# Patient Record
Sex: Male | Born: 1935 | Race: White | Hispanic: No | Smoking: Former smoker
Health system: Southern US, Community
[De-identification: ages and names within clinical notes are randomized; demographics above are authoritative.]

## PROBLEM LIST (undated history)

## (undated) DIAGNOSIS — I5030 Unspecified diastolic (congestive) heart failure: Secondary | ICD-10-CM

## (undated) DIAGNOSIS — J189 Pneumonia, unspecified organism: Secondary | ICD-10-CM

## (undated) DIAGNOSIS — G039 Meningitis, unspecified: Secondary | ICD-10-CM

## (undated) DIAGNOSIS — E662 Morbid (severe) obesity with alveolar hypoventilation: Secondary | ICD-10-CM

## (undated) DIAGNOSIS — I10 Essential (primary) hypertension: Secondary | ICD-10-CM

## (undated) DIAGNOSIS — N189 Chronic kidney disease, unspecified: Secondary | ICD-10-CM

## (undated) DIAGNOSIS — I452 Bifascicular block: Secondary | ICD-10-CM

## (undated) DIAGNOSIS — G061 Intraspinal abscess and granuloma: Secondary | ICD-10-CM

## (undated) DIAGNOSIS — M1990 Unspecified osteoarthritis, unspecified site: Secondary | ICD-10-CM

## (undated) DIAGNOSIS — I429 Cardiomyopathy, unspecified: Secondary | ICD-10-CM

## (undated) DIAGNOSIS — I4821 Permanent atrial fibrillation: Secondary | ICD-10-CM

## (undated) DIAGNOSIS — N183 Chronic kidney disease, stage 3 unspecified: Secondary | ICD-10-CM

## (undated) DIAGNOSIS — M4640 Discitis, unspecified, site unspecified: Secondary | ICD-10-CM

## (undated) DIAGNOSIS — I4819 Other persistent atrial fibrillation: Secondary | ICD-10-CM

## (undated) DIAGNOSIS — M1711 Unilateral primary osteoarthritis, right knee: Secondary | ICD-10-CM

## (undated) DIAGNOSIS — N390 Urinary tract infection, site not specified: Secondary | ICD-10-CM

## (undated) DIAGNOSIS — R55 Syncope and collapse: Secondary | ICD-10-CM

## (undated) DIAGNOSIS — S301XXA Contusion of abdominal wall, initial encounter: Secondary | ICD-10-CM

## (undated) DIAGNOSIS — R9389 Abnormal findings on diagnostic imaging of other specified body structures: Secondary | ICD-10-CM

## (undated) DIAGNOSIS — IMO0002 Reserved for concepts with insufficient information to code with codable children: Secondary | ICD-10-CM

## (undated) DIAGNOSIS — E785 Hyperlipidemia, unspecified: Secondary | ICD-10-CM

## (undated) DIAGNOSIS — I110 Hypertensive heart disease with heart failure: Secondary | ICD-10-CM

## (undated) DIAGNOSIS — A419 Sepsis, unspecified organism: Secondary | ICD-10-CM

## (undated) HISTORY — DX: Chronic kidney disease, unspecified: N18.9

## (undated) HISTORY — DX: Discitis, unspecified, site unspecified: M46.40

## (undated) HISTORY — DX: Urinary tract infection, site not specified: N39.0

## (undated) HISTORY — DX: Unilateral primary osteoarthritis, right knee: M17.11

## (undated) HISTORY — DX: Abnormal findings on diagnostic imaging of other specified body structures: R93.89

## (undated) HISTORY — DX: Bifascicular block: I45.2

## (undated) HISTORY — PX: PROSTATE SURGERY: SHX751

## (undated) HISTORY — PX: CARDIAC CATHETERIZATION: SHX172

## (undated) HISTORY — DX: Morbid (severe) obesity with alveolar hypoventilation: E66.2

## (undated) HISTORY — DX: Contusion of abdominal wall, initial encounter: S30.1XXA

## (undated) HISTORY — DX: Meningitis, unspecified: G03.9

## (undated) HISTORY — DX: Chronic kidney disease, stage 3 (moderate): N18.3

## (undated) HISTORY — DX: Other persistent atrial fibrillation: I48.19

## (undated) HISTORY — DX: Pneumonia, unspecified organism: J18.9

## (undated) HISTORY — DX: Hypertensive heart disease with heart failure: I11.0

## (undated) HISTORY — DX: Chronic kidney disease, stage 3 unspecified: N18.30

## (undated) HISTORY — DX: Syncope and collapse: R55

## (undated) HISTORY — PX: KNEE RECONSTRUCTION, MEDIAL PATELLAR FEMORAL LIGAMENT: SHX1898

## (undated) HISTORY — DX: Unspecified diastolic (congestive) heart failure: I50.30

## (undated) HISTORY — DX: Sepsis, unspecified organism: A41.9

## (undated) HISTORY — PX: REPLACEMENT TOTAL KNEE: SUR1224

## (undated) HISTORY — DX: Cardiomyopathy, unspecified: I42.9

## (undated) HISTORY — DX: Permanent atrial fibrillation: I48.21

## (undated) HISTORY — PX: HERNIA REPAIR: SHX51

---

## 1987-11-17 HISTORY — PX: KNEE ARTHROSCOPY: SUR90

## 2000-10-13 ENCOUNTER — Ambulatory Visit (HOSPITAL_COMMUNITY): Admission: RE | Admit: 2000-10-13 | Discharge: 2000-10-13 | Payer: Self-pay | Admitting: Specialist

## 2004-04-10 ENCOUNTER — Encounter: Admission: RE | Admit: 2004-04-10 | Discharge: 2004-04-10 | Payer: Self-pay | Admitting: Orthopaedic Surgery

## 2004-05-23 ENCOUNTER — Inpatient Hospital Stay (HOSPITAL_COMMUNITY): Admission: RE | Admit: 2004-05-23 | Discharge: 2004-05-27 | Payer: Self-pay | Admitting: Orthopaedic Surgery

## 2010-12-16 ENCOUNTER — Ambulatory Visit (HOSPITAL_COMMUNITY): Admission: RE | Admit: 2010-12-16 | Payer: Self-pay | Source: Home / Self Care | Admitting: Ophthalmology

## 2010-12-17 HISTORY — PX: EYE SURGERY: SHX253

## 2011-01-06 ENCOUNTER — Other Ambulatory Visit: Payer: Self-pay | Admitting: Ophthalmology

## 2011-01-06 ENCOUNTER — Observation Stay (HOSPITAL_COMMUNITY)
Admission: RE | Admit: 2011-01-06 | Discharge: 2011-01-07 | Disposition: A | Discharge status: Home / Self Care | Payer: Medicare Other | Source: Ambulatory Visit | Attending: Ophthalmology | Admitting: Ophthalmology

## 2011-01-06 ENCOUNTER — Ambulatory Visit (HOSPITAL_COMMUNITY): Payer: Medicare Other

## 2011-01-06 DIAGNOSIS — I10 Essential (primary) hypertension: Secondary | ICD-10-CM | POA: Insufficient documentation

## 2011-01-06 DIAGNOSIS — T8529XA Other mechanical complication of intraocular lens, initial encounter: Principal | ICD-10-CM | POA: Insufficient documentation

## 2011-01-06 DIAGNOSIS — Y849 Medical procedure, unspecified as the cause of abnormal reaction of the patient, or of later complication, without mention of misadventure at the time of the procedure: Secondary | ICD-10-CM | POA: Insufficient documentation

## 2011-01-06 LAB — BASIC METABOLIC PANEL
Calcium: 9.1 mg/dL (ref 8.4–10.5)
Chloride: 107 mEq/L (ref 96–112)
Creatinine, Ser: 1.28 mg/dL (ref 0.4–1.5)
GFR calc Af Amer: >60 mL/min (ref >60)
Glucose, Bld: 112 mg/dL — ABNORMAL HIGH (ref 70–99)
Sodium: 140 mEq/L (ref 135–145)

## 2011-01-06 LAB — CBC
Hemoglobin: 12.3 g/dL — ABNORMAL LOW (ref 13.0–17.0)
MCH: 29.6 pg (ref 26.0–34.0)
MCHC: 32.5 g/dL (ref 30.0–36.0)
RBC: 4.15 MIL/uL — ABNORMAL LOW (ref 4.22–5.81)
RDW: 13.8 % (ref 11.5–15.5)

## 2011-01-19 NOTE — Op Note (Signed)
NAME:  Andrew Salas, HUTMACHER NO.:  [id number]  MEDICAL RECORD NO.:  [id number]           PATIENT TYPE:  I  LOCATION:  5151                         FACILITY:  [facility]  PHYSICIAN:  Beulah Gandy. Ashley Royalty, M.D. DATE OF BIRTH:  1925-11-28  DATE OF PROCEDURE:  01/06/2011 DATE OF DISCHARGE:                              OPERATIVE REPORT   ADMISSION DIAGNOSIS:  Dislocated intraocular lens of left eye.  PROCEDURES:  Pars plana vitrectomy, removal of intraocular lens from the vitreous, secondary intraocular lens placement with suture, and retinal photocoagulation, all in the left eye.  SURGEON:  Beulah Gandy. Ashley Royalty, MD  ASSISTANT:  Rosalie Doctor, MA  ANESTHESIA:  General.  DETAILS:  After usual prep and drape, the indirect ophthalmoscope laser was moved into place, 660 burns placed around the retinal periphery around weak spots in the retina.  The power was 400 milliwatts, 1000 microns each and 0.05 seconds each.  There was a conjunctival peritomy performed from 8 o'clock to 4 o'clock.  Half-thickness scleral flaps were raised at 3 and 9 o'clock in anticipation of IOL suture.  25-gauge trocars placed at 10, 2, and 4 o'clock, infusion at 4 o'clock.  A 7-mm 3- layered corneoscleral wound was created from 10 o'clock to 2 o'clock. Pars plana vitrectomy was begun just behind the pupillary axis.  The intraocular lens was dislocated half into the vitreous and half ensnared in capsule.  The vitreous was trimmed from around the intraocular lens and it was allowed to be freely removed.  The corneal wound was opened with keratome.  20 gauge forceps were placed through the corneal wound and the implant was passed into the anterior chamber and out through the corneal wound.  Additional vitrectomy was carried out removing pigment granules and vitreous strands from the vitreous cavity.  The vitrectomy was carried out down to the periphery and the vitreous base was trimmed. No additional weak  retinal areas were seen.  Two 10-0 Prolene sutures were then passed beneath the scleral flaps behind the iris and anterior to the capsular remnants passed from 3 o'clock to 9 o'clock.  These sutures were externalized through the corneal wound.  A new intraocular lens was brought onto the field.  This was made by [employer] model CG70BD, power 22.0 D, length 12.5 mm, optic 7.0 mm, serial number [id number], expiration date May 2015.  The lens was brought onto the field, inspected and cleaned.  The Prolene sutures were attached to the eyelets of the lens.  The lens was passed into the anterior chamber, then into the posterior chamber.  It was dialed into place.  Prolene sutures were drawn securely beneath the scleral flaps and tied in multiple knots.  The ends were trimmed and the scleral flaps were allowed to lie over the knots.  The corneal wound was closed with 5 interrupted 10-0 nylon sutures.  Additional vitrectomy was carried out removing pigment granules and blood from the vitreous cavity in the retinal surface.  A 30% gas-fluid exchange was performed.  The instruments were removed from the eye.  The wounds were tested and found to be tight.  The conjunctiva was reposited with 7-0 chromic suture. Polymyxin and gentamicin were irrigated into the [place] space.  Marcaine was injected around the globe for postop pain.  Closing pressure was 10 with a Barraquer tonometer.  Polysporin ophthalmic ointment, patch, and shield were placed.  Decadron 10 mg was injected into the lower subconjunctival space.  The patient was awakened and taken to recovery in satisfactory condition.  COMPLICATIONS:  None.  DURATION:  One hour and 30 minutes.     Beulah Gandy. Ashley Royalty, M.D.     JDM/MEDQ  D:  01/06/2011  T:  01/07/2011  Job:  [id number]  Electronically Signed by Alan Mulder M.D. on 01/19/2011 06:45:08 AM

## 2011-04-03 NOTE — Op Note (Signed)
[place]  Patient:    Andrew Salas, Andrew Salas                     MRN: [id number] Proc. Date: 10/13/00 Adm. Date:  [id number] Disc. Date: [id number] Attending:  Mick Sell                           Operative Report  PREOPERATIVE DIAGNOSIS:  Cataract, right eye.  POSTOPERATIVE DIAGNOSIS:  Cataract, right eye.  OPERATION PERFORMED:  Cataract extraction with intraocular lens implant, right eye.  SURGEON:  Chucky May, M.D.  INDICATIONS FOR SURGERY:  The patient is a 77 year old male with painless progressive decrease in vision so that he has difficulty seeing for reading. On examination the patient was found to have a dense nuclear sclerotic and cortical cataract consistent with the decrease in visual acuity.  DESCRIPTION OF PROCEDURE:  The patient was brought to the main operating room and placed in supine position.  Anesthesia was obtained by means of topical 4% lidocaine drops with tetracaine.  The patient was then prepped and draped in the usual manner.  A lid speculum was inserted and the cornea was entered with a diamond keratome superiorly with an additional port superior temporally. OcuCoat was instilled and an anterior capsulorrhexis was performed without difficulty.  The nucleus was mobilized by hydrodissection, phacoemulsified, and residual cortical material removed by irrigation and aspiration.  The posterior capsule was polished and a posterior chamber lens implant was placed in the bag without difficulty.  OcuCoat was removed and replaced with balanced salt solution.  The wound was hydrated with balanced salt solution and checked for fluid leaks but none were noted.  The eye was dressed with topical Pred Forte, Ocuflox, Voltaren and a Fox shield and the patient was taken to the recovery room in excellent condition where he received written and verbal instructions for his postoperative care and was scheduled for follow up in  24 hours. DD:  11/04/00 TD:  11/04/00 Job: [id number]

## 2011-04-03 NOTE — Discharge Summary (Signed)
NAME:  Andrew Salas, Andrew Salas                        ACCOUNT NO.:  [id number]   MEDICAL RECORD NO.:  [id number]                   PATIENT TYPE:  INP   LOCATION:  5029                                 FACILITY:  [facility]   PHYSICIAN:  Mark C. Ophelia Charter, M.D.                 DATE OF BIRTH:  Mar 23, 1926   DATE OF ADMISSION:  05/23/2004  DATE OF DISCHARGE:  05/27/2004                                 DISCHARGE SUMMARY   ADMITTING DIAGNOSIS:  Right distal femur fibrous nonunion.   PERTINENT HISTORY:  Andrew Salas is a 77 year old male with a complaint of  right knee pain and distal femur pain.  He had had a fall injury getting out  of the bathtub back in 2002 and sustained a fracture of the distal femur.  He underwent surgical fixation in 2002, again in 2003 and again in 2004.  He  presented to [place] with complaints of right knee pain and distal femur pain.  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  On x-ray and plain films, there is evidence of angulation with this  nonunion.  A takedown of his nonunion and fixation using bone grafting and a  locking femoral plate were recommended and agreed upon.   PROCEDURES:  The patient underwent a right distal femoral fibrous nonunion  takedown and a fixation using plating and bone graft on May 23, 2004.  The  patient tolerated the procedure well.  There were no complications.  Please  see operative note for findings.  Surgeon -- Veverly Fells. Ophelia Charter, M.D., assistant  -- Sandrea Matte, P.A.   HOSPITAL COURSE:  The patient was admitted on May 23, 2004 and underwent a  right distal femur fibrous nonunion takedown, and fixation using plating and  bone graft.  On May 24, 2004, the patient's pain was well-controlled, vital  signs stable and the patient had good lower extremity strength and  sensation.  He was mobilized today up to a chair.  INR is 1.1.  The patient  was given 5 mg of Coumadin today.  On May 25, 2004, the patient  continued  to mobilize well.  Hemoglobin 8.7, hematocrit 25.5.  He was given 7.5 mg of  Coumadin today.  May 26, 2004, the patient continued to mobilize well on  his right leg nonweightbearing status.  He was getting around well with a  walker and his pain was well-controlled, his appetite was okay, and he was  doing well on p.o. pain medications.  Dressing was changed today.  The  patient had an enema yesterday but still has not had a bowel movement.  The  patient was given 10 mg of Coumadin today.  Labs:  Hemoglobin 8.3,  hematocrit 24.0.  INR 1.4.  On May 27, 2004, the patient continued to  mobilize very well in physical therapy.  He had a bowel movement, pain was  well-controlled  and he was discharged home with home health PT.   DISCHARGE MEDICATIONS:  1. Tylox 1-2 tablets p.o. q.4-6 h. p.r.n. pain.  2. Coumadin 7.5 mg x4 weeks.  3. Iron sulfate 325 mg 1 tab p.o. b.i.d. with meals.  4. Resume home medications.   SPECIAL INSTRUCTIONS:  The patient is to be strictly nonweightbearing on his  right leg while at home.  He will need home health physical therapy and home  health R.N.  Therapy will be 3 times a week for approximately 2-3 weeks and  home health R.N. for PT/INR blood draw per Coumadin protocol.   FOLLOWUP:  He will follow up with Dr. Ophelia Charter in 2 weeks postop for recheck  and for staples out.   CONDITION AT DISCHARGE:  Stable.   DISCHARGE DISPOSITION:  To home.   DISCHARGE DIAGNOSIS:  Status post open reduction and internal fixation,  right distal femur fibrous nonunion.      Sandrea Matte, P.A.                       Mark C. Ophelia Charter, M.D.    JH/MEDQ  D:  06/23/2004  T:  06/24/2004  Job:  [id number]

## 2011-04-03 NOTE — Op Note (Signed)
NAME:  Andrew Salas, Andrew Salas                        ACCOUNT NO.:  [id number]   MEDICAL RECORD NO.:  [id number]                   PATIENT TYPE:  INP   LOCATION:  5029                                 FACILITY:  [facility]   PHYSICIAN:  Mark C. Ophelia Charter, M.D.                 DATE OF BIRTH:  26-Jul-1926   DATE OF PROCEDURE:  05/23/2004  DATE OF DISCHARGE:                                 OPERATIVE REPORT   PREOPERATIVE DIAGNOSES:  Right femoral fibrous nonunion with varus  deformity.   POSTOPERATIVE DIAGNOSES:  Right femoral fibrous nonunion with varus  deformity.   PROCEDURE:  Right femur takedown of nonunion, correction of deformity and  medial and lateral plate fixation.   SURGEON:  Mark C. Ophelia Charter, M.D.   ASSISTANT:  Burnard Bunting, M.D.   SECOND ASSISTANT:  Sandrea Matte, P.A.   ANESTHESIA:  GOT.   TOURNIQUET TIME:  2 hours.   ESTIMATED BLOOD LOSS:  500 mL.   DESCRIPTION OF PROCEDURE:  After induction of general anesthesia, oral  tracheal intubation, preoperative antibiotic prophylaxis, standard prepping  and draping was performed all the way up to the groin giving room for a  sterile tourniquet which was applied, green towel applied over it, sterile  skin marker __________ incisions.  He had had three lateral incisions  basically on the same line and two medial. A lateral incision was made and  exposure through thick scar tissue was performed and the leg was wrapped in  esmarch prior to tourniquet inflation. Lateral cortex of the femur was  identified, C-arm was brought in for confirmation at the appropriate level  of the fibrous nonunion. CT cuts were in the room and the tortuous fibrous  nonunion route was carefully followed, threaded out, Beyer rongeur was used  and meticulous debridement of fibrous __________ is performed until there  was gross instability of the fracture.  It was corrected and a [employer] distal femoral locking plate was selected.  The distal five  locking holes were filled with locking screws.  Proximal screws were filled  with a total of six locking screws with bicortical fit for a total of 12  cortices proximal. Two other screws had been placed but these two screws had  to be removed when the medial incision was opened up and medial plate was  applied.  This was a seven hole plate and distally some unicortical screws  had to be applied due to contact with the other distal screws from the  lateral plate. Bicortical fixation was obtained proximally.  There was  excellent stability, good position AP and lateral, all screws were checked.  The tourniquet was deflated after two hours when the medial plate was being  applied. Hemovac was placed in the lateral incision and entered the medial  incision as well in the supracondylar region. There was a large spur in the  supracondylar region that abutted the patella  and this was also removed.  Portions of the external cortex of the femur laterally had to be chiseled  away with an osteotome in order to get a flat surface and correct some of  the deformity for the plate to fit securely and the plate had to be bent on  both medial and lateral side with a large plate bender.  Overall alignment  and fixation was excellent.  A symphony bone graft was used with 30 mL of  cancellous chips mixed together and packed meticulously in both medial and  lateral areas.  A Hemovac was not hooked up to suction for 10 minutes into  the recovery room. Deep tissues was closed with #1  nonabsorbable sutures, #0 Vicryl in the subcutaneous tissue. The tensor  fascia was meticulously closed, 2-0 Vicryl and subcu tissue, skin stapled  closure, postop dressing with knee immobilizer. Sponge count and needle  count was correct.                                               Mark C. Ophelia Charter, M.D.    MCY/MEDQ  D:  05/27/2004  T:  05/27/2004  Job:  [id number]

## 2011-12-17 ENCOUNTER — Other Ambulatory Visit (HOSPITAL_COMMUNITY): Payer: Self-pay | Admitting: Orthopaedic Surgery

## 2011-12-18 ENCOUNTER — Other Ambulatory Visit (HOSPITAL_COMMUNITY): Payer: Self-pay | Admitting: Orthopaedic Surgery

## 2011-12-24 ENCOUNTER — Other Ambulatory Visit: Payer: Self-pay

## 2011-12-24 ENCOUNTER — Encounter (HOSPITAL_COMMUNITY): Payer: Self-pay

## 2011-12-24 ENCOUNTER — Encounter (HOSPITAL_COMMUNITY): Payer: Self-pay | Admitting: Pharmacy Technician

## 2011-12-24 ENCOUNTER — Encounter (HOSPITAL_COMMUNITY)
Admission: RE | Admit: 2011-12-24 | Discharge: 2011-12-24 | Disposition: A | Discharge status: Home / Self Care | Payer: Medicare Other | Source: Ambulatory Visit | Attending: Orthopaedic Surgery | Admitting: Orthopaedic Surgery

## 2011-12-24 HISTORY — DX: Unspecified osteoarthritis, unspecified site: M19.90

## 2011-12-24 HISTORY — DX: Reserved for concepts with insufficient information to code with codable children: IMO0002

## 2011-12-24 HISTORY — DX: Essential (primary) hypertension: I10

## 2011-12-24 HISTORY — DX: Hyperlipidemia, unspecified: E78.5

## 2011-12-24 LAB — SURGICAL PCR SCREEN
MRSA, PCR: NEGATIVE
Staphylococcus aureus: NEGATIVE

## 2011-12-24 LAB — URINALYSIS, ROUTINE W REFLEX MICROSCOPIC
Hgb urine dipstick: NEGATIVE
Nitrite: POSITIVE — AB
Specific Gravity, Urine: 1.02 (ref 1.005–1.030)
Urobilinogen, UA: 1 mg/dL (ref 0.0–1.0)

## 2011-12-24 LAB — COMPREHENSIVE METABOLIC PANEL
Albumin: 4 g/dL (ref 3.5–5.2)
Alkaline Phosphatase: 75 U/L (ref 39–117)
BUN: 35 mg/dL — ABNORMAL HIGH (ref 6–23)
Potassium: 4.5 mEq/L (ref 3.5–5.1)
Total Protein: 7.2 g/dL (ref 6.0–8.3)

## 2011-12-24 LAB — CBC
HCT: 38.8 % — ABNORMAL LOW (ref 39.0–52.0)
MCHC: 32 g/dL (ref 30.0–36.0)
RDW: 14.2 % (ref 11.5–15.5)

## 2011-12-24 LAB — PROTIME-INR
INR: 1.04 (ref 0.00–1.49)
Prothrombin Time: 13.8 seconds (ref 11.6–15.2)

## 2011-12-24 LAB — APTT: aPTT: 31 seconds (ref 24–37)

## 2011-12-24 MED ORDER — CEFAZOLIN SODIUM-DEXTROSE 2-3 GM-% IV SOLR
2.0000 g | INTRAVENOUS | Status: AC
  Administered 2011-12-25: 2 g via INTRAVENOUS
  Filled 2011-12-24: qty 50

## 2011-12-24 NOTE — Consult Note (Addendum)
Anesthesia:  Patient is a 78 year old male scheduled for hardware removal, right femur.  His PAT appointment was earlier today.  His history includes HLD, OA, HTN, former smoker, prior knee and prostate surgery.  Labs noted.  UA is positive for leukocytes, nitrites.  Result called to Dr. Ophelia Charter earlier today.    CXR from 01/06/11 showed:  1. No acute cardiopulmonary abnormalities.  2. Hiatal hernia.  3. Indeterminate nodule within the right midlung. Suggest  followup with non emergent noncontrast CT of the chest.  His CXR from today is still pending.  EKG shows SB at 56 bpm with PACs, right BBB.  He saw Cardiologist Dr. Woodfin Ganja at [facility]) on 12/03/10 and had a right BBB at that time.  Dr. Primitivo Gauze note does not detail a plan, and his office says they do not have any additional tests to send and that he is no longer a patient there.  Patient told his PAT nurse that the Cardiologist did not recommend any further testing or specific follow-up.  He told the PAT nurse that he had a cardiac cath approximately 5 of 6 years ago at [facility].  These records are still pending.  I've also asked her to request recent records from his PCP Dr. Tomasa Blase.    I'll follow-up with his records tomorrow as available.  Of note, he denied an CV symptoms to his PAT nurse, and reported he was quite active with hopes to be more so once he recovers from this procedure.  Addendum: 12/25/11 0930  Yesterday's CXR showed no acute abnormalities. Huge hiatal hernia.  Received cardiac cath report from [facility] done on 10/29/06.  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    Patient denies CP, SOB, edema.  His activity is somewhat limited due to leg pain.   Exam shows heart with RRR with occasional ectopic beat, lungs clear, no ankle edema.  Anticipate he can proceed.  I've asked his Anesthesiologist to review patient's last two CXR  reports with Dr. Ophelia Charter.  Of, note Mr. Daphine Deutscher reports that he will be scheduled for a TKA within the next few weeks.

## 2011-12-24 NOTE — Pre-Procedure Instructions (Signed)
[address]  12/24/2011   Your procedure is scheduled on:  Friday, February 8th.   Report to [facility] at   8:50 AM.   Call this number if you have problems the morning of surgery: [phone]   Remember:   Do not eat food:After Midnight.   May have clear liquids: up to 4 Hours before arrival.   Clear liquids include soda, tea, black coffee, apple or grape juice, broth.   Take these medicines the morning of surgery with A SIP OF WATER: Amlodipine and may use eye drops.   Do not wear jewelry, make-up or nail polish.  Do not wear lotions, powders, or perfumes. You may wear deodorant.  Do not shave 48 hours prior to surgery.  Do not bring valuables to the hospital.  Contacts, dentures or bridgework may not be worn into surgery.  Leave suitcase in the car. After surgery it may be brought to your room.  For patients admitted to the hospital, checkout time is 11:00 AM the day of discharge.   Patients discharged the day of surgery will not be allowed to drive home.  Name and phone number of your driver: ---  Special Instructions: CHG Shower Use Special Wash: 1/2 bottle night before surgery and 1/2 bottle morning of surgery.   Please read over the following fact sheets that you were given: Pain Booklet, Coughing and Deep Breathing, MRSA Information and Surgical Site Infection Prevention

## 2011-12-25 ENCOUNTER — Ambulatory Visit (HOSPITAL_COMMUNITY)
Admission: RE | Admit: 2011-12-25 | Discharge: 2011-12-26 | Disposition: A | Discharge status: Home / Self Care | Payer: Medicare Other | Source: Ambulatory Visit | Attending: Orthopaedic Surgery | Admitting: Orthopaedic Surgery

## 2011-12-25 ENCOUNTER — Encounter (HOSPITAL_COMMUNITY): Payer: Self-pay | Admitting: Vascular Surgery

## 2011-12-25 ENCOUNTER — Encounter (HOSPITAL_COMMUNITY): Payer: Self-pay | Admitting: *Deleted

## 2011-12-25 ENCOUNTER — Ambulatory Visit (HOSPITAL_COMMUNITY): Payer: Medicare Other | Admitting: Vascular Surgery

## 2011-12-25 ENCOUNTER — Ambulatory Visit (HOSPITAL_COMMUNITY): Payer: Medicare Other

## 2011-12-25 ENCOUNTER — Encounter (HOSPITAL_COMMUNITY)
Admission: RE | Disposition: A | Discharge status: Home / Self Care | Payer: Self-pay | Source: Ambulatory Visit | Attending: Orthopaedic Surgery

## 2011-12-25 DIAGNOSIS — I10 Essential (primary) hypertension: Secondary | ICD-10-CM | POA: Insufficient documentation

## 2011-12-25 DIAGNOSIS — M1710 Unilateral primary osteoarthritis, unspecified knee: Secondary | ICD-10-CM | POA: Insufficient documentation

## 2011-12-25 DIAGNOSIS — Z01812 Encounter for preprocedural laboratory examination: Secondary | ICD-10-CM | POA: Insufficient documentation

## 2011-12-25 DIAGNOSIS — K08409 Partial loss of teeth, unspecified cause, unspecified class: Secondary | ICD-10-CM | POA: Insufficient documentation

## 2011-12-25 DIAGNOSIS — M1731 Unilateral post-traumatic osteoarthritis, right knee: Secondary | ICD-10-CM

## 2011-12-25 DIAGNOSIS — Z472 Encounter for removal of internal fixation device: Secondary | ICD-10-CM | POA: Insufficient documentation

## 2011-12-25 DIAGNOSIS — E785 Hyperlipidemia, unspecified: Secondary | ICD-10-CM | POA: Insufficient documentation

## 2011-12-25 DIAGNOSIS — Z87891 Personal history of nicotine dependence: Secondary | ICD-10-CM | POA: Insufficient documentation

## 2011-12-25 HISTORY — PX: HARDWARE REMOVAL: SHX979

## 2011-12-25 SURGERY — REMOVAL, HARDWARE
Anesthesia: General | Site: Leg Upper | Laterality: Right | Wound class: Clean

## 2011-12-25 MED ORDER — PNEUMOCOCCAL VAC POLYVALENT 25 MCG/0.5ML IJ INJ
0.5000 mL | INJECTION | INTRAMUSCULAR | Status: AC
  Administered 2011-12-26: 0.5 mL via INTRAMUSCULAR
  Filled 2011-12-25: qty 0.5

## 2011-12-25 MED ORDER — HYDROCHLOROTHIAZIDE 25 MG PO TABS
25.0000 mg | ORAL_TABLET | Freq: Every day | ORAL | Status: DC
  Administered 2011-12-25: 25 mg via ORAL
  Filled 2011-12-25 (×2): qty 1

## 2011-12-25 MED ORDER — LACTATED RINGERS IV SOLN
INTRAVENOUS | Status: DC
  Administered 2011-12-25: 10:00:00 via INTRAVENOUS
  Administered 2011-12-25: 50 mL/h via INTRAVENOUS

## 2011-12-25 MED ORDER — PREDNISOLONE ACETATE 1 % OP SUSP
1.0000 [drp] | Freq: Three times a day (TID) | OPHTHALMIC | Status: DC
  Administered 2011-12-25: 1 [drp] via OPHTHALMIC
  Filled 2011-12-25: qty 1

## 2011-12-25 MED ORDER — PRESERVISION AREDS 2 PO CAPS: 1.0000 | ORAL_CAPSULE | Freq: Three times a day (TID) | ORAL | Status: DC

## 2011-12-25 MED ORDER — BENAZEPRIL HCL 40 MG PO TABS
40.0000 mg | ORAL_TABLET | Freq: Every day | ORAL | Status: DC
  Administered 2011-12-25: 40 mg via ORAL
  Filled 2011-12-25 (×2): qty 1

## 2011-12-25 MED ORDER — FENTANYL CITRATE 0.05 MG/ML IJ SOLN
INTRAMUSCULAR | Status: DC | PRN
  Administered 2011-12-25: 100 ug via INTRAVENOUS
  Administered 2011-12-25: 50 ug via INTRAVENOUS
  Administered 2011-12-25: 100 ug via INTRAVENOUS

## 2011-12-25 MED ORDER — ONDANSETRON HCL 4 MG/2ML IJ SOLN
4.0000 mg | Freq: Four times a day (QID) | INTRAMUSCULAR | Status: DC | PRN
  Administered 2011-12-26: 4 mg via INTRAVENOUS
  Filled 2011-12-25: qty 2

## 2011-12-25 MED ORDER — NITROGLYCERIN 0.4 MG SL SUBL: 0.4000 mg | SUBLINGUAL_TABLET | SUBLINGUAL | Status: DC | PRN

## 2011-12-25 MED ORDER — LACTATED RINGERS IV SOLN
INTRAVENOUS | Status: DC | PRN
  Administered 2011-12-25 (×2): via INTRAVENOUS

## 2011-12-25 MED ORDER — HYPROMELLOSE (GONIOSCOPIC) 2.5 % OP SOLN
1.0000 [drp] | Freq: Three times a day (TID) | OPHTHALMIC | Status: DC | PRN
  Filled 2011-12-25: qty 15

## 2011-12-25 MED ORDER — SIMVASTATIN 20 MG PO TABS
20.0000 mg | ORAL_TABLET | Freq: Every day | ORAL | Status: DC
  Filled 2011-12-25: qty 1

## 2011-12-25 MED ORDER — PROPOFOL 10 MG/ML IV EMUL
INTRAVENOUS | Status: DC | PRN
  Administered 2011-12-25: 100 mg via INTRAVENOUS

## 2011-12-25 MED ORDER — CARBOXYMETHYLCELLULOSE SODIUM 0.5 % OP SOLN
1.0000 [drp] | Freq: Three times a day (TID) | OPHTHALMIC | Status: DC | PRN
Start: 2011-12-25 — End: 2011-12-25

## 2011-12-25 MED ORDER — HYDROCODONE-ACETAMINOPHEN 5-325 MG PO TABS
1.0000 | ORAL_TABLET | ORAL | Status: DC | PRN
  Administered 2011-12-26 (×2): 2 via ORAL
  Filled 2011-12-25 (×2): qty 2

## 2011-12-25 MED ORDER — LACTATED RINGERS IV SOLN: INTRAVENOUS | Status: DC

## 2011-12-25 MED ORDER — AMLODIPINE BESYLATE 10 MG PO TABS
10.0000 mg | ORAL_TABLET | Freq: Every day | ORAL | Status: DC
Start: 2011-12-25 — End: 2011-12-26
  Filled 2011-12-25 (×2): qty 1

## 2011-12-25 MED ORDER — SIMVASTATIN 20 MG PO TABS
20.0000 mg | ORAL_TABLET | Freq: Every day | ORAL | Status: DC
  Filled 2011-12-25 (×2): qty 1

## 2011-12-25 MED ORDER — 0.9 % SODIUM CHLORIDE (POUR BTL) OPTIME
TOPICAL | Status: DC | PRN
  Administered 2011-12-25: 1000 mL

## 2011-12-25 MED ORDER — ASPIRIN EC 81 MG PO TBEC
81.0000 mg | DELAYED_RELEASE_TABLET | Freq: Every day | ORAL | Status: DC
  Administered 2011-12-25: 81 mg via ORAL
  Filled 2011-12-25 (×2): qty 1

## 2011-12-25 MED ORDER — GLYCOPYRROLATE 0.2 MG/ML IJ SOLN
INTRAMUSCULAR | Status: DC | PRN
  Administered 2011-12-25: 0.2 mg via INTRAVENOUS

## 2011-12-25 MED ORDER — CEFAZOLIN SODIUM 1-5 GM-% IV SOLN
1.0000 g | Freq: Four times a day (QID) | INTRAVENOUS | Status: AC
Start: 2011-12-25 — End: 2011-12-25
  Administered 2011-12-25: 1 g via INTRAVENOUS
  Filled 2011-12-25: qty 50

## 2011-12-25 MED ORDER — MORPHINE SULFATE 2 MG/ML IJ SOLN
1.0000 mg | INTRAMUSCULAR | Status: DC | PRN
  Administered 2011-12-25: 1 mg via INTRAVENOUS
  Filled 2011-12-25: qty 1

## 2011-12-25 MED ORDER — HYDROMORPHONE HCL PF 1 MG/ML IJ SOLN
0.2500 mg | INTRAMUSCULAR | Status: DC | PRN
  Administered 2011-12-25 (×4): 0.5 mg via INTRAVENOUS

## 2011-12-25 MED ORDER — METOCLOPRAMIDE HCL 10 MG PO TABS: 5.0000 mg | ORAL_TABLET | Freq: Three times a day (TID) | ORAL | Status: DC | PRN

## 2011-12-25 MED ORDER — LOTEPREDNOL ETABONATE 0.5 % OP SUSP
1.0000 [drp] | Freq: Three times a day (TID) | OPHTHALMIC | Status: DC
  Filled 2011-12-25: qty 5

## 2011-12-25 MED ORDER — METHOCARBAMOL 500 MG PO TABS: 500.0000 mg | ORAL_TABLET | Freq: Four times a day (QID) | ORAL | Status: DC | PRN

## 2011-12-25 MED ORDER — OXYCODONE-ACETAMINOPHEN 5-325 MG PO TABS
1.0000 | ORAL_TABLET | ORAL | Status: DC | PRN
  Administered 2011-12-25: 2 via ORAL
  Filled 2011-12-25: qty 2

## 2011-12-25 MED ORDER — METHOCARBAMOL 100 MG/ML IJ SOLN
500.0000 mg | Freq: Four times a day (QID) | INTRAVENOUS | Status: DC | PRN
  Filled 2011-12-25: qty 5

## 2011-12-25 MED ORDER — ONDANSETRON HCL 4 MG/2ML IJ SOLN
INTRAMUSCULAR | Status: DC | PRN
  Administered 2011-12-25: 4 mg via INTRAVENOUS

## 2011-12-25 MED ORDER — EPHEDRINE SULFATE 50 MG/ML IJ SOLN
INTRAMUSCULAR | Status: DC | PRN
  Administered 2011-12-25: 10 mg via INTRAVENOUS
  Administered 2011-12-25: 20 mg via INTRAVENOUS
  Administered 2011-12-25: 10 mg via INTRAVENOUS

## 2011-12-25 MED ORDER — BUPIVACAINE-EPINEPHRINE 0.25% -1:200000 IJ SOLN
INTRAMUSCULAR | Status: DC | PRN
  Administered 2011-12-25: 20 mL

## 2011-12-25 MED ORDER — PROSIGHT PO TABS
1.0000 | ORAL_TABLET | Freq: Three times a day (TID) | ORAL | Status: DC
  Administered 2011-12-25 (×2): 1 via ORAL
  Filled 2011-12-25 (×5): qty 1

## 2011-12-25 MED ORDER — POLYVINYL ALCOHOL 1.4 % OP SOLN
1.0000 [drp] | Freq: Three times a day (TID) | OPHTHALMIC | Status: DC | PRN
  Filled 2011-12-25: qty 15

## 2011-12-25 MED ORDER — ONDANSETRON HCL 4 MG PO TABS: 4.0000 mg | ORAL_TABLET | Freq: Four times a day (QID) | ORAL | Status: DC | PRN

## 2011-12-25 MED ORDER — PROMETHAZINE HCL 25 MG/ML IJ SOLN: 6.2500 mg | INTRAMUSCULAR | Status: DC | PRN

## 2011-12-25 MED ORDER — METOCLOPRAMIDE HCL 5 MG/ML IJ SOLN
5.0000 mg | Freq: Three times a day (TID) | INTRAMUSCULAR | Status: DC | PRN
  Filled 2011-12-25: qty 2

## 2011-12-25 MED ORDER — HYDROMORPHONE HCL PF 1 MG/ML IJ SOLN
INTRAMUSCULAR | Status: AC
  Filled 2011-12-25: qty 1

## 2011-12-25 MED ORDER — MIDAZOLAM HCL 5 MG/5ML IJ SOLN
INTRAMUSCULAR | Status: DC | PRN
  Administered 2011-12-25: 2 mg via INTRAVENOUS

## 2011-12-25 SURGICAL SUPPLY — 49 items
BANDAGE ELASTIC 4 VELCRO ST LF (GAUZE/BANDAGES/DRESSINGS) IMPLANT
BANDAGE ELASTIC 6 VELCRO ST LF (GAUZE/BANDAGES/DRESSINGS) IMPLANT
BANDAGE ESMARK 6X9 LF (GAUZE/BANDAGES/DRESSINGS) IMPLANT
BANDAGE GAUZE ELAST BULKY 4 IN (GAUZE/BANDAGES/DRESSINGS) ×2 IMPLANT
BNDG CMPR 9X6 STRL LF SNTH (GAUZE/BANDAGES/DRESSINGS)
BNDG COHESIVE 4X5 TAN STRL (GAUZE/BANDAGES/DRESSINGS) IMPLANT
BNDG ESMARK 6X9 LF (GAUZE/BANDAGES/DRESSINGS)
CLOTH BEACON ORANGE TIMEOUT ST (SAFETY) ×2 IMPLANT
COVER SURGICAL LIGHT HANDLE (MISCELLANEOUS) ×2 IMPLANT
DRAPE C-ARM 42X72 X-RAY (DRAPES) IMPLANT
DRAPE EXTREMITY T 121X128X90 (DRAPE) IMPLANT
DRAPE INCISE IOBAN 66X45 STRL (DRAPES) IMPLANT
DRAPE ORTHO SPLIT 77X108 STRL (DRAPES)
DRAPE PROXIMA HALF (DRAPES) IMPLANT
DRAPE SURG ORHT 6 SPLT 77X108 (DRAPES) IMPLANT
DRSG ADAPTIC 3X8 NADH LF (GAUZE/BANDAGES/DRESSINGS) ×2 IMPLANT
DRSG EMULSION OIL 3X3 NADH (GAUZE/BANDAGES/DRESSINGS) ×2 IMPLANT
DRSG PAD ABDOMINAL 8X10 ST (GAUZE/BANDAGES/DRESSINGS) ×2 IMPLANT
ELECT REM PT RETURN 9FT ADLT (ELECTROSURGICAL) ×2
ELECTRODE REM PT RTRN 9FT ADLT (ELECTROSURGICAL) ×1 IMPLANT
GAUZE SPONGE 4X4 12PLY STRL LF (GAUZE/BANDAGES/DRESSINGS) ×2 IMPLANT
GLOVE BIOGEL PI IND STRL 7.5 (GLOVE) ×1 IMPLANT
GLOVE BIOGEL PI IND STRL 8 (GLOVE) ×1 IMPLANT
GLOVE BIOGEL PI INDICATOR 7.5 (GLOVE) ×1
GLOVE BIOGEL PI INDICATOR 8 (GLOVE) ×1
GLOVE ECLIPSE 7.0 STRL STRAW (GLOVE) ×2 IMPLANT
GLOVE ORTHO TXT STRL SZ7.5 (GLOVE) ×2 IMPLANT
GOWN PREVENTION PLUS LG XLONG (DISPOSABLE) IMPLANT
GOWN STRL NON-REIN LRG LVL3 (GOWN DISPOSABLE) ×6 IMPLANT
KIT BASIN OR (CUSTOM PROCEDURE TRAY) ×2 IMPLANT
KIT ROOM TURNOVER OR (KITS) ×2 IMPLANT
MANIFOLD NEPTUNE II (INSTRUMENTS) ×2 IMPLANT
NS IRRIG 1000ML POUR BTL (IV SOLUTION) ×2 IMPLANT
PACK GENERAL/GYN (CUSTOM PROCEDURE TRAY) ×2 IMPLANT
PAD ARMBOARD 7.5X6 YLW CONV (MISCELLANEOUS) ×4 IMPLANT
PAD CAST 4YDX4 CTTN HI CHSV (CAST SUPPLIES) ×1 IMPLANT
PADDING CAST COTTON 4X4 STRL (CAST SUPPLIES) ×2
SPONGE GAUZE 4X4 12PLY (GAUZE/BANDAGES/DRESSINGS) ×2 IMPLANT
STAPLER VISISTAT 35W (STAPLE) ×2 IMPLANT
STOCKINETTE IMPERVIOUS 9X36 MD (GAUZE/BANDAGES/DRESSINGS) IMPLANT
SUT ETHILON 4 0 FS 1 (SUTURE) IMPLANT
SUT VIC AB 0 CT1 27 (SUTURE)
SUT VIC AB 0 CT1 27XBRD ANBCTR (SUTURE) IMPLANT
SUT VIC AB 2-0 CT1 27 (SUTURE)
SUT VIC AB 2-0 CT1 TAPERPNT 27 (SUTURE) IMPLANT
TAPE CLOTH SURG 6X10 WHT LF (GAUZE/BANDAGES/DRESSINGS) ×2 IMPLANT
TOWEL OR 17X24 6PK STRL BLUE (TOWEL DISPOSABLE) ×2 IMPLANT
TOWEL OR 17X26 10 PK STRL BLUE (TOWEL DISPOSABLE) ×2 IMPLANT
WATER STERILE IRR 1000ML POUR (IV SOLUTION) ×2 IMPLANT

## 2011-12-25 NOTE — Interval H&P Note (Signed)
History and Physical Interval Note:  12/25/2011 10:55 AM  Andrew Salas  has presented today for surgery, with the diagnosis of Right Knee Osteoarthritis Retained Hardware  The various methods of treatment have been discussed with the patient and family. After consideration of risks, benefits and other options for treatment, the patient has consented to  Procedure(s): HARDWARE REMOVAL as a surgical intervention .  The patients' history has been reviewed, patient examined, no change in status, stable for surgery.  I have reviewed the patients' chart and labs.  Questions were answered to the patient's satisfaction.     Sonora Catlin C

## 2011-12-25 NOTE — Transfer of Care (Signed)
Immediate Anesthesia Transfer of Care Note  Patient: Andrew Salas  Procedure(s) Performed:  HARDWARE REMOVAL -  Hardware Removal Right Femur  Patient Location: PACU  Anesthesia Type: General  Level of Consciousness: awake, alert  and oriented  Airway & Oxygen Therapy: Patient Spontanous Breathing and Patient connected to nasal cannula oxygen  Post-op Assessment: Report given to PACU RN, Post -op Vital signs reviewed and stable and Patient moving all extremities  Post vital signs: Reviewed and stable  Complications: No apparent anesthesia complications

## 2011-12-25 NOTE — Brief Op Note (Signed)
12/25/2011  12:30 PM  PATIENT:  Andrew Salas  78 y.o. male  PRE-OPERATIVE DIAGNOSIS:  Right Knee Osteoarthritis Retained Hardware  POST-OPERATIVE DIAGNOSIS:  Right knee osteoarthritis with retained hardware   PROCEDURE:  Procedure(s): HARDWARE REMOVALmedial and lateral screws from distla femur  SURGEON:  Surgeon(s): Eldred Manges, MD  PHYSICIAN ASSISTANT:   ASSISTANTS: none   ANESTHESIA:   local and general  EBL:  Total I/O In: 1000 [I.V.:1000] Out: -   BLOOD ADMINISTERED:none  DRAINS: none   LOCAL MEDICATIONS USED:  MARCAINE 20CC  SPECIMEN:  No Specimen  DISPOSITION OF SPECIMEN:  N/A  COUNTS:  YES  TOURNIQUET:   Total Tourniquet Time Documented: Thigh (Right) - 35 minutes  DICTATION: .Other Dictation: Dictation Number [id number]  PLAN OF CARE: Admit for overnight observation  PATIENT DISPOSITION:  PACU - hemodynamically stable.   Delay start of Pharmacological VTE agent (>24hrs) due to surgical blood loss or risk of bleeding:  {YES/NO/NOT APPLICABLE:20182

## 2011-12-25 NOTE — Anesthesia Preprocedure Evaluation (Signed)
Anesthesia Evaluation  Patient identified by MRN, date of birth, ID band Patient awake    Reviewed: Allergy & Precautions, H&P , NPO status , Patient's Chart, lab work & pertinent test results  History of Anesthesia Complications Negative for: history of anesthetic complications  Airway Mallampati: I TM Distance: >3 FB Neck ROM: Full    Dental  (+) Edentulous Upper, Partial Lower and Dental Advisory Given   Pulmonary neg pulmonary ROS, former smoker (quit 35 years ago) clear to auscultation  Pulmonary exam normal       Cardiovascular hypertension, Pt. on medications Regular Normal Cath '07: non-obstructive ASCAD, normal LVF   Neuro/Psych Negative Neurological ROS  Negative Psych ROS   GI/Hepatic negative GI ROS, Neg liver ROS,   Endo/Other  Negative Endocrine ROS  Renal/GU negative Renal ROS     Musculoskeletal   Abdominal (+) obese,   Peds  Hematology negative hematology ROS (+)   Anesthesia Other Findings   Reproductive/Obstetrics                           Anesthesia Physical Anesthesia Plan  ASA: II  Anesthesia Plan: General   Post-op Pain Management:    Induction: Intravenous  Airway Management Planned: LMA  Additional Equipment:   Intra-op Plan:   Post-operative Plan:   Informed Consent: I have reviewed the patients History and Physical, chart, labs and discussed the procedure including the risks, benefits and alternatives for the proposed anesthesia with the patient or authorized representative who has indicated his/her understanding and acceptance.   Dental advisory given  Plan Discussed with: CRNA and Surgeon  Anesthesia Plan Comments: (Plan routine monitors, GA- LMA OK)        Anesthesia Quick Evaluation

## 2011-12-25 NOTE — H&P (Signed)
Andrew Salas is an 78 y.o. male.   Chief Complaint: right knee OA  For hardware removal right femur for later TKA HPI: 78yo male  2005 right distal femur medial and lateral compression plate fixation for nonunion femur Fx treated elsewhere. Bone grafted and plated by Dr. Ophelia Charter with healing of fracture. He has right knee OA and has periarticular locking screws that prevent TKA at this time and he is brought in for screw removal as first stage procedure for later TKA in a few weeks.   Past Medical History  Diagnosis Date  . Hyperlipidemia   . Ulcer     stomach 60 years ago- no current problem  . Arthritis     R knee  . Hypertension     Dr Dulce Sellar in [place] is pt's cardiologist.    Past Surgical History  Procedure Date  . Knee arthroscopy     Left  . Knee arthroscopy 1989    Right  . Eye surgery 12/2010    Cataract bil with lens implant  . Replacement total knee   . Knee reconstruction, medial patellar femoral ligament     "screws and pins inserted" from a fracture  . Prostate surgery     years ago "the Dr microwaved it"  . Hernia repair     per chest X- Ray    Family History  Problem Relation Age of Onset  . Anesthesia problems Neg Hx    Social History:  reports that he has quit smoking. He quit smokeless tobacco use about 61 years ago. He reports that he drinks about 3.6 ounces of alcohol per week. He reports that he does not use illicit drugs.  Allergies: No Known Allergies  Medications Prior to Admission  Medication Dose Route Frequency Provider Last Rate Last Dose  . ceFAZolin (ANCEF) IVPB 2 g/50 mL premix  2 g Intravenous 60 min Pre-Op Eldred Manges, MD      . lactated ringers infusion   Intravenous Continuous Germaine Pomfret, MD 50 mL/hr at 12/25/11 1015     No current outpatient prescriptions on file as of 12/25/2011.    Results for orders placed during the hospital encounter of 12/24/11 (from the past 48 hour(s))  URINALYSIS, ROUTINE W REFLEX MICROSCOPIC      Status: Abnormal   Collection Time   12/24/11  1:59 PM      Component Value Range Comment   Color, Urine YELLOW  YELLOW     APPearance CLOUDY (*) CLEAR     Specific Gravity, Urine 1.020  1.005 - 1.030     pH 5.5  5.0 - 8.0     Glucose, UA NEGATIVE  NEGATIVE (mg/dL)    Hgb urine dipstick NEGATIVE  NEGATIVE     Bilirubin Urine NEGATIVE  NEGATIVE     Ketones, ur 15 (*) NEGATIVE (mg/dL)    Protein, ur NEGATIVE  NEGATIVE (mg/dL)    Urobilinogen, UA 1.0  0.0 - 1.0 (mg/dL)    Nitrite POSITIVE (*) NEGATIVE     Leukocytes, UA LARGE (*) NEGATIVE    SURGICAL PCR SCREEN     Status: Normal   Collection Time   12/24/11  1:59 PM      Component Value Range Comment   MRSA, PCR NEGATIVE  NEGATIVE     Staphylococcus aureus NEGATIVE  NEGATIVE    URINE MICROSCOPIC-ADD ON     Status: Abnormal   Collection Time   12/24/11  1:59 PM      Component  Value Range Comment   Squamous Epithelial / LPF RARE  RARE     WBC, UA 21-50  <3 (WBC/hpf)    Bacteria, UA MANY (*) RARE    APTT     Status: Normal   Collection Time   12/24/11  2:00 PM      Component Value Range Comment   aPTT 31  24 - 37 (seconds)   CBC     Status: Abnormal   Collection Time   12/24/11  2:00 PM      Component Value Range Comment   WBC 10.3  4.0 - 10.5 (K/uL)    RBC 4.27  4.22 - 5.81 (MIL/uL)    Hemoglobin 12.4 (*) 13.0 - 17.0 (g/dL)    HCT 78.2 (*) 95.6 - 52.0 (%)    MCV 90.9  78.0 - 100.0 (fL)    MCH 29.0  26.0 - 34.0 (pg)    MCHC 32.0  30.0 - 36.0 (g/dL)    RDW 21.3  08.6 - 57.8 (%)    Platelets 208  150 - 400 (K/uL)   COMPREHENSIVE METABOLIC PANEL     Status: Abnormal   Collection Time   12/24/11  2:00 PM      Component Value Range Comment   Sodium 139  135 - 145 (mEq/L)    Potassium 4.5  3.5 - 5.1 (mEq/L)    Chloride 102  96 - 112 (mEq/L)    CO2 24  19 - 32 (mEq/L)    Glucose, Bld 106 (*) 70 - 99 (mg/dL)    BUN 35 (*) 6 - 23 (mg/dL)    Creatinine, Ser 4.69 (*) 0.50 - 1.35 (mg/dL)    Calcium 9.7  8.4 - 10.5 (mg/dL)    Total  Protein 7.2  6.0 - 8.3 (g/dL)    Albumin 4.0  3.5 - 5.2 (g/dL)    AST 13  0 - 37 (U/L)    ALT 12  0 - 53 (U/L)    Alkaline Phosphatase 75  39 - 117 (U/L)    Total Bilirubin 0.3  0.3 - 1.2 (mg/dL)    GFR calc non Af Amer 46 (*) >90 (mL/min)    GFR calc Af Amer 53 (*) >90 (mL/min)   PROTIME-INR     Status: Normal   Collection Time   12/24/11  2:00 PM      Component Value Range Comment   Prothrombin Time 13.8  11.6 - 15.2 (seconds)    INR 1.04  0.00 - 1.49     Dg Chest 2 View  12/24/2011  *RADIOLOGY REPORT*  Clinical Data: Preoperative respiratory exam.  Removal of hardware from right knee.  CHEST - 2 VIEW  Comparison: 01/06/2011  Findings: Heart size and vascularity are normal.  Lungs are clear. Huge hiatal hernia.  Accentuation of the thoracic kyphosis with multiple anterior wedge deformities in the thoracic spine, unchanged.  Arthritic changes of both shoulders with evidence of prior rotator cuff repair on the right.  IMPRESSION: No acute abnormalities.  Huge hiatal hernia.  Original Report Authenticated By: Gwynn Burly, M.D.    Review of Systems  Constitutional: Negative.   HENT: Negative.   Eyes:       Glasses  Respiratory: Negative.   Cardiovascular: Negative.   Gastrointestinal: Negative.   Musculoskeletal:       Right distal femur fracture supracondylar   Skin: Negative.   Neurological: Negative.     Blood pressure 108/68, pulse 61, temperature 97.5 F (36.4 C), temperature  source Oral, resp. rate 18, SpO2 98.00%. Physical Exam  Constitutional: He appears well-developed and well-nourished.  HENT:  Head: Normocephalic and atraumatic.  Eyes: EOM are normal. Pupils are equal, round, and reactive to light.  Neck: Normal range of motion.  Cardiovascular: Normal rate and regular rhythm.   Respiratory: Effort normal and breath sounds normal. He has no wheezes. He has no rales. He exhibits no tenderness.  GI: Soft.  Musculoskeletal:       Right leg short with right shoe  lift.   Neurological: He is alert.  Skin: Skin is warm.  Psychiatric: He has a normal mood and affect. His behavior is normal.     Assessment/Plan  for right distal femur screw removal for later right TKA once medial and lateral incisions heal from screw removal  .   Procedure discussed, risks discussed , he understands and agrees to proceed. All questions answered.   Freddie Dymek C 12/25/2011, 10:48 AM

## 2011-12-25 NOTE — Progress Notes (Signed)
Orthopedic Tech Progress Note Patient Details:  Andrew Salas 1926/01/18 [id number]  Other Ortho Devices Type of Ortho Device: Knee Immobilizer Ortho Device Location: right knee Ortho Device Interventions: Application   [name] 12/25/2011, 1:09 PM

## 2011-12-26 MED ORDER — OXYCODONE-ACETAMINOPHEN 5-325 MG PO TABS: 1.0000 | ORAL_TABLET | ORAL | Status: AC | PRN

## 2011-12-26 NOTE — Progress Notes (Signed)
Patient ID: Andrew Salas, male   DOB: 07-24-1926, 78 y.o.   MRN: [id number] Patient is status post removal of hardware from the distal femur. He is comfortable stable neurovascularly intact he is discharged to home in stable condition follow up in the office of Dr. Ophelia Charter in one week prescription for Percocet for pain

## 2011-12-26 NOTE — Evaluation (Signed)
Physical Therapy Evaluation Patient Details Name: Andrew Salas MRN: [id number] DOB: 11-27-1925 Today's Date: 12/26/2011  Problem List:  Patient Active Problem List  Diagnoses  . Post-traumatic osteoarthritis of right knee    Past Medical History:  Past Medical History  Diagnosis Date  . Hyperlipidemia   . Ulcer     stomach 60 years ago- no current problem  . Arthritis     R knee  . Hypertension     Dr Dulce Sellar in [place] is pt's cardiologist.   Past Surgical History:  Past Surgical History  Procedure Date  . Knee arthroscopy     Left  . Knee arthroscopy 1989    Right  . Eye surgery 12/2010    Cataract bil with lens implant  . Replacement total knee   . Knee reconstruction, medial patellar femoral ligament     "screws and pins inserted" from a fracture  . Prostate surgery     years ago "the Dr microwaved it"  . Hernia repair     per chest X- Ray    PT Assessment/Plan/Recommendation PT Assessment Clinical Impression Statement: Pt d/c'ing home today.  He has all home equipment needs.  His plan is to undergo R TKA in 3-4 weeks. PT Recommendation/Assessment: Patent does not need any further PT services No Skilled PT: All education completed;Patient will have necessary level of assist by caregiver at discharge PT Recommendation Follow Up Recommendations: No PT follow up Equipment Recommended: None recommended by PT PT Goals     PT Evaluation Precautions/Restrictions  Precautions Precautions: Knee Required Braces or Orthoses: Yes Knee Immobilizer: On when out of bed or walking Restrictions RLE Weight Bearing: Weight bearing as tolerated Prior Functioning  Home Living Lives With: Significant other Receives Help From: Friend(s) Type of Home: House Home Layout: One level Home Access: Stairs to enter Entrance Stairs-Rails: None Entrance Stairs-Number of Steps: 2 Home Adaptive Equipment: Walker - standard Prior Function Level of Independence: Independent with  basic ADLs;Independent with homemaking with ambulation;Independent with gait;Independent with transfers Driving: Yes Cognition Cognition Arousal/Alertness: Awake/alert Overall Cognitive Status: Appears within functional limits for tasks assessed Orientation Level: Oriented X4 Sensation/Coordination   Extremity Assessment   Mobility (including Balance) Bed Mobility Bed Mobility: Yes Supine to Sit: 6: Modified independent (Device/Increase time) Transfers Transfers: Yes Sit to Stand: 4: Min assist;From bed;With upper extremity assist Sit to Stand Details (indicate cue type and reason): verbal cues for sequencing Stand to Sit: 4: Min assist Stand to Sit Details: min guard assist, verbal cues for sequencing Stand Pivot Transfers: 4: Min assist Stand Pivot Transfer Details (indicate cue type and reason): min guard assist Ambulation/Gait Ambulation/Gait: Yes Ambulation/Gait Assistance: 4: Min assist Ambulation/Gait Assistance Details (indicate cue type and reason): min guard assist Ambulation Distance (Feet): 120 Feet Assistive device: Standard walker;Rolling walker (switched from SW to RW halfway) Gait Pattern: Step-to pattern;Antalgic Gait velocity: decreased Stairs: Yes Stairs Assistance: 4: Min assist Stairs Assistance Details (indicate cue type and reason): verbal cues for sequencing/technique Stair Management Technique: No rails;Backwards;With walker Number of Stairs: 2  Height of Stairs: 6     Exercise    End of Session PT - End of Session Equipment Utilized During Treatment: Gait belt;Right knee immobilizer Activity Tolerance: Patient tolerated treatment well Patient left: in chair;with call bell in reach;with family/visitor present Nurse Communication: Mobility status for transfers;Mobility status for ambulation General Behavior During Session: [facility] for tasks performed Cognition: [facility] for tasks performed  Ilda Foil 12/26/2011, 1:49 PM  Aida Raider, PT  Office # [phone] Pager [phone]

## 2011-12-26 NOTE — Op Note (Signed)
NAME:  Andrew Salas, Andrew Salas NO.:  [id number]  MEDICAL RECORD NO.:  [id number]  LOCATION:  5020                         FACILITY:  [facility]  PHYSICIAN:  Jackey Housey C. Ophelia Charter, M.D.    DATE OF BIRTH:  January 15, 1926  DATE OF PROCEDURE:  12/25/2011 DATE OF DISCHARGE:                              OPERATIVE REPORT   PREOPERATIVE DIAGNOSIS:  Right knee osteoarthritis with retained hardware.  Previous medial lateral plate for supracondylar nonunion.  POSTOPERATIVE DIAGNOSIS:  Right knee osteoarthritis with retained hardware.  Previous medial lateral plate for supracondylar nonunion.  PROCEDURE:  Removal of the screws, supracondylar region, medial and lateral, right knee.  SURGEON:  Glynis Hunsucker C. Ophelia Charter, M.D.  ANESTHESIA:  General.  TOURNIQUET TIME:  39 minutes x350.  INDICATIONS:  This 78 year old male had previous supracondylar fracture, treated elsewhere with plating.  He continued to have problems and eventually had hardware removed.  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  He underwent repeat lateral locking plate, Synthes, and also medial locking plate.  Also, Synthes back in 2005 with extensive bone grafting of the supracondylar fracture and healed successfully.  He had some malalignment from the original injury and over the years had progressive posttraumatic knee osteoarthritis.  The lateral locking plate has screws that are within a centimeter of the joint and he is brought back in for hardware removal, so he can later have total knee arthroplasty in a few weeks.  Plan was for removal of the screws, medial and lateral, that were the most distal and would be in the way for total knee arthroplasty, and now the rest of the screws with the plate going up to almost the subtroch region.  PROCEDURE IN DETAIL:  After induction of general anesthesia, orotracheal intubation, preoperative Ancef prophylaxis, prepping, from the ankle to the  proximal thigh tourniquet with DuraPrep, usual and impervious stockinette down half sheet, folded towel from the tourniquet with the towel clip, extremity sheets and drapes, Coban was applied over the stockinette.  Time-out procedure was completed.  The leg was wrapped with an Esmarch, tourniquet inflated to 350.  Lateral incision was made. First 2 incisions which were Ethibond were visualized and the tensor fascia split.  Cautery was used for small bleeders and medially I noted the iliotibial band was at the distal aspect of the plate. Subperiosteal dissection along the edge, so the plate was well visualized.  Soft tissue cleaned off the plate and the locking screws were extremely difficult to start out and then power was used to remove the rest away once the first 5 or 10 turns were made with hand screwdriver.  The medial screw was binding.  Some of the lateral screws had caught in contact and later on in the case fluoroscopy was brought in.  There was some metal debris from where the screws were removed, as they were either inserted or removed.  There was some little bits of metal fretting in the medial condyle in the midportion of the condyle. A total of 5 screws were removed from the distal lateral locking plate. Attention was then turned medially.  Incision was made  medially.  After some period of time, the plate was finally identified and the 2 distal most screws were removed.  C-arm was brought in.  One spot picture was taken to confirm that the screws, medially and laterally had been completely removed, that would be needed in order to proceed with total knee arthroplasty.  After irrigation with saline solution, tourniquet was deflated.  Standard layered closure with 0-Ethibond in the deep tissue, 2-0 Vicryl in the subcutaneous tissue, and skin with staple closure, Adaptic, 4 x 4s, ABD, and Ace wrap.  A knee immobilizer will be applied in the recovery room.  The patient will stay  overnight for IV pain medication and  will be discharged in the morning.     Rayce Brahmbhatt C. Ophelia Charter, M.D.     MCY/MEDQ  D:  12/25/2011  T:  12/26/2011  Job:  [id number]

## 2011-12-28 ENCOUNTER — Encounter (HOSPITAL_COMMUNITY): Payer: Self-pay | Admitting: Orthopaedic Surgery

## 2012-01-04 NOTE — Anesthesia Postprocedure Evaluation (Signed)
  Anesthesia Post-op Note  Patient: Andrew Salas  Procedure(s) Performed: Procedure(s) (LRB): HARDWARE REMOVAL (Right)  Patient seen in PACU, but discharged home before post op note written.  Patient was comfortable, conversant, and no anesthesia complications were noted.  Sandford Craze, MD

## 2012-01-07 ENCOUNTER — Other Ambulatory Visit (HOSPITAL_COMMUNITY): Payer: Self-pay | Admitting: Orthopaedic Surgery

## 2012-01-26 ENCOUNTER — Encounter (HOSPITAL_COMMUNITY): Payer: Self-pay

## 2012-01-26 ENCOUNTER — Encounter (HOSPITAL_COMMUNITY)
Admission: RE | Admit: 2012-01-26 | Discharge: 2012-01-26 | Disposition: A | Discharge status: Home / Self Care | Payer: Medicare Other | Source: Ambulatory Visit | Attending: Orthopaedic Surgery | Admitting: Orthopaedic Surgery

## 2012-01-26 LAB — COMPREHENSIVE METABOLIC PANEL
Albumin: 3.7 g/dL (ref 3.5–5.2)
BUN: 37 mg/dL — ABNORMAL HIGH (ref 6–23)
Creatinine, Ser: 1.3 mg/dL (ref 0.50–1.35)
GFR calc Af Amer: 57 mL/min — ABNORMAL LOW (ref >90)
Glucose, Bld: 91 mg/dL (ref 70–99)
Total Bilirubin: 0.2 mg/dL — ABNORMAL LOW (ref 0.3–1.2)
Total Protein: 7 g/dL (ref 6.0–8.3)

## 2012-01-26 LAB — URINALYSIS, ROUTINE W REFLEX MICROSCOPIC
Bilirubin Urine: NEGATIVE
Hgb urine dipstick: NEGATIVE
Nitrite: NEGATIVE
Protein, ur: NEGATIVE mg/dL
Specific Gravity, Urine: 1.022 (ref 1.005–1.030)
Urobilinogen, UA: 1 mg/dL (ref 0.0–1.0)

## 2012-01-26 LAB — CBC
HCT: 36.6 % — ABNORMAL LOW (ref 39.0–52.0)
Hemoglobin: 11.9 g/dL — ABNORMAL LOW (ref 13.0–17.0)
MCHC: 32.5 g/dL (ref 30.0–36.0)
MCV: 89.9 fL (ref 78.0–100.0)
RDW: 14.3 % (ref 11.5–15.5)

## 2012-01-26 LAB — URINE MICROSCOPIC-ADD ON

## 2012-01-26 LAB — ABO/RH: ABO/RH(D): A POS

## 2012-01-26 LAB — SURGICAL PCR SCREEN: Staphylococcus aureus: NEGATIVE

## 2012-01-26 LAB — PROTIME-INR
INR: 1.02 (ref 0.00–1.49)
Prothrombin Time: 13.6 seconds (ref 11.6–15.2)

## 2012-01-26 LAB — APTT: aPTT: 31 seconds (ref 24–37)

## 2012-01-26 NOTE — Progress Notes (Signed)
Chart left for anesthesia review, EKG.

## 2012-01-26 NOTE — Pre-Procedure Instructions (Signed)
20 Andrew Salas  01/26/2012   Your procedure is scheduled on:  Wednesday March 20  Report to [facility] Short Stay Center at 10:40 AM.  Call this number if you have problems the morning of surgery: [phone]   Remember:   Do not eat food:After Midnight.  May have clear liquids: up to 4 Hours before arrival.  Clear liquids include soda, tea, black coffee, apple or grape juice, broth.  Take these medicines the morning of surgery with A SIP OF WATER: Amlodipine   Do not wear jewelry, make-up or nail polish.  Do not wear lotions, powders, or perfumes. You may wear deodorant.  Do not shave 48 hours prior to surgery.  Do not bring valuables to the hospital.  Contacts, dentures or bridgework may not be worn into surgery.  Leave suitcase in the car. After surgery it may be brought to your room.  For patients admitted to the hospital, checkout time is 11:00 AM the day of discharge.   Patients discharged the day of surgery will not be allowed to drive home.  Name and phone number of your driver: NA  Special Instructions: Incentive Spirometry - Practice and bring it with you on the day of surgery. and CHG Shower Use Special Wash: 1/2 bottle night before surgery and 1/2 bottle morning of surgery.   Please read over the following fact sheets that you were given: Pain Booklet, Coughing and Deep Breathing, Blood Transfusion Information, Total Joint Packet and Surgical Site Infection Prevention

## 2012-01-27 NOTE — Consult Note (Signed)
Anesthesia:  Patient is an 78 year old male scheduled for a right TKA on 02/03/12.  I initially saw him in consultation on 12/25/11 when he underwent hardware removal from his right distal femur.  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  He has seen Dr. Woodfin Ganja with [facility] in the past.  He has a known right BBB.  He was asymptomatic from a cardiac standpoint at the time of his last procedure.  Labs reviewed.  UA shows large leukocytes, WBC too numerous to count, negative nitrites.  This result was called to [facility] at Dr. Ophelia Charter' office.  Defer decision for treatment to him.  CXR on 12/24/11 showed no acute abnormalities, huge hiatal hernia.  Anticipate he can proceed from an Anesthesia standpoint.

## 2012-02-02 MED ORDER — CEFAZOLIN SODIUM-DEXTROSE 2-3 GM-% IV SOLR
2.0000 g | INTRAVENOUS | Status: AC
  Administered 2012-02-03: 2 g via INTRAVENOUS
  Filled 2012-02-02: qty 50

## 2012-02-02 NOTE — Progress Notes (Signed)
Pt called and instructed to arrive at short stay tomorrow at 1040 am and to remain NPO after midnight and also not to have any liquids after 0640 tomorrow am. Pt verbalized understanding.

## 2012-02-03 ENCOUNTER — Encounter (HOSPITAL_COMMUNITY)
Admission: RE | Disposition: A | Discharge status: Home Health | Payer: Self-pay | Source: Ambulatory Visit | Attending: Orthopaedic Surgery

## 2012-02-03 ENCOUNTER — Inpatient Hospital Stay (HOSPITAL_COMMUNITY)
Admission: RE | Admit: 2012-02-03 | Discharge: 2012-02-07 | DRG: 470 | Disposition: A | Discharge status: Home Health | Payer: Medicare Other | Source: Ambulatory Visit | Attending: Orthopaedic Surgery | Admitting: Orthopaedic Surgery

## 2012-02-03 ENCOUNTER — Ambulatory Visit (HOSPITAL_COMMUNITY): Payer: Medicare Other

## 2012-02-03 ENCOUNTER — Encounter (HOSPITAL_COMMUNITY): Payer: Self-pay | Admitting: *Deleted

## 2012-02-03 ENCOUNTER — Ambulatory Visit (HOSPITAL_COMMUNITY): Payer: Medicare Other | Admitting: Vascular Surgery

## 2012-02-03 ENCOUNTER — Encounter (HOSPITAL_COMMUNITY): Payer: Self-pay | Admitting: Vascular Surgery

## 2012-02-03 DIAGNOSIS — M1711 Unilateral primary osteoarthritis, right knee: Secondary | ICD-10-CM | POA: Diagnosis present

## 2012-02-03 DIAGNOSIS — Z87891 Personal history of nicotine dependence: Secondary | ICD-10-CM

## 2012-02-03 DIAGNOSIS — I10 Essential (primary) hypertension: Secondary | ICD-10-CM | POA: Diagnosis present

## 2012-02-03 DIAGNOSIS — D62 Acute posthemorrhagic anemia: Secondary | ICD-10-CM | POA: Diagnosis not present

## 2012-02-03 DIAGNOSIS — Z01812 Encounter for preprocedural laboratory examination: Secondary | ICD-10-CM

## 2012-02-03 DIAGNOSIS — K5900 Constipation, unspecified: Secondary | ICD-10-CM | POA: Diagnosis not present

## 2012-02-03 DIAGNOSIS — M12569 Traumatic arthropathy, unspecified knee: Principal | ICD-10-CM | POA: Diagnosis present

## 2012-02-03 DIAGNOSIS — M1731 Unilateral post-traumatic osteoarthritis, right knee: Secondary | ICD-10-CM | POA: Diagnosis present

## 2012-02-03 HISTORY — PX: KNEE ARTHROPLASTY: SHX992

## 2012-02-03 SURGERY — ARTHROPLASTY, KNEE, TOTAL, USING IMAGELESS COMPUTER-ASSISTED NAVIGATION
Anesthesia: Regional | Site: Knee | Laterality: Right | Wound class: Clean

## 2012-02-03 MED ORDER — DIPHENHYDRAMINE HCL 12.5 MG/5ML PO ELIX: 12.5000 mg | ORAL_SOLUTION | Freq: Four times a day (QID) | ORAL | Status: DC | PRN

## 2012-02-03 MED ORDER — METOCLOPRAMIDE HCL 5 MG/ML IJ SOLN
INTRAMUSCULAR | Status: DC | PRN
  Administered 2012-02-03: 10 mg via INTRAVENOUS

## 2012-02-03 MED ORDER — HYDROCODONE-ACETAMINOPHEN 5-325 MG PO TABS
1.0000 | ORAL_TABLET | ORAL | Status: DC | PRN
  Administered 2012-02-05: 1 via ORAL
  Administered 2012-02-05: 2 via ORAL
  Administered 2012-02-05 – 2012-02-06 (×3): 1 via ORAL
  Administered 2012-02-06: 2 via ORAL
  Administered 2012-02-06 (×2): 1 via ORAL
  Administered 2012-02-07 (×2): 2 via ORAL
  Filled 2012-02-03: qty 2
  Filled 2012-02-03 (×3): qty 1
  Filled 2012-02-03: qty 2
  Filled 2012-02-03 (×6): qty 1
  Filled 2012-02-03: qty 2

## 2012-02-03 MED ORDER — FENTANYL CITRATE 0.05 MG/ML IJ SOLN
INTRAMUSCULAR | Status: AC
Start: 2012-02-03 — End: 2012-02-03
  Filled 2012-02-03: qty 2

## 2012-02-03 MED ORDER — BENAZEPRIL HCL 40 MG PO TABS
40.0000 mg | ORAL_TABLET | Freq: Every day | ORAL | Status: DC
  Administered 2012-02-03 – 2012-02-07 (×5): 40 mg via ORAL
  Filled 2012-02-03 (×5): qty 1

## 2012-02-03 MED ORDER — MIDAZOLAM HCL 2 MG/2ML IJ SOLN
INTRAMUSCULAR | Status: AC
  Filled 2012-02-03: qty 2

## 2012-02-03 MED ORDER — FENTANYL CITRATE 0.05 MG/ML IJ SOLN
50.0000 ug | INTRAMUSCULAR | Status: DC | PRN
  Administered 2012-02-03: 100 ug via INTRAVENOUS

## 2012-02-03 MED ORDER — DROPERIDOL 2.5 MG/ML IJ SOLN
INTRAMUSCULAR | Status: DC | PRN
  Administered 2012-02-03: 0.625 mg via INTRAVENOUS

## 2012-02-03 MED ORDER — METHOCARBAMOL 500 MG PO TABS
500.0000 mg | ORAL_TABLET | Freq: Four times a day (QID) | ORAL | Status: DC | PRN
  Administered 2012-02-05 – 2012-02-07 (×2): 500 mg via ORAL
  Filled 2012-02-03 (×3): qty 1

## 2012-02-03 MED ORDER — MIDAZOLAM HCL 2 MG/2ML IJ SOLN
1.0000 mg | INTRAMUSCULAR | Status: DC | PRN
  Administered 2012-02-03: 2 mg via INTRAVENOUS

## 2012-02-03 MED ORDER — LACTATED RINGERS IV SOLN
INTRAVENOUS | Status: DC
  Administered 2012-02-03 (×2): via INTRAVENOUS

## 2012-02-03 MED ORDER — MENTHOL 3 MG MT LOZG: 1.0000 | LOZENGE | OROMUCOSAL | Status: DC | PRN

## 2012-02-03 MED ORDER — HYDROMORPHONE HCL PF 1 MG/ML IJ SOLN
INTRAMUSCULAR | Status: AC
  Filled 2012-02-03: qty 1

## 2012-02-03 MED ORDER — ACETAMINOPHEN 325 MG PO TABS
650.0000 mg | ORAL_TABLET | Freq: Four times a day (QID) | ORAL | Status: DC | PRN
  Administered 2012-02-07: 650 mg via ORAL
  Filled 2012-02-03: qty 2

## 2012-02-03 MED ORDER — AMLODIPINE BESYLATE 10 MG PO TABS
10.0000 mg | ORAL_TABLET | Freq: Every day | ORAL | Status: DC
  Administered 2012-02-04 – 2012-02-07 (×4): 10 mg via ORAL
  Filled 2012-02-03 (×4): qty 1

## 2012-02-03 MED ORDER — DEXTROSE 5 % IV SOLN
INTRAVENOUS | Status: DC | PRN
  Administered 2012-02-03: 15:00:00 via INTRAVENOUS

## 2012-02-03 MED ORDER — PATIENT'S GUIDE TO USING COUMADIN BOOK
Freq: Once | Status: AC
  Administered 2012-02-03: 21:00:00
  Filled 2012-02-03: qty 1

## 2012-02-03 MED ORDER — EPHEDRINE SULFATE 50 MG/ML IJ SOLN
INTRAMUSCULAR | Status: DC | PRN
  Administered 2012-02-03: 10 mg via INTRAVENOUS
  Administered 2012-02-03: 5 mg via INTRAVENOUS
  Administered 2012-02-03: 10 mg via INTRAVENOUS

## 2012-02-03 MED ORDER — PHENOL 1.4 % MT LIQD: 1.0000 | OROMUCOSAL | Status: DC | PRN

## 2012-02-03 MED ORDER — OXYCODONE-ACETAMINOPHEN 5-325 MG PO TABS
1.0000 | ORAL_TABLET | ORAL | Status: DC | PRN
  Administered 2012-02-04 – 2012-02-05 (×4): 2 via ORAL
  Filled 2012-02-03 (×4): qty 2

## 2012-02-03 MED ORDER — METOCLOPRAMIDE HCL 5 MG/ML IJ SOLN
5.0000 mg | Freq: Three times a day (TID) | INTRAMUSCULAR | Status: DC | PRN
  Administered 2012-02-05: 10 mg via INTRAVENOUS
  Filled 2012-02-03 (×2): qty 2

## 2012-02-03 MED ORDER — ROCURONIUM BROMIDE 100 MG/10ML IV SOLN
INTRAVENOUS | Status: DC | PRN
  Administered 2012-02-03: 50 mg via INTRAVENOUS

## 2012-02-03 MED ORDER — LACTATED RINGERS IV SOLN
INTRAVENOUS | Status: DC | PRN
  Administered 2012-02-03 (×3): via INTRAVENOUS

## 2012-02-03 MED ORDER — FENTANYL CITRATE 0.05 MG/ML IJ SOLN
INTRAMUSCULAR | Status: DC | PRN
  Administered 2012-02-03 (×2): 100 ug via INTRAVENOUS
  Administered 2012-02-03 (×2): 50 ug via INTRAVENOUS
  Administered 2012-02-03: 100 ug via INTRAVENOUS
  Administered 2012-02-03 (×3): 50 ug via INTRAVENOUS

## 2012-02-03 MED ORDER — SIMVASTATIN 20 MG PO TABS
20.0000 mg | ORAL_TABLET | Freq: Every day | ORAL | Status: DC
  Administered 2012-02-04 – 2012-02-06 (×3): 20 mg via ORAL
  Filled 2012-02-03 (×4): qty 1

## 2012-02-03 MED ORDER — LIDOCAINE HCL (CARDIAC) 20 MG/ML IV SOLN
INTRAVENOUS | Status: DC | PRN
  Administered 2012-02-03: 70 mg via INTRAVENOUS

## 2012-02-03 MED ORDER — SODIUM CHLORIDE 0.9 % IR SOLN
Status: DC | PRN
  Administered 2012-02-03: 1000 mL

## 2012-02-03 MED ORDER — CEFAZOLIN SODIUM 1-5 GM-% IV SOLN
1.0000 g | Freq: Four times a day (QID) | INTRAVENOUS | Status: AC
  Administered 2012-02-03 – 2012-02-04 (×3): 1 g via INTRAVENOUS
  Filled 2012-02-03 (×3): qty 50

## 2012-02-03 MED ORDER — DIPHENHYDRAMINE HCL 12.5 MG/5ML PO ELIX: 12.5000 mg | ORAL_SOLUTION | ORAL | Status: DC | PRN

## 2012-02-03 MED ORDER — WARFARIN VIDEO
Freq: Once | Status: AC
  Administered 2012-02-03: 21:00:00

## 2012-02-03 MED ORDER — ACETAMINOPHEN 650 MG RE SUPP: 650.0000 mg | Freq: Four times a day (QID) | RECTAL | Status: DC | PRN

## 2012-02-03 MED ORDER — ONDANSETRON HCL 4 MG/2ML IJ SOLN: 4.0000 mg | Freq: Four times a day (QID) | INTRAMUSCULAR | Status: DC | PRN

## 2012-02-03 MED ORDER — DOCUSATE SODIUM 100 MG PO CAPS
100.0000 mg | ORAL_CAPSULE | Freq: Two times a day (BID) | ORAL | Status: DC
  Administered 2012-02-03 – 2012-02-07 (×7): 100 mg via ORAL
  Filled 2012-02-03 (×8): qty 1

## 2012-02-03 MED ORDER — ZOLPIDEM TARTRATE 5 MG PO TABS: 5.0000 mg | ORAL_TABLET | Freq: Every evening | ORAL | Status: DC | PRN

## 2012-02-03 MED ORDER — NEOSTIGMINE METHYLSULFATE 1 MG/ML IJ SOLN
INTRAMUSCULAR | Status: DC | PRN
  Administered 2012-02-03: 5 mg via INTRAVENOUS

## 2012-02-03 MED ORDER — WARFARIN SODIUM 7.5 MG PO TABS
7.5000 mg | ORAL_TABLET | Freq: Once | ORAL | Status: AC
  Administered 2012-02-03: 7.5 mg via ORAL
  Filled 2012-02-03: qty 1

## 2012-02-03 MED ORDER — MORPHINE SULFATE (PF) 1 MG/ML IV SOLN
INTRAVENOUS | Status: DC
  Administered 2012-02-03: 18:00:00 via INTRAVENOUS
  Administered 2012-02-04: 6 mg via INTRAVENOUS
  Administered 2012-02-04: 1.5 mg via INTRAVENOUS

## 2012-02-03 MED ORDER — HYDROCHLOROTHIAZIDE 25 MG PO TABS
25.0000 mg | ORAL_TABLET | Freq: Every day | ORAL | Status: DC
  Administered 2012-02-03 – 2012-02-07 (×5): 25 mg via ORAL
  Filled 2012-02-03 (×5): qty 1

## 2012-02-03 MED ORDER — GLYCOPYRROLATE 0.2 MG/ML IJ SOLN
INTRAMUSCULAR | Status: DC | PRN
  Administered 2012-02-03: .6 mg via INTRAVENOUS

## 2012-02-03 MED ORDER — KCL IN DEXTROSE-NACL 20-5-0.45 MEQ/L-%-% IV SOLN
INTRAVENOUS | Status: DC
  Administered 2012-02-03: 23:00:00 via INTRAVENOUS
  Filled 2012-02-03 (×2): qty 1000

## 2012-02-03 MED ORDER — METHOCARBAMOL 100 MG/ML IJ SOLN
500.0000 mg | Freq: Four times a day (QID) | INTRAVENOUS | Status: DC | PRN
  Administered 2012-02-03: 500 mg via INTRAVENOUS
  Filled 2012-02-03: qty 5

## 2012-02-03 MED ORDER — FERROUS SULFATE 325 (65 FE) MG PO TABS
325.0000 mg | ORAL_TABLET | Freq: Three times a day (TID) | ORAL | Status: DC
  Administered 2012-02-04 – 2012-02-07 (×10): 325 mg via ORAL
  Filled 2012-02-03 (×13): qty 1

## 2012-02-03 MED ORDER — PROPOFOL 10 MG/ML IV EMUL
INTRAVENOUS | Status: DC | PRN
  Administered 2012-02-03: 150 mg via INTRAVENOUS

## 2012-02-03 MED ORDER — HYDROMORPHONE HCL PF 1 MG/ML IJ SOLN
0.2500 mg | INTRAMUSCULAR | Status: DC | PRN
  Administered 2012-02-03 (×2): 0.5 mg via INTRAVENOUS

## 2012-02-03 MED ORDER — FLEET ENEMA 7-19 GM/118ML RE ENEM: 1.0000 | ENEMA | Freq: Once | RECTAL | Status: AC | PRN

## 2012-02-03 MED ORDER — ONDANSETRON HCL 4 MG PO TABS
4.0000 mg | ORAL_TABLET | Freq: Four times a day (QID) | ORAL | Status: DC | PRN
  Administered 2012-02-04: 4 mg via ORAL
  Filled 2012-02-03: qty 1

## 2012-02-03 MED ORDER — WARFARIN - PHARMACIST DOSING INPATIENT
Freq: Every day | Status: DC
  Administered 2012-02-05: 18:00:00

## 2012-02-03 MED ORDER — ONDANSETRON HCL 4 MG/2ML IJ SOLN
INTRAMUSCULAR | Status: DC | PRN
  Administered 2012-02-03: 4 mg via INTRAVENOUS

## 2012-02-03 MED ORDER — CARBOXYMETHYLCELLULOSE SODIUM 0.5 % OP SOLN: 1.0000 [drp] | Freq: Three times a day (TID) | OPHTHALMIC | Status: DC | PRN

## 2012-02-03 MED ORDER — DIPHENHYDRAMINE HCL 50 MG/ML IJ SOLN: 12.5000 mg | Freq: Four times a day (QID) | INTRAMUSCULAR | Status: DC | PRN

## 2012-02-03 MED ORDER — SODIUM CHLORIDE 0.9 % IJ SOLN: 9.0000 mL | INTRAMUSCULAR | Status: DC | PRN

## 2012-02-03 MED ORDER — METOCLOPRAMIDE HCL 10 MG PO TABS: 5.0000 mg | ORAL_TABLET | Freq: Three times a day (TID) | ORAL | Status: DC | PRN

## 2012-02-03 MED ORDER — NALOXONE HCL 0.4 MG/ML IJ SOLN: 0.4000 mg | INTRAMUSCULAR | Status: DC | PRN

## 2012-02-03 MED ORDER — BISACODYL 10 MG RE SUPP
10.0000 mg | Freq: Every day | RECTAL | Status: DC | PRN
  Filled 2012-02-03: qty 1

## 2012-02-03 MED ORDER — POLYVINYL ALCOHOL 1.4 % OP SOLN
1.0000 [drp] | Freq: Three times a day (TID) | OPHTHALMIC | Status: DC | PRN
  Filled 2012-02-03: qty 15

## 2012-02-03 MED ORDER — BUPIVACAINE-EPINEPHRINE PF 0.5-1:200000 % IJ SOLN
INTRAMUSCULAR | Status: DC | PRN
  Administered 2012-02-03: 30 mL

## 2012-02-03 MED ORDER — SENNOSIDES-DOCUSATE SODIUM 8.6-50 MG PO TABS: 1.0000 | ORAL_TABLET | Freq: Every evening | ORAL | Status: DC | PRN

## 2012-02-03 SURGICAL SUPPLY — 69 items
APL SKNCLS STERI-STRIP NONHPOA (GAUZE/BANDAGES/DRESSINGS) ×1
BANDAGE ELASTIC 4 VELCRO ST LF (GAUZE/BANDAGES/DRESSINGS) ×2 IMPLANT
BANDAGE ESMARK 6X9 LF (GAUZE/BANDAGES/DRESSINGS) ×1 IMPLANT
BENZOIN TINCTURE PRP APPL 2/3 (GAUZE/BANDAGES/DRESSINGS) ×2 IMPLANT
BLADE SAGITTAL 25.0X1.19X90 (BLADE) ×2 IMPLANT
BLADE SAW SGTL 13X75X1.27 (BLADE) ×2 IMPLANT
BNDG CMPR 9X6 STRL LF SNTH (GAUZE/BANDAGES/DRESSINGS) ×1
BNDG CMPR MED 10X6 ELC LF (GAUZE/BANDAGES/DRESSINGS) ×1
BNDG COHESIVE 6X5 TAN NS LF (GAUZE/BANDAGES/DRESSINGS) ×1 IMPLANT
BNDG ELASTIC 6X10 VLCR STRL LF (GAUZE/BANDAGES/DRESSINGS) ×2 IMPLANT
BNDG ESMARK 6X9 LF (GAUZE/BANDAGES/DRESSINGS) ×2
BOWL SMART MIX CTS (DISPOSABLE) ×2 IMPLANT
CEMENT HV SMART SET (Cement) ×4 IMPLANT
CLOTH BEACON ORANGE TIMEOUT ST (SAFETY) ×2 IMPLANT
CLSR STERI-STRIP ANTIMIC 1/2X4 (GAUZE/BANDAGES/DRESSINGS) ×2 IMPLANT
COVER BACK TABLE 24X17X13 BIG (DRAPES) IMPLANT
COVER SURGICAL LIGHT HANDLE (MISCELLANEOUS) ×2 IMPLANT
CUFF TOURNIQUET SINGLE 34IN LL (TOURNIQUET CUFF) ×2 IMPLANT
CUFF TOURNIQUET SINGLE 44IN (TOURNIQUET CUFF) IMPLANT
DRAPE ORTHO SPLIT 77X108 STRL (DRAPES) ×4
DRAPE SURG ORHT 6 SPLT 77X108 (DRAPES) ×2 IMPLANT
DRAPE U-SHAPE 47X51 STRL (DRAPES) ×2 IMPLANT
DRSG PAD ABDOMINAL 8X10 ST (GAUZE/BANDAGES/DRESSINGS) ×4 IMPLANT
DURAPREP 26ML APPLICATOR (WOUND CARE) ×2 IMPLANT
ELECT REM PT RETURN 9FT ADLT (ELECTROSURGICAL) ×2
ELECTRODE REM PT RTRN 9FT ADLT (ELECTROSURGICAL) ×1 IMPLANT
FACESHIELD LNG OPTICON STERILE (SAFETY) ×3 IMPLANT
GAUZE XEROFORM 5X9 LF (GAUZE/BANDAGES/DRESSINGS) ×2 IMPLANT
GLOVE BIOGEL PI IND STRL 7.5 (GLOVE) ×1 IMPLANT
GLOVE BIOGEL PI IND STRL 8 (GLOVE) ×1 IMPLANT
GLOVE BIOGEL PI INDICATOR 7.5 (GLOVE) ×1
GLOVE BIOGEL PI INDICATOR 8 (GLOVE) ×1
GLOVE ECLIPSE 7.0 STRL STRAW (GLOVE) ×2 IMPLANT
GLOVE ORTHO TXT STRL SZ7.5 (GLOVE) ×2 IMPLANT
GOWN PREVENTION PLUS LG XLONG (DISPOSABLE) IMPLANT
GOWN PREVENTION PLUS XLARGE (GOWN DISPOSABLE) ×2 IMPLANT
GOWN STRL NON-REIN LRG LVL3 (GOWN DISPOSABLE) ×6 IMPLANT
HANDPIECE INTERPULSE COAX TIP (DISPOSABLE) ×2
IMMOBILIZER KNEE 22 UNIV (SOFTGOODS) ×2 IMPLANT
KIT BASIN OR (CUSTOM PROCEDURE TRAY) ×2 IMPLANT
KIT ROOM TURNOVER OR (KITS) ×2 IMPLANT
MANIFOLD NEPTUNE II (INSTRUMENTS) ×2 IMPLANT
MARKER SPHERE PSV REFLC THRD 5 (MARKER) ×6 IMPLANT
NDL 1/2 CIR MAYO (NEEDLE) ×1 IMPLANT
NDL HYPO 25GX1X1/2 BEV (NEEDLE) ×1 IMPLANT
NEEDLE 1/2 CIR MAYO (NEEDLE) ×2 IMPLANT
NEEDLE HYPO 25GX1X1/2 BEV (NEEDLE) ×2 IMPLANT
NS IRRIG 1000ML POUR BTL (IV SOLUTION) ×2 IMPLANT
PACK TOTAL JOINT (CUSTOM PROCEDURE TRAY) ×2 IMPLANT
PAD ARMBOARD 7.5X6 YLW CONV (MISCELLANEOUS) ×4 IMPLANT
PAD CAST 4YDX4 CTTN HI CHSV (CAST SUPPLIES) ×1 IMPLANT
PADDING CAST COTTON 4X4 STRL (CAST SUPPLIES) ×2
PADDING CAST COTTON 6X4 STRL (CAST SUPPLIES) ×2 IMPLANT
PIN SCHANZ 4MM 130MM (PIN) ×8 IMPLANT
SET HNDPC FAN SPRY TIP SCT (DISPOSABLE) ×1 IMPLANT
SPONGE GAUZE 4X4 12PLY (GAUZE/BANDAGES/DRESSINGS) ×2 IMPLANT
STAPLER VISISTAT 35W (STAPLE) ×2 IMPLANT
STRIP CLOSURE SKIN 1/2X4 (GAUZE/BANDAGES/DRESSINGS) ×4 IMPLANT
SUCTION FRAZIER TIP 10 FR DISP (SUCTIONS) ×2 IMPLANT
SUT ETHIBOND NAB CT1 #1 30IN (SUTURE) ×2 IMPLANT
SUT VIC AB 2-0 CT1 27 (SUTURE) ×6
SUT VIC AB 2-0 CT1 TAPERPNT 27 (SUTURE) ×2 IMPLANT
SUT VICRYL 4-0 PS2 18IN ABS (SUTURE) ×2 IMPLANT
SUT VICRYL AB 2 0 TIES (SUTURE) ×2 IMPLANT
SYR CONTROL 10ML LL (SYRINGE) ×2 IMPLANT
TOWEL OR 17X24 6PK STRL BLUE (TOWEL DISPOSABLE) ×2 IMPLANT
TOWEL OR 17X26 10 PK STRL BLUE (TOWEL DISPOSABLE) ×2 IMPLANT
TRAY FOLEY CATH 14FR (SET/KITS/TRAYS/PACK) ×2 IMPLANT
WATER STERILE IRR 1000ML POUR (IV SOLUTION) ×6 IMPLANT

## 2012-02-03 NOTE — Brief Op Note (Cosign Needed)
02/03/2012  5:20 PM  PATIENT:  Andrew Salas  78 y.o. male  PRE-OPERATIVE DIAGNOSIS:  Right knee osteoarthritis  POST-OPERATIVE DIAGNOSIS:  Right Knee Osteoarthritis  PROCEDURE:  Procedure(s) (LRB): COMPUTER ASSISTED TOTAL KNEE ARTHROPLASTY (Right)  SURGEON:  Surgeon(s) and Role:    * Eldred Manges, MD - Primary  PHYSICIAN ASSISTANT: [facility]  ASSISTANTS: none   ANESTHESIA:   regional and general  EBL:  Total I/O In: 2050 [I.V.:2050] Out: 250 [Urine:250]  BLOOD ADMINISTERED:none  DRAINS: none   LOCAL MEDICATIONS USED:  NONE  SPECIMEN:  No Specimen  DISPOSITION OF SPECIMEN:  N/A  COUNTS:  YES  TOURNIQUET:   Total Tourniquet Time Documented: Thigh (Right) - 89 minutes  DICTATION: .Note written in EPIC  PLAN OF CARE: Admit to inpatient   PATIENT DISPOSITION:  PACU - hemodynamically stable.   Delay start of Pharmacological VTE agent (>24hrs) due to surgical blood loss or risk of bleeding: not applicable

## 2012-02-03 NOTE — Anesthesia Procedure Notes (Signed)
Anesthesia Regional Block:  Femoral nerve block  Pre-Anesthetic Checklist: ,, timeout performed, Correct Patient, Correct Site, Correct Laterality, Correct Procedure,, site marked, risks and benefits discussed, Surgical consent,  Pre-op evaluation,  At surgeon's request and post-op pain management  Laterality: Right  Prep: chloraprep       Needles:  Injection technique: Single-shot  Needle Type: Echogenic Stimulator Needle     Needle Length: 9cm  Needle Gauge: 21    Additional Needles:  Procedures: nerve stimulator Femoral nerve block  Nerve Stimulator or Paresthesia:  Response: Quadriceps muscle contraction, 0.45 mA,   Additional Responses:   Narrative:  Start time: 02/03/2012 1:10 PM End time: 02/03/2012 1:22 PM Injection made incrementally with aspirations every 5 mL.  Performed by: Personally  Anesthesiologist: Dr Chaney Malling  Additional Notes: Functioning IV was confirmed and monitors were applied.  A 90mm 21ga Arrow echogenic stimulator needle was used. Sterile prep and drape,hand hygiene and sterile gloves were used.  Negative aspiration and negative test dose prior to incremental administration of local anesthetic. The patient tolerated the procedure well.    Femoral nerve block

## 2012-02-03 NOTE — Interval H&P Note (Signed)
History and Physical Interval Note:  02/03/2012 2:42 PM  Andrew Salas  has presented today for surgery, with the diagnosis of Right knee osteoarthritis  The various methods of treatment have been discussed with the patient and family. After consideration of risks, benefits and other options for treatment, the patient has consented to  Procedure(s) (LRB): COMPUTER ASSISTED TOTAL KNEE ARTHROPLASTY (Right) as a surgical intervention .  The patients' history has been reviewed, patient examined, no change in status, stable for surgery.  I have reviewed the patients' chart and labs.  Questions were answered to the patient's satisfaction.     [name]

## 2012-02-03 NOTE — Preoperative (Signed)
Beta Blockers   Reason not to administer Beta Blockers:Not Applicable 

## 2012-02-03 NOTE — Anesthesia Preprocedure Evaluation (Addendum)
Anesthesia Evaluation  Patient identified by MRN, date of birth, ID band Patient awake    Reviewed: Allergy & Precautions, H&P , NPO status , Patient's Chart, lab work & pertinent test results  Airway Mallampati: II TM Distance: >3 FB Neck ROM: full    Dental  (+) Dental Advisory Given and Edentulous Lower   Pulmonary former smoker         Cardiovascular hypertension, Pt. on medications     Neuro/Psych    GI/Hepatic   Endo/Other    Renal/GU      Musculoskeletal  (+) Arthritis -, Osteoarthritis,    Abdominal   Peds  Hematology   Anesthesia Other Findings   Reproductive/Obstetrics                          Anesthesia Physical Anesthesia Plan  ASA: II  Anesthesia Plan: General and Regional   Post-op Pain Management: MAC Combined w/ Regional for Post-op pain   Induction: Intravenous  Airway Management Planned: Oral ETT  Additional Equipment:   Intra-op Plan:   Post-operative Plan: Extubation in OR  Informed Consent: I have reviewed the patients History and Physical, chart, labs and discussed the procedure including the risks, benefits and alternatives for the proposed anesthesia with the patient or authorized representative who has indicated his/her understanding and acceptance.   Dental advisory given  Plan Discussed with: CRNA, Surgeon and Anesthesiologist  Anesthesia Plan Comments:        Anesthesia Quick Evaluation

## 2012-02-03 NOTE — Progress Notes (Signed)
Orthopedic Tech Progress Note Patient Details:  Andrew Salas 01/07/1926 [id number]  Patient ID: Andrew Salas, male   DOB: 1926/11/01, 78 y.o.   MRN: [id number]   Andrew Salas 02/03/2012, 6:42 PM Viewed order from rn order list

## 2012-02-03 NOTE — Transfer of Care (Signed)
Immediate Anesthesia Transfer of Care Note  Patient: Andrew Salas  Procedure(s) Performed: Procedure(s) (LRB): COMPUTER ASSISTED TOTAL KNEE ARTHROPLASTY (Right)  Patient Location: PACU  Anesthesia Type: General and GA combined with regional for post-op pain  Level of Consciousness: oriented, sedated, patient cooperative and responds to stimulation  Airway & Oxygen Therapy: Patient Spontanous Breathing and Patient connected to nasal cannula oxygen  Post-op Assessment: Report given to PACU RN, Post -op Vital signs reviewed and stable, Patient moving all extremities and Patient moving all extremities X 4  Post vital signs: Reviewed and stable  Complications: No apparent anesthesia complications

## 2012-02-03 NOTE — H&P (Signed)
Andrew Salas is an 78 y.o. male.   Chief Complaint: right knee pain HPI: This is an 78 year old male who returns with right knee pain.  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  His leg was 3 inches short.  Surgery was in 2005.  Dr. August Saucer assisted.  He had plating medial and laterally.  He has had grating in his knee, swelling.  Sometimes when he has gotten up, his knee has caused him to pitch forward.   Recent hardware removal of right knee.  Now presents for total knee replacement right knee.  Xrays with tricompartmental changes refractory to conservative treatment. Past Medical History  Diagnosis Date  . Hyperlipidemia   . Ulcer     stomach 60 years ago- no current problem  . Arthritis     R knee  . Hypertension     Dr Dulce Sellar in [place] is pt's cardiologist.    Past Surgical History  Procedure Date  . Knee arthroscopy     bilateral  . Knee arthroscopy 1989    Right  . Replacement total knee   . Knee reconstruction, medial patellar femoral ligament     "screws and pins inserted" from a fracture  . Prostate surgery     years ago "the Dr microwaved it"  . Hernia repair     per chest X- Ray  . Hardware removal 12/25/2011    Procedure: HARDWARE REMOVAL;  Surgeon: Eldred Manges, MD;  Location: [facility] OR;  Service: Orthopedics;  Laterality: Right;   Hardware Removal Right Femur  . Cardiac catheterization 6 yrs ago    [facility]  . Eye surgery 12/2010    Cataract bil with lens implant, please be careful if washing  eyes    Family History  Problem Relation Age of Onset  . Anesthesia problems Neg Hx    Social History:  reports that he has quit smoking. He quit smokeless tobacco use about 61 years ago. He reports that he drinks about 3.6 ounces of alcohol per week. He reports that he does not use illicit drugs.  Allergies: No Known Allergies  Medications Prior to Admission  Medication Dose Route Frequency Provider Last Rate Last Dose  .  ceFAZolin (ANCEF) IVPB 2 g/50 mL premix  2 g Intravenous 60 min Pre-Op Eldred Manges, MD       Medications Prior to Admission  Medication Sig Dispense Refill  . amLODipine (NORVASC) 10 MG tablet Take 10 mg by mouth daily.      . benazepril (LOTENSIN) 40 MG tablet Take 40 mg by mouth daily.      . carboxymethylcellulose (REFRESH PLUS) 0.5 % SOLN 1 drop 3 (three) times daily as needed. For dry eyes      . hydrochlorothiazide (HYDRODIURIL) 25 MG tablet Take 25 mg by mouth daily.      . Multiple Vitamins-Minerals (PRESERVISION AREDS 2 PO) Take 1 tablet by mouth 2 (two) times daily.      . pravastatin (PRAVACHOL) 40 MG tablet Take 40 mg by mouth daily.        No results found for this or any previous visit (from the past 48 hour(s)). No results found.  Review of Systems  Constitutional: Negative.   HENT: Negative.   Eyes: Negative.   Respiratory: Negative.   Cardiovascular: Negative.   Gastrointestinal: Negative.   Genitourinary: Negative.   Musculoskeletal: Positive for joint pain.  Skin: Negative.   Neurological: Negative.   Endo/Heme/Allergies: Negative.  Psychiatric/Behavioral: Negative.     Blood pressure 121/60, pulse 55, temperature 97.7 F (36.5 C), temperature source Oral, resp. rate 18, SpO2 96.00%. Physical Exam  Constitutional: He is oriented to person, place, and time. He appears well-developed and well-nourished.  HENT:  Head: Normocephalic and atraumatic.  Eyes: EOM are normal. Pupils are equal, round, and reactive to light.  Neck: Normal range of motion. Neck supple.  Cardiovascular: Normal rate and regular rhythm.   Respiratory: Effort normal and breath sounds normal.  GI: Soft. Bowel sounds are normal.  Musculoskeletal:       Crepitus with rom of right knee.  Painful ROM with flexion to 90 degrees. Incision from hardware removal healed. No edema distal  Neurological: He is alert and oriented to person, place, and time.  Skin: Skin is warm and dry.    Psychiatric: He has a normal mood and affect.     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  Tricompartmental changes of right knee with failed conservative treatment.  PLAN:  Total knee replacement right  Briggett Tuccillo M 02/03/2012, 12:06 PM

## 2012-02-03 NOTE — Anesthesia Postprocedure Evaluation (Signed)
Anesthesia Post Note  Patient: Andrew Salas  Procedure(s) Performed: Procedure(s) (LRB): COMPUTER ASSISTED TOTAL KNEE ARTHROPLASTY (Right)  Anesthesia type: general  Patient location: PACU  Post pain: Pain level controlled  Post assessment: Patient's Cardiovascular Status Stable  Last Vitals:  Filed Vitals:   02/03/12 1949  BP:   Pulse: 52  Temp: 36.3 C  Resp: 20    Post vital signs: Reviewed and stable  Level of consciousness: sedated  Complications: No apparent anesthesia complications

## 2012-02-03 NOTE — Progress Notes (Signed)
ANTICOAGULATION CONSULT NOTE - Initial Consult  Pharmacy Consult for Coumadin Indication: VTE prophylaxis  No Known Allergies  Patient Measurements: Wt = 98 kg  Vital Signs: Temp: 97.4 F (36.3 C) (03/20 2004) Temp src: Oral (03/20 0944) BP: 116/69 mmHg (03/20 2004) Pulse Rate: 77  (03/20 2004)  Labs: No results found for this basename: HGB:2,HCT:3,PLT:3,APTT:3,LABPROT:3,INR:3,HEPARINUNFRC:3,CREATININE:3,CKTOTAL:3,CKMB:3,TROPONINI:3 in the last 72 hours The CrCl is unknown because both a height and weight (above a minimum accepted value) are required for this calculation.  Medical History: Past Medical History  Diagnosis Date  . Hyperlipidemia   . Ulcer     stomach 60 years ago- no current problem  . Arthritis     R knee  . Hypertension     Dr Dulce Sellar in [place] is pt's cardiologist.    Medications:  Prescriptions prior to admission  Medication Sig Dispense Refill  . amLODipine (NORVASC) 10 MG tablet Take 10 mg by mouth daily.      . benazepril (LOTENSIN) 40 MG tablet Take 40 mg by mouth daily.      . carboxymethylcellulose (REFRESH PLUS) 0.5 % SOLN 1 drop 3 (three) times daily as needed. For dry eyes      . hydrochlorothiazide (HYDRODIURIL) 25 MG tablet Take 25 mg by mouth daily.      . Multiple Vitamins-Minerals (PRESERVISION AREDS 2 PO) Take 1 tablet by mouth 2 (two) times daily.      Marland Kitchen oxyCODONE-acetaminophen (PERCOCET) 5-325 MG per tablet Take 1 tablet by mouth every 4 (four) hours as needed.      . pravastatin (PRAVACHOL) 40 MG tablet Take 40 mg by mouth daily.        Assessment: 78 year old beginning Coumadin for VTE prophylaxis after TKA  Goal of Therapy:  INR 2-3   Plan:  1) Coumadin 7.5 mg po x 1 dose tonight 2) Daily INR 3) Coumadin book / video  Thank you.  Andrew Salas 02/03/2012,8:49 PM

## 2012-02-03 NOTE — Progress Notes (Signed)
Orthopedic Tech Progress Note Patient Details:  Andrew Salas 06/20/1926 [id number]  CPM Right Knee CPM Right Knee: On Right Knee Flexion (Degrees): 45  Right Knee Extension (Degrees): 0  Trapeze bar patient helper  Nikki Dom 02/03/2012, 6:41 PM

## 2012-02-04 LAB — CBC
Hemoglobin: 10.4 g/dL — ABNORMAL LOW (ref 13.0–17.0)
MCH: 29.8 pg (ref 26.0–34.0)
MCHC: 32.6 g/dL (ref 30.0–36.0)
MCV: 91.4 fL (ref 78.0–100.0)
RBC: 3.49 MIL/uL — ABNORMAL LOW (ref 4.22–5.81)

## 2012-02-04 LAB — BASIC METABOLIC PANEL
BUN: 21 mg/dL (ref 6–23)
CO2: 24 mEq/L (ref 19–32)
Calcium: 8.8 mg/dL (ref 8.4–10.5)
Creatinine, Ser: 1.03 mg/dL (ref 0.50–1.35)
GFR calc non Af Amer: 65 mL/min — ABNORMAL LOW (ref >90)
Glucose, Bld: 160 mg/dL — ABNORMAL HIGH (ref 70–99)

## 2012-02-04 LAB — PROTIME-INR: Prothrombin Time: 13.8 seconds (ref 11.6–15.2)

## 2012-02-04 MED ORDER — WARFARIN SODIUM 7.5 MG PO TABS
7.5000 mg | ORAL_TABLET | Freq: Once | ORAL | Status: AC
  Administered 2012-02-04: 7.5 mg via ORAL
  Filled 2012-02-04: qty 1

## 2012-02-04 MED ORDER — ALUM & MAG HYDROXIDE-SIMETH 200-200-20 MG/5ML PO SUSP
30.0000 mL | ORAL | Status: DC | PRN
  Administered 2012-02-04 (×2): 30 mL via ORAL
  Filled 2012-02-04 (×2): qty 30

## 2012-02-04 NOTE — Progress Notes (Signed)
Physical Therapy Treatment Patient Details Name: Andrew Salas MRN: [id number] DOB: Nov 04, 1926 Today's Date: 02/04/2012  PT Assessment/Plan   PT Goals     PT Treatment Precautions/Restrictions  Precautions Precautions: Knee Precaution Booklet Issued: No Required Braces or Orthoses: Yes Knee Immobilizer: On when out of bed or walking Restrictions Weight Bearing Restrictions: Yes RLE Weight Bearing: Weight bearing as tolerated Mobility (including Balance) Bed Mobility Bed Mobility: Yes Supine to Sit: 5: Supervision;HOB elevated (Comment degrees);With rails Supine to Sit Details (indicate cue type and reason): Cues to scoot up in bed Transfers Transfers: Yes Sit to Stand: 4: Min assist Sit to Stand Details (indicate cue type and reason): Min verbal cues for hand placement and technique Stand to Sit: 4: Min assist Stand to Sit Details: cues for technique Ambulation/Gait Ambulation/Gait: Yes Ambulation/Gait Assistance: 4: Min assist Ambulation/Gait Assistance Details (indicate cue type and reason): cues for safety Ambulation Distance (Feet) (75x2) Assistive device: Rolling walker Gait Pattern: Step-to pattern (Progressed to a step though pattern)    Exercise  Total Joint Exercises Ankle Circles/Pumps: AROM;Right;10 reps;Supine Quad Sets: AROM;Right;10 reps;Supine Hip ABduction/ADduction: AAROM;10 reps;Supine;Right Straight Leg Raises: AAROM;Right;10 reps;Supine End of Session PT - End of Session Equipment Utilized During Treatment: Gait belt;Right knee immobilizer Activity Tolerance: Patient tolerated treatment well Patient left: in chair;with call bell in reach;with family/visitor present  Hortencia Conradi, PTA 02/04/2012, 5:28 PM

## 2012-02-04 NOTE — Evaluation (Signed)
Occupational Therapy Evaluation Patient Details Name: Andrew Salas MRN: [id number] DOB: 04-16-1926 Today's Date: 02/04/2012  Problem List:  Patient Active Problem List  Diagnoses  (none) - all problems resolved or deleted    Past Medical History:  Past Medical History  Diagnosis Date  . Hyperlipidemia   . Ulcer     stomach 60 years ago- no current problem  . Arthritis     R knee  . Hypertension     Dr Dulce Sellar in [place] is pt's cardiologist.   Past Surgical History:  Past Surgical History  Procedure Date  . Knee arthroscopy     bilateral  . Knee arthroscopy 1989    Right  . Replacement total knee   . Knee reconstruction, medial patellar femoral ligament     "screws and pins inserted" from a fracture  . Prostate surgery     years ago "the Dr microwaved it"  . Hernia repair     per chest X- Ray  . Hardware removal 12/25/2011    Procedure: HARDWARE REMOVAL;  Surgeon: Eldred Manges, MD;  Location: [facility] OR;  Service: Orthopedics;  Laterality: Right;   Hardware Removal Right Femur  . Cardiac catheterization 6 yrs ago    [facility]  . Eye surgery 12/2010    Cataract bil with lens implant, please be careful if washing  eyes    OT Assessment/Plan/Recommendation OT Assessment Clinical Impression Statement: pt. presents s/p rt. TKA and with below problem list. Pt. will benefit from skilled OT to increase functional independence with ADLs and get pt. to supervision level at D/C home. OT Recommendation/Assessment: Patient will need skilled OT in the acute care venue OT Problem List: Decreased strength;Decreased activity tolerance;Impaired balance (sitting and/or standing);Decreased safety awareness;Decreased knowledge of use of DME or AE;Pain Barriers to Discharge: None OT Therapy Diagnosis : Generalized weakness;Acute pain OT Plan OT Frequency: Min 2X/week OT Treatment/Interventions: Self-care/ADL training;DME and/or AE instruction;Therapeutic activities;Patient/family  education;Balance training OT Recommendation Follow Up Recommendations: Home health OT;Supervision - Intermittent Equipment Recommended: Tub/shower bench Individuals Consulted Consulted and Agree with Results and Recommendations: Patient OT Goals Acute Rehab OT Goals OT Goal Formulation: With patient Time For Goal Achievement: 7 days ADL Goals Pt Will Perform Lower Body Bathing: with set-up;with supervision;Sit to stand from chair ADL Goal: Lower Body Bathing - Progress: Goal set today Pt Will Perform Lower Body Dressing: with set-up;with supervision;with adaptive equipment;Sit to stand from chair ADL Goal: Lower Body Dressing - Progress: Goal set today Pt Will Transfer to Toilet: with supervision;with DME;Ambulation;3-in-1;Maintaining weight bearing status ADL Goal: Toilet Transfer - Progress: Goal set today Pt Will Perform Tub/Shower Transfer: Tub transfer;with supervision;with DME;Transfer tub bench ADL Goal: Tub/Shower Transfer - Progress: Goal set today  OT Evaluation Precautions/Restrictions  Precautions Precautions: Knee Restrictions Weight Bearing Restrictions: Yes RLE Weight Bearing: Weight bearing as tolerated Prior Functioning Home Living Lives With: Spouse Type of Home: House Home Layout: One level Home Access: Stairs to enter Entrance Stairs-Rails: None Entrance Stairs-Number of Steps: 1 Bathroom Shower/Tub: Forensic scientist: Handicapped height Bathroom Accessibility: Yes How Accessible: Accessible via walker Home Adaptive Equipment: Straight cane;Walker - rolling Prior Function Level of Independence: Independent with basic ADLs;Independent with gait;Independent with transfers Able to Take Stairs?: Yes Driving: Yes Vocation: Retired ADL ADL Eating/Feeding: Performed;Independent Where Assessed - Eating/Feeding: Chair Grooming: Performed;Wash/dry hands;Set up;Minimal assistance Grooming Details (indicate cue type and reason): Min  verbal cues for hand placment on RW Where Assessed - Grooming: Standing at sink Upper Body Bathing:  Simulated;Chest;Left arm;Right arm;Abdomen;Set up Where Assessed - Upper Body Bathing: Sitting, chair Lower Body Bathing: Simulated;Maximal assistance Where Assessed - Lower Body Bathing: Sit to stand from chair Upper Body Dressing: Performed;Minimal assistance Upper Body Dressing Details (indicate cue type and reason): With donning gown Where Assessed - Upper Body Dressing: Sitting, chair Lower Body Dressing: Performed;+1 Total assistance Lower Body Dressing Details (indicate cue type and reason): With don/doff socks and shoes due to pt. unable to reach bil feet to complete Where Assessed - Lower Body Dressing: Sit to stand from bed Toilet Transfer: Simulated;Minimal assistance Toilet Transfer Details (indicate cue type and reason): Min verbal cues for hand placement and technique Toilet Transfer Method: Proofreader: Other (comment) (straight back chair) Toileting - Clothing Manipulation: Simulated;Minimal assistance Where Assessed - Toileting Clothing Manipulation: Sit to stand from 3-in-1 or toilet Toileting - Hygiene: Simulated;Minimal assistance Where Assessed - Toileting Hygiene: Sit to stand from 3-in-1 or toilet Tub/Shower Transfer: Not assessed Equipment Used: Rolling walker Ambulation Related to ADLs: pt. min assist ~75' with RW and min verbal cues for sequencing of step for safety ADL Comments: pt. educated on use of AE for LB ADLs to increase independence with LB tasks and use of tub transfer bench    Cognition Cognition Orientation Level: Oriented X4    Extremity Assessment RUE Assessment RUE Assessment: Within Functional Limits LUE Assessment LUE Assessment: Within Functional Limits Mobility  Bed Mobility Bed Mobility: Yes Supine to Sit: 3: Mod assist;With rails;HOB flat Supine to Sit Details (indicate cue type and reason): With assist to  elevate trunk and for rt. LE scooting EOB Transfers Transfers: Yes Sit to Stand: 3: Mod assist;From bed;With upper extremity assist;From elevated surface Sit to Stand Details (indicate cue type and reason): Mod verbal cues for hand placment and technique Stand to Sit: 4: Min assist;With upper extremity assist;To chair/3-in-1 Stand to Sit Details: Mod verbal cues for use of arm rest and for controlled descent    End of Session OT - End of Session Equipment Utilized During Treatment: Gait belt;Other (comment) (Rt. LE lift shoe) Activity Tolerance: Patient tolerated treatment well Patient left: in chair;with call bell in reach Nurse Communication: Mobility status for transfers General Behavior During Session: [facility] for tasks performed Cognition: [facility] for tasks performed   Cassandria Anger, OTR/L Pager [phone] 02/04/2012, 12:35 PM

## 2012-02-04 NOTE — Progress Notes (Signed)
ANTICOAGULATION CONSULT NOTE - Initial Consult  Pharmacy Consult for Coumadin Indication: VTE prophylaxis s/p R TKA  No Known Allergies  Patient Measurements: Wt = 98 kg  Vital Signs: Temp: 97.4 F (36.3 C) (03/21 0554) BP: 121/61 mmHg (03/21 1327) Pulse Rate: 86  (03/21 1005)  Labs:  [facility] 02/04/12 0545  HGB 10.4*  HCT 31.9*  PLT 183  APTT --  LABPROT 13.8  INR 1.04  HEPARINUNFRC --  CREATININE 1.03  CKTOTAL --  CKMB --  TROPONINI --    Assessment: 78 year old male on Coumadin for VTE prophylaxis after R TKA. INR with no movement past first dose yesterday. No bleeding noted.  Goal of Therapy:  INR 2-3   Plan:  1) Coumadin 7.5 mg po x 1 dose tonight 2) Daily INR  Christoper Fabian, PharmD, BCPS Clinical pharmacist, pager [phone] 02/04/2012,1:37 PM

## 2012-02-04 NOTE — Discharge Instructions (Signed)
Home Health to be provided thru [facility] [phone] 

## 2012-02-04 NOTE — Progress Notes (Signed)
Subjective: 1 Day Post-Op Procedure(s) (LRB): COMPUTER ASSISTED TOTAL KNEE ARTHROPLASTY (Right) Patient reports pain as mild.    Objective: Vital signs in last 24 hours: Temp:  [97.4 F (36.3 C)-98.5 F (36.9 C)] 97.4 F (36.3 C) (03/21 0554) Pulse Rate:  [47-108] 73  (03/21 0554) Resp:  [13-26] 18  (03/21 0554) BP: (96-121)/(50-76) 107/58 mmHg (03/21 0554) SpO2:  [90 %-100 %] 100 % (03/21 0554)  Intake/Output from previous day: 03/20 0701 - 03/21 0700 In: 4215 [P.O.:360; I.V.:3350; IV Piggyback:105] Out: 1800 [Urine:1650; Blood:150] Intake/Output this shift:     Basename 02/04/12 0545  HGB 10.4*    Basename 02/04/12 0545  WBC 11.4*  RBC 3.49*  HCT 31.9*  PLT 183    Basename 02/04/12 0545  NA 136  K 5.0  CL 102  CO2 24  BUN 21  CREATININE 1.03  GLUCOSE 160*  CALCIUM 8.8    Basename 02/04/12 0545  LABPT --  INR 1.04    Neurovascular intact Sensation intact distally Intact pulses distally Dorsiflexion/Plantar flexion intact Incision: dressing C/D/I  Assessment/Plan: 1 Day Post-Op Procedure(s) (LRB): COMPUTER ASSISTED TOTAL KNEE ARTHROPLASTY (Right) Advance diet Up with therapy D/C IV fluids Disposition:  Home with HHPT when stable  Andrew Salas M 02/04/2012, 10:45 AM

## 2012-02-04 NOTE — Progress Notes (Signed)
Physical Therapy Evaluation Patient Details Name: Andrew Salas MRN: [id number] DOB: 01-Nov-1926 Today's Date: 02/04/2012  Problem List:  Patient Active Problem List  Diagnoses  (none) - all problems resolved or deleted    Past Medical History:  Past Medical History  Diagnosis Date  . Hyperlipidemia   . Ulcer     stomach 60 years ago- no current problem  . Arthritis     R knee  . Hypertension     Dr Dulce Sellar in [place] is pt's cardiologist.   Past Surgical History:  Past Surgical History  Procedure Date  . Knee arthroscopy     bilateral  . Knee arthroscopy 1989    Right  . Replacement total knee   . Knee reconstruction, medial patellar femoral ligament     "screws and pins inserted" from a fracture  . Prostate surgery     years ago "the Dr microwaved it"  . Hernia repair     per chest X- Ray  . Hardware removal 12/25/2011    Procedure: HARDWARE REMOVAL;  Surgeon: Eldred Manges, MD;  Location: [facility] OR;  Service: Orthopedics;  Laterality: Right;   Hardware Removal Right Femur  . Cardiac catheterization 6 yrs ago    [facility]  . Eye surgery 12/2010    Cataract bil with lens implant, please be careful if washing  eyes    PT Assessment/Plan/Recommendation PT Assessment Clinical Impression Statement: Pt is an 78 y/o male admitted s/p right TKA along with the below PT problem list.  Pt would benefit from acute PT to maximize independence and faciltate d/c home with HHPT. PT Recommendation/Assessment: Patient will need skilled PT in the acute care venue PT Problem List: Decreased strength;Decreased range of motion;Decreased activity tolerance;Decreased balance;Decreased mobility;Decreased knowledge of use of DME;Decreased knowledge of precautions;Pain Barriers to Discharge: None PT Therapy Diagnosis : Difficulty walking;Acute pain PT Plan PT Frequency: 7X/week PT Treatment/Interventions: DME instruction;Gait training;Functional mobility training;Therapeutic  activities;Balance training;Patient/family education;Therapeutic exercise;Stair training PT Recommendation Follow Up Recommendations: Home health PT Equipment Recommended: None recommended by PT PT Goals  Acute Rehab PT Goals PT Goal Formulation: With patient/family Time For Goal Achievement: 7 days Pt will go Supine/Side to Sit: with modified independence PT Goal: Supine/Side to Sit - Progress: Goal set today Pt will go Sit to Supine/Side: with modified independence PT Goal: Sit to Supine/Side - Progress: Goal set today Pt will go Sit to Stand: with modified independence PT Goal: Sit to Stand - Progress: Goal set today Pt will go Stand to Sit: with modified independence PT Goal: Stand to Sit - Progress: Goal set today Pt will Ambulate: >150 feet;with modified independence;with least restrictive assistive device PT Goal: Ambulate - Progress: Goal set today Pt will Go Up / Down Stairs: 3-5 stairs;with supervision;with least restrictive assistive device PT Goal: Up/Down Stairs - Progress: Goal set today Pt will Perform Home Exercise Program: Independently PT Goal: Perform Home Exercise Program - Progress: Goal set today  PT Evaluation Precautions/Restrictions  Precautions Precautions: Knee Precaution Booklet Issued: No Required Braces or Orthoses: Yes Knee Immobilizer: On when out of bed or walking Restrictions Weight Bearing Restrictions: Yes RLE Weight Bearing: Weight bearing as tolerated Prior Functioning  Home Living Lives With: Spouse Type of Home: House Home Layout: One level Home Access: Stairs to enter Entrance Stairs-Rails: None Entrance Stairs-Number of Steps: 1 Bathroom Shower/Tub: Forensic scientist: Handicapped height Bathroom Accessibility: Yes How Accessible: Accessible via walker Home Adaptive Equipment: Straight cane;Walker - rolling Prior Function Level  of Independence: Independent with basic ADLs;Independent with gait;Independent  with transfers Able to Take Stairs?: Yes Driving: Yes Vocation: Retired Producer, television/film/video: Awake/alert Overall Cognitive Status: Appears within functional limits for tasks assessed Orientation Level: Oriented X4 Sensation/Coordination Sensation Light Touch: Appears Intact Stereognosis: Not tested Hot/Cold: Not tested Coordination Gross Motor Movements are Fluid and Coordinated: Yes Fine Motor Movements are Fluid and Coordinated: Yes Extremity Assessment RUE Assessment RUE Assessment: Within Functional Limits LUE Assessment LUE Assessment: Within Functional Limits RLE Assessment RLE Assessment: Exceptions to [facility] RLE AROM (degrees) Right Knee Extension 0-130: 5  Right Knee Flexion 0-140: 60  RLE Strength RLE Overall Strength: Deficits;Due to pain RLE Overall Strength Comments: 3/5 LLE Assessment LLE Assessment: Within Functional Limits Pain 0/10 with mobility. Mobility (including Balance) Bed Mobility Bed Mobility: Yes Supine to Sit: 3: Mod assist;With rails;HOB flat Supine to Sit Details (indicate cue type and reason): Assist for right LE and trunk with cues for sequence. Transfers Transfers: Yes Sit to Stand: 3: Mod assist;From bed;With upper extremity assist;From elevated surface Sit to Stand Details (indicate cue type and reason): Assist to translate trunk anterior over BOS with cues for sequence and hand placement. Stand to Sit: 4: Min assist;With upper extremity assist;To chair/3-in-1 Stand to Sit Details: Assist to slow descent to chair with cues for hand placement. Ambulation/Gait Ambulation/Gait: Yes Ambulation/Gait Assistance: 4: Min assist Ambulation/Gait Assistance Details (indicate cue type and reason): Assist to off weight right LE with cues for safe sequence inside RW and tall posture. Ambulation Distance (Feet): 75 Feet Assistive device: Rolling walker Gait Pattern: Step-to pattern;Decreased step length - right;Decreased stance time  - right Stairs: No Wheelchair Mobility Wheelchair Mobility: No  Posture/Postural Control Posture/Postural Control: No significant limitations Balance Balance Assessed: No Exercise  Total Joint Exercises Ankle Circles/Pumps: AROM;Right;10 reps;Supine Quad Sets: AROM;Right;10 reps;Supine Heel Slides: Right;10 reps;Supine;AAROM End of Session PT - End of Session Equipment Utilized During Treatment: Gait belt;Right knee immobilizer Activity Tolerance: Patient tolerated treatment well Patient left: in chair;with call bell in reach;with family/visitor present Nurse Communication: Mobility status for transfers;Mobility status for ambulation General Behavior During Session: [facility] for tasks performed Cognition: [facility] for tasks performed  Cephus Shelling 02/04/2012, 1:45 PM  02/04/2012 Cephus Shelling, PT, DPT [phone]

## 2012-02-04 NOTE — Op Note (Signed)
NAME:  Andrew Salas, Andrew Salas NO.:  [id number]  MEDICAL RECORD NO.:  [id number]  LOCATION:  5040                         FACILITY:  [facility]  PHYSICIAN:  Olyver Hawes C. Ophelia Charter, M.D.    DATE OF BIRTH:  [birth year]  DATE OF PROCEDURE:  02/03/2012 DATE OF DISCHARGE:                              OPERATIVE REPORT   PREOPERATIVE DIAGNOSIS:  Posttraumatic right knee osteoarthritis.  POSTOPERATIVE DIAGNOSIS:  Posttraumatic right knee osteoarthritis.  PROCEDURE:  Right cemented total knee arthroplasty, computer assist.  SURGEON:  Jessa Stinson C. Ophelia Charter, M.D.  ANESTHESIA:  GOT.  TOURNIQUET TIME:  An hour and 30 minutes.  EBL:  Less than 100 mL.  ASSISTANT:  Wende Neighbors, PA-C, medically necessary and present for the entire procedure.  DRAINS:  None.  COMPONENTS:  DePuy J and J rotating platform, #4 femur 10-mm poly, #3 tibia and a 35-mm all-poly 3 pegged patella.  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  He has had hardware removal and refixation with lateral anatomic locking plates and medial locking plates with bone grafting and has healed successfully.  This has been at least 5 years since the procedure.  He has healed in a shortened position and has had progressive osteoarthritis of his knee, bone-on-bone changes, failed treatment with anti-inflammatories and bracing, uses a cane and a walker at times, has failed viscosupplement injections and has done a home exercise program after physical therapy, all without relief.  X-rays show bone-on-bone changes, marginal osteophytes, subchondral sclerosis, subchondral cyst formation and loss of joint space on standing films.  PROCEDURE:  After induction of general anesthesia, orotracheal intubation, preoperative femoral block, preoperative Ancef prophylaxis, standard prepping with DuraPrep down to the ankle from proximal tourniquet, lateral  post and heel bump, standard prepping and draping was performed.  For total knee arthroplasty, impervious stockinette, Coban, sterile skin marker on the planned incision that was by as far medial to the midline that had been used in the medial plate.  Betadine, Steri-Drape was applied.  Skin was marked.  Time-out procedure was completed.  Incision was made, medial incision was used and development toward the medial true retinacular parapatellar incision.  Proximally, the incision was curved back to the midline, which left adequate skin bridge with the old lateral incision that extended all the way up to the femur laterally.  Scar tissue was resected.  Spurs removed.  There was extensive scar tissue and resection of the tissue had to be performed before the patella was even visualized.  There is no cartilage left in the patella.  It was actually difficult to identify other than by palpation due to its severe erosive wear.  10-mm slice was taken off the patella from facet-to-facet.  Spurs were removed.  Soft tissue was released.  Gutters had to be recreated since they were scarred down and patella had to be pushed to the side, could not be flipped over due to tightness.  Pins were placed in the femur inside the incision, stab incision made in the mid tibia, and 2 pins were placed.  Computer modules were made of the femur  and tibia.  There was 7 degrees varus, 5- degree knee flexion contracture, and flexion only to 130 degrees with the patient asleep, about 115 preoperatively with him awake.  10 mm resected off the high side.  This only gave a small sliver cut on the lateral condyle and was backed up more due to the patient's mild posttraumatic deformity.  Repeat cut taking a little bit more bone looked good.  It was verified and chamfer cuts were made.  Box cut was not made until the tibia was cut, #4 was good size for the femur, 3 for the tibia.  Keel cut was prepared.  There was extensive  scar tissue difficulty getting to the posterolateral aspect of the tibia.  There was some bone that had been used for the supracondylar femur fracture that was present and was blocking flexion and 3/4 curved osteotome using the posterior condyles, removed posterior spur and removed this excess bone. Meniscal remnants were resected.  Box cut was made in the femur.  Trials were inserted.  10-mm spacer gave excellent restoration.  There is still slight tightness medially with 21 versus 23 mm gaps and MCL was slightly released off of the tibia, which gave balanced fit within 0.5 mm on rechecking.  There were symmetrical gaps in flexion.  Tibia sized at 35, 3-peg holes were drilled.  Pulsatile lavage was used and tibia was cemented first followed by femur and then all-poly, DePuy rotating platform, posterior stabilizer insert.  The patella was cemented on and held.  Once cement was hardened for 15 minutes and all excessive cement had been removed, tourniquet was deflated.  Hemostasis obtained in the standard layered closure with the retinaculum interrupted nonabsorbable figure-of-eight, 2-0 Vicryl in superficial retinaculum, and subcuticular skin closure.  Postop dressing was applied.  Knee immobilizer.  The patient tolerated the procedure well and was transferred to the recovery room in stable condition.  Instrument count and needle count was correct.     [name] C. Ophelia Charter, M.D.     MCY/MEDQ  D:  02/03/2012  T:  02/04/2012  Job:  [id number]

## 2012-02-04 NOTE — Progress Notes (Signed)
CARE MANAGEMENT NOTE 02/04/2012      Action/Plan:   Spoke with patient regarding Home Health needs. Choice offered.States He has rolling walker, Commode. Needs CPM.   Anticipated DC Date:  02/06/2012   Anticipated DC Plan:  HOME W HOME HEALTH SERVICES      DC Planning Services  CM consult      PAC Choice  DURABLE MEDICAL EQUIPMENT  HOME HEALTH   Choice offered to / List presented to:  C-1 Patient   DME arranged  CPM      DME agency  [facility]     HH arranged  HH-2 PT      [facility] agency  [facility]   Status of service:  Completed, signed off  Discharge Disposition:  HOME W HOME HEALTH SERVICES

## 2012-02-05 ENCOUNTER — Encounter (HOSPITAL_COMMUNITY): Payer: Self-pay | Admitting: Orthopaedic Surgery

## 2012-02-05 DIAGNOSIS — M1711 Unilateral primary osteoarthritis, right knee: Secondary | ICD-10-CM

## 2012-02-05 HISTORY — DX: Unilateral primary osteoarthritis, right knee: M17.11

## 2012-02-05 LAB — CBC
HCT: 28.7 % — ABNORMAL LOW (ref 39.0–52.0)
Hemoglobin: 9.1 g/dL — ABNORMAL LOW (ref 13.0–17.0)
MCH: 29.3 pg (ref 26.0–34.0)
MCHC: 31.7 g/dL (ref 30.0–36.0)
MCV: 92.3 fL (ref 78.0–100.0)
RDW: 15 % (ref 11.5–15.5)

## 2012-02-05 MED ORDER — POLYETHYLENE GLYCOL 3350 17 G PO PACK
17.0000 g | PACK | Freq: Every day | ORAL | Status: DC
  Administered 2012-02-05 – 2012-02-06 (×2): 17 g via ORAL
  Filled 2012-02-05 (×2): qty 1

## 2012-02-05 MED ORDER — WARFARIN SODIUM 5 MG PO TABS: 5.0000 mg | ORAL_TABLET | Freq: Every day | ORAL | Status: AC

## 2012-02-05 MED ORDER — WARFARIN SODIUM 7.5 MG PO TABS
7.5000 mg | ORAL_TABLET | Freq: Once | ORAL | Status: AC
  Administered 2012-02-05: 7.5 mg via ORAL
  Filled 2012-02-05 (×2): qty 1

## 2012-02-05 MED ORDER — OXYCODONE-ACETAMINOPHEN 5-325 MG PO TABS: 1.0000 | ORAL_TABLET | ORAL | Status: AC | PRN

## 2012-02-05 MED ORDER — METHOCARBAMOL 500 MG PO TABS: 500.0000 mg | ORAL_TABLET | Freq: Four times a day (QID) | ORAL | Status: AC | PRN

## 2012-02-05 MED ORDER — FLEET ENEMA 7-19 GM/118ML RE ENEM: 1.0000 | ENEMA | Freq: Every day | RECTAL | Status: DC | PRN

## 2012-02-05 NOTE — Progress Notes (Signed)
ANTICOAGULATION CONSULT NOTE - Initial Consult  Pharmacy Consult for Coumadin Indication: VTE prophylaxis s/p R TKA  No Known Allergies  Patient Measurements: Wt = 98 kg  Vital Signs: Temp: 97.2 F (36.2 C) (03/22 0643) BP: 119/63 mmHg (03/22 1018) Pulse Rate: 90  (03/22 0643)  Labs:  Basename 02/05/12 0656 02/04/12 0545  HGB 9.1* 10.4*  HCT 28.7* 31.9*  PLT 170 183  APTT -- --  LABPROT 18.8* 13.8  INR 1.54* 1.04  HEPARINUNFRC -- --  CREATININE -- 1.03  CKTOTAL -- --  CKMB -- --  TROPONINI -- --    Assessment: 78 year old male on Coumadin for VTE prophylaxis after R TKA. INR has responded well to 2 doses of Coumadin.  No bleeding noted.  Goal of Therapy:  INR 2-3   Plan:  1) Coumadin 7.5 mg po x 1 dose tonight 2) Daily INR  Estella Husk, Pharm.D., BCPS Clinical Pharmacist  Pager [phone] 02/05/2012, 10:47 AM

## 2012-02-05 NOTE — Progress Notes (Signed)
Physical Therapy Treatment Patient Details Name: Andrew Salas MRN: [id number] DOB: [birth year] Today's Date: 02/05/2012  PT Assessment/Plan  PT - Assessment/Plan Comments on Treatment Session: Pt admitted s/p right TKA and is progressing.  Pt able to increase ambulation distance/independence today as well as trial stairs. PT Plan: Discharge plan remains appropriate;Frequency remains appropriate PT Frequency: 7X/week Follow Up Recommendations: Home health PT Equipment Recommended: None recommended by PT PT Goals  Acute Rehab PT Goals PT Goal Formulation: With patient/family Time For Goal Achievement: 7 days PT Goal: Supine/Side to Sit - Progress: Progressing toward goal PT Goal: Sit to Stand - Progress: Progressing toward goal PT Goal: Stand to Sit - Progress: Progressing toward goal PT Goal: Ambulate - Progress: Progressing toward goal PT Goal: Up/Down Stairs - Progress: Progressing toward goal PT Goal: Perform Home Exercise Program - Progress: Progressing toward goal  PT Treatment Precautions/Restrictions  Precautions Precautions: Knee Precaution Booklet Issued: No Required Braces or Orthoses: Yes Knee Immobilizer: On when out of bed or walking Restrictions Weight Bearing Restrictions: Yes RLE Weight Bearing: Weight bearing as tolerated Pain No c/o with mobility.  Pt premedicated. Mobility (including Balance) Bed Mobility Bed Mobility: Yes Supine to Sit: 5: Supervision;HOB flat Supine to Sit Details (indicate cue type and reason): Verbal cues for sequence. Transfers Transfers: Yes Sit to Stand: 4: Min assist;With upper extremity assist;From bed;From chair/3-in-1 (2 trials.) Sit to Stand Details (indicate cue type and reason): Assist for balance with cues for safest hand placement. Stand to Sit: 4: Min assist;With upper extremity assist;To chair/3-in-1 (2 trials.) Stand to Sit Details: Assist to slow descent to chair with cues for hand  placement. Ambulation/Gait Ambulation/Gait: Yes Ambulation/Gait Assistance: 4: Min assist (Min (guard)) Ambulation/Gait Assistance Details (indicate cue type and reason): Guarding for balance with cues for hand placement and tall posture. Ambulation Distance (Feet): 140 Feet (100 feet and then 40 feet.) Assistive device: Rolling walker Gait Pattern: Step-to pattern;Decreased step length - right;Decreased stance time - right Stairs: Yes Stairs Assistance: 4: Min assist Stairs Assistance Details (indicate cue type and reason): Assist for balance with cues for "up with good, down with bad." Stair Management Technique: Step to pattern;Backwards;With walker Number of Stairs: 1  Height of Stairs: 4  (inches) Wheelchair Mobility Wheelchair Mobility: No  Posture/Postural Control Posture/Postural Control: No significant limitations Balance Balance Assessed: No Exercise  Total Joint Exercises Ankle Circles/Pumps: AROM;Right;10 reps;Supine Quad Sets: AROM;Right;10 reps;Supine Heel Slides: Right;10 reps;Supine;AAROM Hip ABduction/ADduction: AAROM;10 reps;Supine;Right Straight Leg Raises: AAROM;Right;10 reps;Supine Knee Flexion: AAROM;Right;Limitations (5-30 degrees) Knee Flexion Limitations: 5-30 degrees. End of Session PT - End of Session Equipment Utilized During Treatment: Gait belt;Right knee immobilizer Activity Tolerance: Patient tolerated treatment well Patient left: in chair;with call bell in reach;with family/visitor present Nurse Communication: Mobility status for transfers;Mobility status for ambulation General Behavior During Session: [facility] for tasks performed Cognition: [facility] for tasks performed  Cephus Shelling 02/05/2012, 10:52 AM  02/05/2012 Cephus Shelling, PT, DPT [phone]

## 2012-02-05 NOTE — Progress Notes (Signed)
Subjective: Comfortable.  Complain of constipation and abdominal distention.  Plenty of flatus   Objective: Vital signs in last 24 hours: Temp:  [97 F (36.1 C)-97.8 F (36.6 C)] 97.2 F (36.2 C) (03/22 0643) Pulse Rate:  [72-90] 90  (03/22 0643) Resp:  [18-20] 18  (03/22 0643) BP: (119-133)/(53-63) 119/63 mmHg (03/22 1018) SpO2:  [92 %-97 %] 92 % (03/22 0643)  Intake/Output from previous day: 03/21 0701 - 03/22 0700 In: 500  Out: -  Intake/Output this shift:     Basename 02/05/12 0656 02/04/12 0545  HGB 9.1* 10.4*    Basename 02/05/12 0656 02/04/12 0545  WBC 11.7* 11.4*  RBC 3.11* 3.49*  HCT 28.7* 31.9*  PLT 170 183    Basename 02/04/12 0545  NA 136  K 5.0  CL 102  CO2 24  BUN 21  CREATININE 1.03  GLUCOSE 160*  CALCIUM 8.8    Basename 02/05/12 0656 02/04/12 0545  LABPT -- --  INR 1.54* 1.04    ABD soft Neurovascular intact Sensation intact distally Intact pulses distally Dorsiflexion/Plantar flexion intact Incision: no drainage  Assessment/Plan: POD 2 pain well controlled.  Slow activity.  Plans to go home with wife and HHPT Constipation:  Will give supp today and other agents as needed. ABL anemia:  Stable and pt asymptomatic.  Recheck in am Most likely will be ready for discharge Sunday.  rx on chart   Aslin Farinas M 02/05/2012, 11:01 AM

## 2012-02-05 NOTE — Progress Notes (Signed)
Physical Therapy Treatment Patient Details Name: Andrew Salas MRN: [id number] DOB: 1925-11-19 Today's Date: 02/05/2012  PT Assessment/Plan  PT - Assessment/Plan Comments on Treatment Session: Pt initially eager to participate in therapy.  Upon sitting edge of bed pt reports dizziness which resolved within 2 minutes of sitting.  On standing pt continued to report feeling "a little" dizzy which resolved with 1 minute static stance.  During gait training pt reports "I don't feel like I can do this"  Pt seated to rest.  BP 112/56.  RN made aware.  Pt wheeled back to room in recliner chair to rest. PT Plan: Discharge plan remains appropriate;Frequency needs to be updated PT Frequency: 7X/week Follow Up Recommendations: Home health PT Equipment Recommended: None recommended by PT PT Goals  Acute Rehab PT Goals PT Goal Formulation: With patient/family Time For Goal Achievement: 7 days PT Goal: Supine/Side to Sit - Progress: Progressing toward goal PT Goal: Sit to Stand - Progress: Progressing toward goal PT Goal: Stand to Sit - Progress: Progressing toward goal PT Goal: Ambulate - Progress: Progressing toward goal PT Goal: Up/Down Stairs - Progress: Progressing toward goal PT Goal: Perform Home Exercise Program - Progress: Progressing toward goal  PT Treatment Precautions/Restrictions  Precautions Precautions: Knee Precaution Booklet Issued: No Required Braces or Orthoses: Yes Knee Immobilizer: On when out of bed or walking Restrictions Weight Bearing Restrictions: Yes RLE Weight Bearing: Weight bearing as tolerated Mobility (including Balance) Bed Mobility Bed Mobility: Yes Supine to Sit: 4: Min assist Supine to Sit Details (indicate cue type and reason): assist for LE due to pain Transfers Transfers: Yes Sit to Stand: 4: Min assist Sit to Stand Details (indicate cue type and reason): cues for LE and hand placment, steady assist for balance Stand to Sit: 4: Min assist Stand to  Sit Details: assist for controlled descent Ambulation/Gait Ambulation/Gait: Yes Ambulation/Gait Assistance: 4: Min assist Ambulation/Gait Assistance Details (indicate cue type and reason): guarding assist due to pt with c/o "dizzy" upon initial standing.  Pt with step to pattern, states "I walked better this morning".  Pt with decreased cadence, decreased knee flexion in swing Ambulation Distance (Feet): 40 Feet Assistive device: Rolling walker Gait Pattern: Step-to pattern;Decreased step length - right;Decreased stance time - right Stairs: No End of Session PT - End of Session Equipment Utilized During Treatment: Gait belt;Right knee immobilizer Activity Tolerance: Patient limited by fatigue Patient left: in chair;with call bell in reach;with family/visitor present Nurse Communication: Mobility status for transfers;Mobility status for ambulation General Behavior During Session: [facility] for tasks performed Cognition: [facility] for tasks performed  [facility] 02/05/2012, 2:22 PM

## 2012-02-05 NOTE — Progress Notes (Signed)
Orthopedic Tech Progress Note Patient Details:  Andrew Salas 11-25-1925 [id number]  Patient ID: Andrew Salas, male   DOB: Jun 01, 1926, 78 y.o.   MRN: [id number]  Confirmed patient has knee immobilizer. Leo Grosser T 02/05/2012, 12:51 PM

## 2012-02-06 LAB — BASIC METABOLIC PANEL
BUN: 22 mg/dL (ref 6–23)
CO2: 26 mEq/L (ref 19–32)
Chloride: 105 mEq/L (ref 96–112)
Creatinine, Ser: 1.24 mg/dL (ref 0.50–1.35)
GFR calc Af Amer: 60 mL/min — ABNORMAL LOW (ref >90)
Glucose, Bld: 116 mg/dL — ABNORMAL HIGH (ref 70–99)
Potassium: 4.6 mEq/L (ref 3.5–5.1)

## 2012-02-06 LAB — CBC
HCT: 27.5 % — ABNORMAL LOW (ref 39.0–52.0)
Hemoglobin: 8.8 g/dL — ABNORMAL LOW (ref 13.0–17.0)
MCV: 91.7 fL (ref 78.0–100.0)
RBC: 3 MIL/uL — ABNORMAL LOW (ref 4.22–5.81)
RDW: 14.9 % (ref 11.5–15.5)
WBC: 11.5 10*3/uL — ABNORMAL HIGH (ref 4.0–10.5)

## 2012-02-06 LAB — PROTIME-INR: INR: 1.86 — ABNORMAL HIGH (ref 0.00–1.49)

## 2012-02-06 MED ORDER — MAGNESIUM CITRATE PO SOLN
1.0000 | Freq: Once | ORAL | Status: AC
  Administered 2012-02-06: 1 via ORAL
  Filled 2012-02-06: qty 296

## 2012-02-06 MED ORDER — WARFARIN SODIUM 5 MG PO TABS
5.0000 mg | ORAL_TABLET | Freq: Once | ORAL | Status: AC
  Administered 2012-02-06: 5 mg via ORAL
  Filled 2012-02-06: qty 1

## 2012-02-06 NOTE — Progress Notes (Signed)
ANTICOAGULATION CONSULT NOTE - Initial Consult  Pharmacy Consult for Coumadin Indication: VTE prophylaxis s/p R TKA  No Known Allergies  Patient Measurements: Wt = 98 kg  Vital Signs: Temp: 98.2 F (36.8 C) (03/23 0644) BP: 110/58 mmHg (03/23 0644) Pulse Rate: 69  (03/23 0644)  Labs:  Basename 02/06/12 0500 02/05/12 0656 02/04/12 0545  HGB 8.8* 9.1* --  HCT 27.5* 28.7* 31.9*  PLT 166 170 183  APTT -- -- --  LABPROT 21.8* 18.8* 13.8  INR 1.86* 1.54* 1.04  HEPARINUNFRC -- -- --  CREATININE 1.24 -- 1.03  CKTOTAL -- -- --  CKMB -- -- --  TROPONINI -- -- --    Assessment: 78 year old male on Coumadin for VTE prophylaxis after R TKA. INR has responded well to 3 doses of Coumadin.  No bleeding noted.  Goal of Therapy:  INR 2-3   Plan:  1) Coumadin 5 mg po x 1 dose tonight 2) Daily INR  Maelle Sheaffer, Pharm.D Clinical Pharmacist  Pager [phone] 02/06/2012, 11:30 AM

## 2012-02-06 NOTE — Progress Notes (Signed)
Physical Therapy Treatment Patient Details Name: Andrew Salas MRN: [id number] DOB: [birth year] Today's Date: 02/06/2012  PT Assessment/Plan  PT - Assessment/Plan Comments on Treatment Session: Pt admitted s/p right TKA and is progressing well.  Pt able to continue to increase ambulation distance/independence as well as negotiate one stair again today with increased independence. PT Plan: Discharge plan remains appropriate;Frequency remains appropriate PT Frequency: 7X/week Follow Up Recommendations: Home health PT Equipment Recommended: None recommended by PT PT Goals  Acute Rehab PT Goals PT Goal Formulation: With patient/family Time For Goal Achievement: 7 days PT Goal: Supine/Side to Sit - Progress: Progressing toward goal PT Goal: Sit to Stand - Progress: Progressing toward goal PT Goal: Stand to Sit - Progress: Progressing toward goal PT Goal: Ambulate - Progress: Progressing toward goal PT Goal: Up/Down Stairs - Progress: Progressing toward goal  PT Treatment Precautions/Restrictions  Precautions Precautions: Knee Precaution Booklet Issued: No Required Braces or Orthoses: Yes Knee Immobilizer: On when out of bed or walking Restrictions Weight Bearing Restrictions: Yes RLE Weight Bearing: Weight bearing as tolerated Pain No c/o pain with treatment. Mobility (including Balance) Bed Mobility Bed Mobility: Yes Supine to Sit: 4: Min assist;HOB flat Supine to Sit Details (indicate cue type and reason): Assist for right LE due to weakness. Transfers Transfers: Yes Sit to Stand: 4: Min assist;With upper extremity assist;From bed (Min (guard)) Sit to Stand Details (indicate cue type and reason): Guarding for balance with cues for safest hand placement. Stand to Sit: 4: Min assist;With upper extremity assist;To chair/3-in-1 (Min (guard)) Stand to Sit Details: Guarding for balance with cues for slow descent and safest hand placement. Ambulation/Gait Ambulation/Gait:  Yes Ambulation/Gait Assistance: 4: Min assist (Min (guard)) Ambulation/Gait Assistance Details (indicate cue type and reason): Guarding for balance with cues to progress from step-to sequence to step-through sequence. Ambulation Distance (Feet): 180 Feet Assistive device: Rolling walker Gait Pattern: Step-to pattern;Step-through pattern;Decreased step length - right;Decreased stance time - right Stairs: Yes Stairs Assistance: 4: Min assist (Min (guard)) Stairs Assistance Details (indicate cue type and reason): Guarding for balance with cues for "up with good, down with bad."  Pt able to verbalize sequence prior to attempt with assistance from wife. Stair Management Technique: Step to pattern;Backwards;With walker Number of Stairs: 1  Height of Stairs: 4  (inches) Wheelchair Mobility Wheelchair Mobility: No  Posture/Postural Control Posture/Postural Control: No significant limitations Balance Balance Assessed: No End of Session PT - End of Session Equipment Utilized During Treatment: Gait belt;Right knee immobilizer Activity Tolerance: Patient tolerated treatment well Patient left: in chair;with call bell in reach;with family/visitor present Nurse Communication: Mobility status for transfers;Mobility status for ambulation General Behavior During Session: [facility] for tasks performed Cognition: [facility] for tasks performed  Cephus Shelling 02/06/2012, 12:41 PM  02/06/2012 Cephus Shelling, PT, DPT [phone]

## 2012-02-06 NOTE — Progress Notes (Signed)
Pt stable vss Dressing ok hgb 8.8 - inr 1.86 mag citrate today Dc am

## 2012-02-06 NOTE — Progress Notes (Signed)
PT Treatment Note:   02/06/12 1410  PT Visit Information  Last PT Received On 02/06/12  Precautions  Precautions Knee  Precaution Booklet Issued No  Required Braces or Orthoses Yes  Knee Immobilizer On when out of bed or walking  Restrictions  Weight Bearing Restrictions Yes  RLE Weight Bearing WBAT  Bed Mobility  Bed Mobility No  Transfers  Transfers No  Ambulation/Gait  Ambulation/Gait No  Stairs No  Wheelchair Mobility  Wheelchair Mobility No  Posture/Postural Control  Posture/Postural Control No significant limitations  Balance  Balance Assessed No  Exercises  Exercises Total Joint  Total Joint Exercises  Ankle Circles/Pumps AROM;Right;10 reps;Supine  Quad Sets AROM;Right;10 reps;Supine  Heel Slides Right;10 reps;Supine;AAROM  Hip ABduction/ADduction AAROM;10 reps;Supine;Right  Straight Leg Raises AAROM;Right;10 reps;Supine  Short Arc Quad AAROM;Right;10 reps;Supine  PT - End of Session  Activity Tolerance Patient tolerated treatment well  Patient left in chair;with call bell in reach;with family/visitor present  General  Behavior During Session [facility] for tasks performed  Cognition [facility] for tasks performed  PT - Assessment/Plan  Comments on Treatment Session Pt admitted s/p right TKA and was able to tolerate therapeutic exercises this pm.  Mobility declined due to pt taking laxative and awaiting results currently.  Will continue to follow.  Educated pt to mobilize with RN staff later this pm.'  PT Plan Discharge plan remains appropriate;Frequency remains appropriate  PT Frequency 7X/week  Follow Up Recommendations Home health PT  Equipment Recommended None recommended by PT  Acute Rehab PT Goals  PT Goal Formulation With patient/family  Time For Goal Achievement 7 days  PT Goal: Perform Home Exercise Program - Progress Progressing toward goal   Pain: No c/o pain with treatment.  02/06/2012 Cephus Shelling, PT, DPT [phone]

## 2012-02-07 NOTE — Progress Notes (Signed)
OT Cancellation Note    Treatment cancelled today due to patient's refusal to participate. Pt with c/o pain and asked for therapist to return tomorrow.  Will re-attempt. Thanks.  02/07/2012 Cipriano Mile OTR/L Pager [phone] Office [phone]

## 2012-02-07 NOTE — Progress Notes (Signed)
Occupational Therapy Treatment Patient Details Name: Andrew Salas MRN: [id number] DOB: 12/15/1925 Today's Date: 02/07/2012  OT Assessment/Plan OT Assessment/Plan Equipment Recommended: Tub/shower bench OT Goals Acute Rehab OT Goals OT Goal Formulation: With patient Time For Goal Achievement: 7 days ADL Goals Pt Will Perform Lower Body Bathing: with set-up;with supervision;Sit to stand from chair ADL Goal: Lower Body Bathing - Progress: Progressing toward goals Pt Will Perform Lower Body Dressing: with set-up;with supervision;with adaptive equipment;Sit to stand from chair ADL Goal: Lower Body Dressing - Progress: Progressing toward goals Pt Will Transfer to Toilet: with supervision;with DME;Ambulation;3-in-1;Maintaining weight bearing status ADL Goal: Toilet Transfer - Progress: Met  OT Treatment Precautions/Restrictions  Precautions Precautions: Knee Required Braces or Orthoses: Yes Knee Immobilizer: On when out of bed or walking (pt with active SLR. spoke to Dr. August Saucer, ok to discontinue) Restrictions Weight Bearing Restrictions: Yes RLE Weight Bearing: Weight bearing as tolerated   ADL ADL Lower Body Bathing: Simulated;Minimal assistance Lower Body Bathing Details (indicate cue type and reason): Min assist to reach bil. feet.  Where Assessed - Lower Body Bathing: Sit to stand from bed Lower Body Dressing: Simulated;Minimal assistance Lower Body Dressing Details (indicate cue type and reason): Min assist for don/doff shoes.  Pt able to don pants with supervision. Where Assessed - Lower Body Dressing: Sit to stand from bed Toilet Transfer: Performed;Supervision/safety Toilet Transfer Details (indicate cue type and reason): supervision for safety. Pt demonstrated good technique. Toilet Transfer Method: Proofreader: Set designer - Clothing Manipulation: Simulated;Supervision/safety Toileting - Clothing Manipulation Details (indicate cue  type and reason): Pt pulled pants up over hips with no loss of balance. Where Assessed - Toileting Clothing Manipulation: Standing Equipment Used: Rolling walker ADL Comments: Pt's spouse independent with assisting pt with ADLs.  Discussed with pt safe technique for tub transfer.  Pt states that he plans to sponge bathe intially upon d/c home. Mobility  Bed Mobility Bed Mobility: No Transfers Transfers: Yes Sit to Stand: From bed;With upper extremity assist;5: Supervision Sit to Stand Details (indicate cue type and reason): Supervision for safety. Demonstrated good technique and safe hand placment. Stand to Sit: 6: Modified independent (Device/Increase time);To chair/3-in-1;To bed;With armrests;With upper extremity assist Exercises  End of Session OT - End of Session Equipment Utilized During Treatment: Gait belt Activity Tolerance: Patient tolerated treatment well Patient left: in bed;with call bell in reach;with family/visitor present General Behavior During Session: [facility] for tasks performed Cognition: [facility] for tasks performed   12:31 PM 02/07/2012 Cipriano Mile OTR/L Pager [phone] Office [phone]

## 2012-02-07 NOTE — Progress Notes (Signed)
Physical Therapy Treatment Patient Details Name: AUGUSTIN BUN MRN: [id number] DOB: 09-27-1926 Today's Date: 02/07/2012  PT Assessment/Plan  PT - Assessment/Plan Comments on Treatment Session: Pt just ambulated about 259ft in hall with spouse. Agreeable to ther ex education. Pt plans to discharge home today with spouse assist. He has all needed DME. PT Plan: Discharge plan remains appropriate;Frequency remains appropriate PT Frequency: 7X/week Follow Up Recommendations: Home health PT Equipment Recommended: Tub/shower bench PT Goals  Acute Rehab PT Goals PT Goal: Perform Home Exercise Program - Progress: Progressing toward goal  PT Treatment Precautions/Restrictions  Precautions Precautions: Knee Precaution Booklet Issued: No Required Braces or Orthoses: Yes Knee Immobilizer: On when out of bed or walking (pt with active SLR. spoke to Dr. August Saucer, ok to discontinue) Restrictions Weight Bearing Restrictions: Yes RLE Weight Bearing: Weight bearing as tolerated Mobility (including Balance) Bed Mobility Bed Mobility: No Transfers Transfers: No Sit to Stand: From bed;With upper extremity assist;5: Supervision Sit to Stand Details (indicate cue type and reason): Supervision for safety. Demonstrated good technique and safe hand placment. Stand to Sit: 6: Modified independent (Device/Increase time);To chair/3-in-1;To bed;With armrests;With upper extremity assist Ambulation/Gait Ambulation/Gait: No Stairs: No  Posture/Postural Control Posture/Postural Control: No significant limitations Exercise  Total Joint Exercises Ankle Circles/Pumps: AROM;Both;Supine Quad Sets: AROM;Strengthening;Right;10 reps;Supine Short Arc Quad: AAROM;Strengthening;Right;10 reps;Supine Heel Slides: AAROM;Strengthening;Right;10 reps;Supine (AROM 4-65* flexion) Hip ABduction/ADduction: AROM;Strengthening;Right;10 reps;Supine Straight Leg Raises: AAROM;Strengthening;Right;10 reps;Supine End of Session PT -  End of Session Equipment Utilized During Treatment: Gait belt Activity Tolerance: Patient tolerated treatment well Patient left: in bed;with call bell in reach;with family/visitor present Nurse Communication: Mobility status for transfers;Mobility status for ambulation;Weight bearing status General Behavior During Session: [facility] for tasks performed Cognition: [facility] for tasks performed  Sallyanne Kuster 02/07/2012, 12:32 PM  Sallyanne Kuster, PTA Office- [phone]

## 2012-02-07 NOTE — Progress Notes (Signed)
Subjective: Pt doing well - min pain - amb in hall   Objective: Vital signs in last 24 hours: Temp:  [97.7 F (36.5 C)-98.6 F (37 C)] 98.6 F (37 C) (03/24 0645) Pulse Rate:  [61-108] 108  (03/24 0645) Resp:  [18-20] 18  (03/24 0645) BP: (101-108)/(32-65) 108/65 mmHg (03/24 0645) SpO2:  [90 %-97 %] 90 % (03/24 0645)  Intake/Output from previous day:   Intake/Output this shift:    Exam:  Sensation intact distally Intact pulses distally Dorsiflexion/Plantar flexion intact  Labs:  Basename 02/06/12 0500 02/05/12 0656  HGB 8.8* 9.1*    Basename 02/06/12 0500 02/05/12 0656  WBC 11.5* 11.7*  RBC 3.00* 3.11*  HCT 27.5* 28.7*  PLT 166 170    Basename 02/06/12 0500  NA 138  K 4.6  CL 105  CO2 26  BUN 22  CREATININE 1.24  GLUCOSE 116*  CALCIUM 8.4    Basename 02/07/12 0626 02/06/12 0500  LABPT -- --  INR 1.95* 1.86*    Assessment/Plan: Dc today   Andrew Salas 02/07/2012, 11:04 AM

## 2012-02-15 NOTE — Discharge Summary (Signed)
Physician Discharge Summary  Patient ID: Andrew Salas MRN: [id number] DOB/AGE: [birth year] 78 y.o.  Admit date: 02/03/2012 Discharge date: 02/15/2012  Admission Diagnoses:  Osteoarthritis of right knee  Discharge Diagnoses:  Principal Problem:  *Osteoarthritis of right knee   Past Medical History  Diagnosis Date  . Hyperlipidemia   . Ulcer     stomach 60 years ago- no current problem  . Arthritis     R knee  . Hypertension     Dr Dulce Sellar in [place] is pt's cardiologist.    Surgeries: Procedure(s): COMPUTER ASSISTED TOTAL KNEE ARTHROPLASTY on 02/03/2012   Consultants (if any):  none  Discharged Condition: Improved  Hospital Course: Andrew Salas is an 78 y.o. male who was admitted 02/03/2012 with a diagnosis of Osteoarthritis of right knee and went to the operating room on 02/03/2012 and underwent the above named procedures.    He was given perioperative antibiotics:  Anti-infectives     Start     Dose/Rate Route Frequency Ordered Stop   02/03/12 2300   ceFAZolin (ANCEF) IVPB 1 g/50 mL premix     Comments: Got dose at 1440 in OR      1 g 100 mL/hr over 30 Minutes Intravenous Every 6 hours 02/03/12 2047 02/04/12 1114   02/02/12 1440   ceFAZolin (ANCEF) IVPB 2 g/50 mL premix        2 g 100 mL/hr over 30 Minutes Intravenous 60 min pre-op 02/02/12 1440 02/03/12 1440        .  He was given sequential compression devices, early ambulation, and chemoprophylaxis for DVT prophylaxis.  He benefited maximally from the hospital stay and there were no complications.    Recent vital signs:  Filed Vitals:   02/07/12 0645  BP: 108/65  Pulse: 108  Temp: 98.6 F (37 C)  Resp: 18    Recent laboratory studies:  Lab Results  Component Value Date   HGB 8.8* 02/06/2012   HGB 9.1* 02/05/2012   HGB 10.4* 02/04/2012   Lab Results  Component Value Date   WBC 11.5* 02/06/2012   PLT 166 02/06/2012   Lab Results  Component Value Date   INR 1.95* 02/07/2012   Lab Results   Component Value Date   NA 138 02/06/2012   K 4.6 02/06/2012   CL 105 02/06/2012   CO2 26 02/06/2012   BUN 22 02/06/2012   CREATININE 1.24 02/06/2012   GLUCOSE 116* 02/06/2012    Discharge Medications:   Medication List  As of 02/15/2012 12:58 PM   TAKE these medications         amLODipine 10 MG tablet   Commonly known as: NORVASC   Take 10 mg by mouth daily.      benazepril 40 MG tablet   Commonly known as: LOTENSIN   Take 40 mg by mouth daily.      carboxymethylcellulose 0.5 % Soln   Commonly known as: REFRESH PLUS   1 drop 3 (three) times daily as needed. For dry eyes      hydrochlorothiazide 25 MG tablet   Commonly known as: HYDRODIURIL   Take 25 mg by mouth daily.      methocarbamol 500 MG tablet   Commonly known as: ROBAXIN   Take 1 tablet (500 mg total) by mouth every 6 (six) hours as needed.      oxyCODONE-acetaminophen 5-325 MG per tablet   Commonly known as: PERCOCET   Take 1 tablet by mouth every 4 (four) hours as needed.  oxyCODONE-acetaminophen 5-325 MG per tablet   Commonly known as: PERCOCET   Take 1-2 tablets by mouth every 4 (four) hours as needed.      pravastatin 40 MG tablet   Commonly known as: PRAVACHOL   Take 40 mg by mouth daily.      PRESERVISION AREDS 2 PO   Take 1 tablet by mouth 2 (two) times daily.      warfarin 5 MG tablet   Commonly known as: COUMADIN   Take 1 tablet (5 mg total) by mouth daily.            Diagnostic Studies: X-ray Knee Right Port  02/03/2012  *RADIOLOGY REPORT*  Clinical Data: Post right total knee replacement  PORTABLE RIGHT KNEE - 1-2 VIEW  Comparison: Portable exam 1758 hours compared to intraoperative images of 12/25/2011  Findings: Components of the right knee prosthesis in expected positions. No acute fracture or dislocation identified. Plates and screws at the distal right femur from prior femoral ORIF are unchanged. Superimposed external foreign bodies. Bones appear demineralized.  IMPRESSION: Post right  total knee replacement. Old ORIF distal right femur. No acute abnormalities.  Original Report Authenticated By: Lollie Marrow, M.D.    Disposition: 06-Home-Health Care Svc  Discharge Orders    Future Orders Please Complete By Expires   Diet - low sodium heart healthy      Diet - low sodium heart healthy      Call MD / Call 911      Comments:   If you experience chest pain or shortness of breath, CALL 911 and be transported to the hospital emergency room.  If you develope a fever above 101 F, pus (white drainage) or increased drainage or redness at the wound, or calf pain, call your surgeon's office.   Constipation Prevention      Comments:   Drink plenty of fluids.  Prune juice may be helpful.  You may use a stool softener, such as Colace (over the counter) 100 mg twice a day.  Use MiraLax (over the counter) for constipation as needed.   Increase activity slowly as tolerated      Weight Bearing as taught in Physical Therapy      Comments:   Use a walker or crutches as instructed.   Discharge instructions      Comments:   CHANGE DRESSING DAILY OR AS NEEDED.  ICE PACKS DAILY.  WORK ON RANGE OF MOTION, STRETCHING AND STRENGTHENING EXERCISES DAILY.  WALK AS TOLERATED.   Do not put a pillow under the knee. Place it under the heel.      TED hose      Comments:   Use stockings (TED hose) for 4 weeks on BOTH leg(s).  You may remove them at night for sleeping.   Call MD / Call 911      Comments:   If you experience chest pain or shortness of breath, CALL 911 and be transported to the hospital emergency room.  If you develope a fever above 101 F, pus (white drainage) or increased drainage or redness at the wound, or calf pain, call your surgeon's office.   Constipation Prevention      Comments:   Drink plenty of fluids.  Prune juice may be helpful.  You may use a stool softener, such as Colace (over the counter) 100 mg twice a day.  Use MiraLax (over the counter) for constipation as needed.    Increase activity slowly as tolerated  Weight Bearing as taught in Physical Therapy      Comments:   Use a walker or crutches as instructed.      Follow-up Information    Follow up with Eldred Manges, MD. Schedule an appointment as soon as possible for a visit in 2 weeks. (POST OP)    Contact information:   [facility] [address] [place] [address] [phone]           Signed: Wende Neighbors 02/15/2012, 12:58 PM

## 2012-05-13 ENCOUNTER — Encounter (INDEPENDENT_AMBULATORY_CARE_PROVIDER_SITE_OTHER): Payer: Medicare Other | Admitting: Ophthalmology

## 2012-05-13 DIAGNOSIS — H3530 Unspecified macular degeneration: Secondary | ICD-10-CM

## 2012-05-13 DIAGNOSIS — H43819 Vitreous degeneration, unspecified eye: Secondary | ICD-10-CM

## 2012-08-29 ENCOUNTER — Encounter (INDEPENDENT_AMBULATORY_CARE_PROVIDER_SITE_OTHER): Payer: Medicare Other | Admitting: Ophthalmology

## 2012-08-29 DIAGNOSIS — H35039 Hypertensive retinopathy, unspecified eye: Secondary | ICD-10-CM

## 2012-08-29 DIAGNOSIS — H43819 Vitreous degeneration, unspecified eye: Secondary | ICD-10-CM

## 2012-08-29 DIAGNOSIS — I10 Essential (primary) hypertension: Secondary | ICD-10-CM

## 2012-08-29 DIAGNOSIS — H3530 Unspecified macular degeneration: Secondary | ICD-10-CM

## 2013-02-10 ENCOUNTER — Ambulatory Visit (INDEPENDENT_AMBULATORY_CARE_PROVIDER_SITE_OTHER): Payer: Medicare Other | Admitting: Ophthalmology

## 2013-05-29 ENCOUNTER — Ambulatory Visit (INDEPENDENT_AMBULATORY_CARE_PROVIDER_SITE_OTHER): Payer: Medicare Other | Admitting: Ophthalmology

## 2013-05-29 DIAGNOSIS — H3530 Unspecified macular degeneration: Secondary | ICD-10-CM

## 2013-05-29 DIAGNOSIS — I10 Essential (primary) hypertension: Secondary | ICD-10-CM

## 2013-05-29 DIAGNOSIS — H43819 Vitreous degeneration, unspecified eye: Secondary | ICD-10-CM

## 2013-05-29 DIAGNOSIS — H35039 Hypertensive retinopathy, unspecified eye: Secondary | ICD-10-CM

## 2014-03-12 ENCOUNTER — Ambulatory Visit (INDEPENDENT_AMBULATORY_CARE_PROVIDER_SITE_OTHER): Payer: Medicare Other | Admitting: Ophthalmology

## 2015-01-02 DIAGNOSIS — Z23 Encounter for immunization: Secondary | ICD-10-CM | POA: Diagnosis not present

## 2015-01-10 DIAGNOSIS — Z23 Encounter for immunization: Secondary | ICD-10-CM | POA: Diagnosis not present

## 2015-01-14 DIAGNOSIS — D0461 Carcinoma in situ of skin of right upper limb, including shoulder: Secondary | ICD-10-CM | POA: Diagnosis not present

## 2015-01-14 DIAGNOSIS — L219 Seborrheic dermatitis, unspecified: Secondary | ICD-10-CM | POA: Diagnosis not present

## 2015-01-14 DIAGNOSIS — L570 Actinic keratosis: Secondary | ICD-10-CM | POA: Diagnosis not present

## 2015-03-28 DIAGNOSIS — J208 Acute bronchitis due to other specified organisms: Secondary | ICD-10-CM | POA: Diagnosis not present

## 2015-03-28 DIAGNOSIS — Z6833 Body mass index (BMI) 33.0-33.9, adult: Secondary | ICD-10-CM | POA: Diagnosis not present

## 2015-04-10 DIAGNOSIS — R7301 Impaired fasting glucose: Secondary | ICD-10-CM | POA: Diagnosis not present

## 2015-04-10 DIAGNOSIS — E785 Hyperlipidemia, unspecified: Secondary | ICD-10-CM | POA: Diagnosis not present

## 2015-04-10 DIAGNOSIS — I10 Essential (primary) hypertension: Secondary | ICD-10-CM | POA: Diagnosis not present

## 2015-04-10 DIAGNOSIS — Z6832 Body mass index (BMI) 32.0-32.9, adult: Secondary | ICD-10-CM | POA: Diagnosis not present

## 2015-04-10 DIAGNOSIS — N183 Chronic kidney disease, stage 3 (moderate): Secondary | ICD-10-CM | POA: Diagnosis not present

## 2015-05-21 DIAGNOSIS — Z6832 Body mass index (BMI) 32.0-32.9, adult: Secondary | ICD-10-CM | POA: Diagnosis not present

## 2015-05-21 DIAGNOSIS — J329 Chronic sinusitis, unspecified: Secondary | ICD-10-CM | POA: Diagnosis not present

## 2015-05-31 DIAGNOSIS — M5442 Lumbago with sciatica, left side: Secondary | ICD-10-CM | POA: Diagnosis not present

## 2015-05-31 DIAGNOSIS — Z6832 Body mass index (BMI) 32.0-32.9, adult: Secondary | ICD-10-CM | POA: Diagnosis not present

## 2015-06-05 DIAGNOSIS — Z6831 Body mass index (BMI) 31.0-31.9, adult: Secondary | ICD-10-CM | POA: Diagnosis not present

## 2015-06-05 DIAGNOSIS — M5442 Lumbago with sciatica, left side: Secondary | ICD-10-CM | POA: Diagnosis not present

## 2015-06-05 DIAGNOSIS — M4316 Spondylolisthesis, lumbar region: Secondary | ICD-10-CM | POA: Diagnosis not present

## 2015-06-11 DIAGNOSIS — M79672 Pain in left foot: Secondary | ICD-10-CM | POA: Diagnosis not present

## 2015-06-11 DIAGNOSIS — M79605 Pain in left leg: Secondary | ICD-10-CM | POA: Diagnosis not present

## 2015-06-11 DIAGNOSIS — I700 Atherosclerosis of aorta: Secondary | ICD-10-CM | POA: Diagnosis not present

## 2015-06-11 DIAGNOSIS — M5489 Other dorsalgia: Secondary | ICD-10-CM | POA: Diagnosis not present

## 2015-06-14 DIAGNOSIS — M7062 Trochanteric bursitis, left hip: Secondary | ICD-10-CM | POA: Diagnosis not present

## 2015-06-14 DIAGNOSIS — M4726 Other spondylosis with radiculopathy, lumbar region: Secondary | ICD-10-CM | POA: Diagnosis not present

## 2015-06-14 DIAGNOSIS — M4317 Spondylolisthesis, lumbosacral region: Secondary | ICD-10-CM | POA: Diagnosis not present

## 2015-06-14 DIAGNOSIS — I700 Atherosclerosis of aorta: Secondary | ICD-10-CM | POA: Diagnosis not present

## 2015-06-14 DIAGNOSIS — Z6831 Body mass index (BMI) 31.0-31.9, adult: Secondary | ICD-10-CM | POA: Diagnosis not present

## 2015-07-16 DIAGNOSIS — M7062 Trochanteric bursitis, left hip: Secondary | ICD-10-CM | POA: Diagnosis not present

## 2015-07-16 DIAGNOSIS — M4317 Spondylolisthesis, lumbosacral region: Secondary | ICD-10-CM | POA: Diagnosis not present

## 2015-07-23 DIAGNOSIS — M479 Spondylosis, unspecified: Secondary | ICD-10-CM | POA: Diagnosis not present

## 2015-07-23 DIAGNOSIS — M5126 Other intervertebral disc displacement, lumbar region: Secondary | ICD-10-CM | POA: Diagnosis not present

## 2015-08-06 DIAGNOSIS — M4317 Spondylolisthesis, lumbosacral region: Secondary | ICD-10-CM | POA: Diagnosis not present

## 2015-08-06 DIAGNOSIS — M7062 Trochanteric bursitis, left hip: Secondary | ICD-10-CM | POA: Diagnosis not present

## 2015-08-19 DIAGNOSIS — M4316 Spondylolisthesis, lumbar region: Secondary | ICD-10-CM | POA: Diagnosis not present

## 2015-08-19 DIAGNOSIS — M5416 Radiculopathy, lumbar region: Secondary | ICD-10-CM | POA: Diagnosis not present

## 2015-08-19 DIAGNOSIS — M4306 Spondylolysis, lumbar region: Secondary | ICD-10-CM | POA: Diagnosis not present

## 2015-09-10 DIAGNOSIS — M4306 Spondylolysis, lumbar region: Secondary | ICD-10-CM | POA: Diagnosis not present

## 2015-09-10 DIAGNOSIS — M5416 Radiculopathy, lumbar region: Secondary | ICD-10-CM | POA: Diagnosis not present

## 2015-09-10 DIAGNOSIS — M4316 Spondylolisthesis, lumbar region: Secondary | ICD-10-CM | POA: Diagnosis not present

## 2015-09-26 DIAGNOSIS — L821 Other seborrheic keratosis: Secondary | ICD-10-CM | POA: Diagnosis not present

## 2015-09-26 DIAGNOSIS — B351 Tinea unguium: Secondary | ICD-10-CM | POA: Diagnosis not present

## 2015-09-26 DIAGNOSIS — L570 Actinic keratosis: Secondary | ICD-10-CM | POA: Diagnosis not present

## 2015-10-07 DIAGNOSIS — Z23 Encounter for immunization: Secondary | ICD-10-CM | POA: Diagnosis not present

## 2015-10-07 DIAGNOSIS — S41102A Unspecified open wound of left upper arm, initial encounter: Secondary | ICD-10-CM | POA: Diagnosis not present

## 2015-10-07 DIAGNOSIS — Z6832 Body mass index (BMI) 32.0-32.9, adult: Secondary | ICD-10-CM | POA: Diagnosis not present

## 2015-10-07 DIAGNOSIS — E669 Obesity, unspecified: Secondary | ICD-10-CM | POA: Diagnosis not present

## 2015-10-16 DIAGNOSIS — N183 Chronic kidney disease, stage 3 (moderate): Secondary | ICD-10-CM | POA: Diagnosis not present

## 2015-10-16 DIAGNOSIS — Z23 Encounter for immunization: Secondary | ICD-10-CM | POA: Diagnosis not present

## 2015-10-16 DIAGNOSIS — I10 Essential (primary) hypertension: Secondary | ICD-10-CM | POA: Diagnosis not present

## 2015-10-16 DIAGNOSIS — Z9181 History of falling: Secondary | ICD-10-CM | POA: Diagnosis not present

## 2015-10-16 DIAGNOSIS — Z1389 Encounter for screening for other disorder: Secondary | ICD-10-CM | POA: Diagnosis not present

## 2015-10-16 DIAGNOSIS — R7301 Impaired fasting glucose: Secondary | ICD-10-CM | POA: Diagnosis not present

## 2015-10-16 DIAGNOSIS — E785 Hyperlipidemia, unspecified: Secondary | ICD-10-CM | POA: Diagnosis not present

## 2016-04-27 DIAGNOSIS — R7301 Impaired fasting glucose: Secondary | ICD-10-CM | POA: Diagnosis not present

## 2016-04-27 DIAGNOSIS — D649 Anemia, unspecified: Secondary | ICD-10-CM | POA: Diagnosis not present

## 2016-04-27 DIAGNOSIS — N3289 Other specified disorders of bladder: Secondary | ICD-10-CM | POA: Diagnosis not present

## 2016-04-27 DIAGNOSIS — I10 Essential (primary) hypertension: Secondary | ICD-10-CM | POA: Diagnosis not present

## 2016-04-27 DIAGNOSIS — N3281 Overactive bladder: Secondary | ICD-10-CM | POA: Diagnosis not present

## 2016-10-16 DIAGNOSIS — G061 Intraspinal abscess and granuloma: Secondary | ICD-10-CM

## 2016-10-16 HISTORY — DX: Intraspinal abscess and granuloma: G06.1

## 2016-10-26 ENCOUNTER — Telehealth (INDEPENDENT_AMBULATORY_CARE_PROVIDER_SITE_OTHER): Payer: Self-pay | Admitting: Physical Medicine and Rehabilitation

## 2016-10-26 NOTE — Telephone Encounter (Signed)
Patient is requesting another back injection says its been a couple years since his last one.  Cb#: [phone]

## 2016-10-27 NOTE — Telephone Encounter (Signed)
Tried to call pt and got no answer. VM not set up yet so unable to leave message.

## 2016-10-27 NOTE — Telephone Encounter (Signed)
Had Lt L4 TF 08/19/15. Ok to repeat?

## 2016-10-27 NOTE — Telephone Encounter (Signed)
Called pt and scheduled him for 11/04/16 @ 1:30 w/driver and no blood thinners

## 2016-10-27 NOTE — Telephone Encounter (Signed)
If no trauma etc then would be ok with consult and injection same day, repeat

## 2016-11-04 ENCOUNTER — Encounter (INDEPENDENT_AMBULATORY_CARE_PROVIDER_SITE_OTHER): Payer: Self-pay | Admitting: Physical Medicine and Rehabilitation

## 2016-11-04 ENCOUNTER — Ambulatory Visit (INDEPENDENT_AMBULATORY_CARE_PROVIDER_SITE_OTHER): Payer: Medicare Other | Admitting: Physical Medicine and Rehabilitation

## 2016-11-04 VITALS — BP 176/98 | HR 80 | Temp 97.8°F

## 2016-11-04 DIAGNOSIS — M5416 Radiculopathy, lumbar region: Secondary | ICD-10-CM | POA: Diagnosis not present

## 2016-11-04 MED ORDER — METHYLPREDNISOLONE ACETATE 80 MG/ML IJ SUSP
80.0000 mg | Freq: Once | INTRAMUSCULAR | Status: AC
Start: 2016-11-04 — End: 2016-11-04
  Administered 2016-11-04: 80 mg

## 2016-11-04 MED ORDER — LIDOCAINE HCL (PF) 1 % IJ SOLN: 0.3300 mL | Freq: Once | INTRAMUSCULAR | Status: DC

## 2016-11-04 NOTE — Progress Notes (Signed)
Andrew Salas - 82 y.o. male MRN [id number]  Date of birth: Mar 14, 1926  Office Visit Note: Visit Date: 11/04/2016 PCP: No primary care provider on file. Referred by: No ref. provider found  Subjective: Chief Complaint  Patient presents with  . Lower Back - Pain   HPI: [name] is an 82 year old gentleman that I saw about a year and a half to 2 years ago and completed left L4 transforaminal injection with almost miraculous symptomatic relief of his low back and hip pain. He was followed by Dr. Ophelia CharterYates. 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 He comes in today with his wife who provides a lot of the history. 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 Nonetheless he is having a lot of low back pain centered really at about the lumbosacral junction. Is a little bit more left than right but it clearly is more in the pelvic area than just the low back. He denies any pain down the legs or paresthesias. He has been going on for several weeks without specific injury. He denies any paresthesias or focal weakness. He's had no new injury or falls. 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 He rates his pain is quite severe and is worse with standing and ambulate better at rest.   Increased lower back for the last several weeks. States he had great relief with last injection. Denies leg pain.   Review of Systems  Constitutional: Negative for chills, fever, malaise/fatigue and weight loss.  HENT: Negative for hearing loss and sinus pain.   Eyes: Negative for blurred vision, double vision and photophobia.  Respiratory: Negative for cough and shortness of breath.   Cardiovascular: Negative for chest pain, palpitations and leg swelling.  Gastrointestinal: Negative for abdominal pain, nausea and vomiting.  Genitourinary: Negative for flank pain.  Musculoskeletal: Positive for back pain. Negative for myalgias.    Skin: Negative for itching and rash.  Neurological: Negative for tremors, focal weakness and weakness.  Endo/Heme/Allergies: Negative.   Psychiatric/Behavioral: Negative for depression.  All other systems reviewed and are negative.  Otherwise per HPI.  Assessment & Plan: Visit Diagnoses:  1. Lumbar radiculopathy     Plan: Findings:  Chronic worsening axial lumbosacral pain that I still feel is probably related to the stenosis just in terms of location. He does not have any radicular pain down the legs. He does have some neuropathy and he is unsteady on his gait he does use a cane for ambulation. His pain is quite severe. I think an epidural injection again repeat left L4 transforaminal injection for the stenosis is at least good from a diagnostic standpoint. If it doesn't seem to help very much that obviously we would regroup with morbid evaluation or have him seen by Dr. Ophelia CharterYates. I would not change in his medications at this point. We've encouraged him to be active but safe while ambulating. We'll see if he does with the injection. It was done today due to the severity of the symptoms.    Meds & Orders:  Meds ordered this encounter  Medications  . lidocaine (PF) (XYLOCAINE) 1 % injection 0.3 mL  . methylPREDNISolone acetate (DEPO-MEDROL) injection 80 mg    Orders Placed This Encounter  Procedures  . Epidural Steroid injection    Follow-up: No Follow-up on file.   Procedures: No procedures performed  Lumbosacral Transforaminal Epidural Steroid Injection -  Infraneural Approach with Fluoroscopic Guidance  Patient: Andrew Salas      Date of Birth: 09/21/1926 MRN: [id number] PCP: No primary care provider on file.      Visit Date: 11/04/2016   Universal Protocol:    Date/Time: 12/20/171:54 PM  Consent Given By: the patient  Position: PRONE   Additional Comments: Vital signs were monitored before and after the procedure. Patient was prepped and draped in the usual sterile  fashion. The correct patient, procedure, and site was verified.   Injection Procedure Details:  Procedure Site One Meds Administered:  Meds ordered this encounter  Medications  . lidocaine (PF) (XYLOCAINE) 1 % injection 0.3 mL  . methylPREDNISolone acetate (DEPO-MEDROL) injection 80 mg      Laterality: Left  Location/Site:  L4-L5  Needle size: 22 G  Needle type: Spinal  Needle Placement: Transforaminal  Findings:  -Contrast Used: 1 mL iohexol 180 mg iodine/mL   -Comments: Excellent flow of contrast along the nerve and into the epidural space.  Procedure Details: After squaring off the end-plates of the desired vertebral level to get a true AP view, the C-arm was obliqued to the painful side so that the superior articulating process is positioned about 1/3 the length of the inferior endplate.  The needle was aimed toward the junction of the superior articular process and the transverse process of the inferior vertebrae. The needle's initial entry is in the lower third of the foramen through Kambin's triangle. The soft tissues overlying this target were infiltrated with 2-3 ml. of 1% Lidocaine without Epinephrine.  The spinal needle was then inserted and advanced toward the target using a "trajectory" view along the fluoroscope beam.  Under AP and lateral visualization, the needle was advanced so it did not puncture dura and did not traverse medially beyond the 6 o'clock position of the pedicle. Bi-planar projections were used to confirm position. Aspiration was confirmed to be negative for CSF and/or blood. A 1-2 ml. volume of Isovue-250 was injected and flow of contrast was noted at each level. Radiographs were obtained for documentation purposes.   After attaining the desired flow of contrast documented above, a 0.5 to 1.0 ml test dose of 0.25% Marcaine was injected into each respective transforaminal space.  The patient was observed for 90 seconds post injection.  After no sensory  deficits were reported, and normal lower extremity motor function was noted,   the above injectate was administered so that equal amounts of the injectate were placed at each foramen (level) into the transforaminal epidural space.   Additional Comments:  The patient tolerated the procedure well Dressing: Band-Aid    Post-procedure details: Patient was observed during the procedure. Post-procedure instructions were reviewed.  Patient left the clinic in stable condition.     Clinical History: No specialty comments available.  He reports that he has quit smoking. He quit after 6.00 years of use. He quit smokeless tobacco use about 66 years ago. No results for input(s): HGBA1C, LABURIC in the last 8760 hours.  Objective:  VS:  HT:    WT:   BMI:     BP:(!) 176/98  HR:80bpm  TEMP:97.8 F (36.6 C)(Oral)  RESP:98 % Physical Exam  Constitutional: He is oriented to person, place, and time. He appears well-developed and well-nourished. No distress.  HENT:  Head: Normocephalic and atraumatic.  Eyes: Conjunctivae are normal. Pupils are equal, round, and reactive to light.  Cardiovascular: Regular rhythm and intact distal pulses.   Pulmonary/Chest: Effort normal and  breath sounds normal.  Musculoskeletal:  The patient wears significant orthopedic shoes and appears in some level of Charcot joint issues. He does ambulate with somewhat of a steppage gait and with some balance difficulties. He has good strength with leg extension and flexion. He has no pain with hip rotation. He does ambulate with a forward flexed spine is very stiff with trying to extend.  Neurological: He is alert and oriented to person, place, and time.  Skin: Skin is warm.  Psychiatric: He has a normal mood and affect.    Ortho Exam Imaging: No results found.  Past Medical/Family/Surgical/Social History: Medications & Allergies reviewed per EMR Patient Active Problem List   Diagnosis Date Noted  . Osteoarthritis of  right knee 02/05/2012    Class: End Stage   Past Medical History:  Diagnosis Date  . Arthritis    R knee  . Hyperlipidemia   . Hypertension    Dr Dulce [place]unley in [place] pt's cardiologist.  . Ulcer (HCC)    stomach 60 years ago- no current problem   Family History  Problem Relation Age of Onset  . Anesthesia problems Neg Hx    Past Surgical History:  Procedure Laterality Date  . CARDIAC CATHETERIZATION  6 yrs ago   [facility]  . EYE SURGERY  12/2010   Cataract bil with lens implant, please be careful if washing  eyes  . HARDWARE REMOVAL  12/25/2011   Procedure: HARDWARE REMOVAL;  Surgeon: Eldred MangesMark C Yates, MD;  [facility];  Service: Orthopedics;  Laterality: Right;   Hardware Removal Right Femur  . HERNIA REPAIR     per chest X- Ray  . KNEE ARTHROPLASTY  02/03/2012   Procedure: COMPUTER ASSISTED TOTAL KNEE ARTHROPLASTY;  Surgeon: Eldred MangesMark C Yates, MD;  Location: [facility] OR;  Service: Orthopedics;  Laterality: Right;  Right Total Knee Arthroplasty, Cemented  . KNEE ARTHROSCOPY     bilateral  . KNEE ARTHROSCOPY  1989   Right  . KNEE RECONSTRUCTION, MEDIAL PATELLAR FEMORAL LIGAMENT     "screws and pins inserted" from a fracture  . PROSTATE SURGERY     years ago "the Dr microwaved it"  . REPLACEMENT TOTAL KNEE     Social History   Occupational History  . Not on file.   Social History Main Topics  . Smoking status: Former Smoker    Years: 6.00  . Smokeless tobacco: Former NeurosurgeonUser    Quit date: 11/16/1950  . Alcohol use 3.6 oz/week    6 Glasses of wine per week  . Drug use: No  . Sexual activity: Not on file

## 2016-11-04 NOTE — Procedures (Signed)
Lumbosacral Transforaminal Epidural Steroid Injection - Infraneural Approach with Fluoroscopic Guidance  Patient: Andrew Salas      Date of Birth: 01/15/1926 MRN: [id number] PCP: No primary care provider on file.      Visit Date: 11/04/2016   Universal Protocol:    Date/Time: 12/20/171:54 PM  Consent Given By: the patient  Position: PRONE   Additional Comments: Vital signs were monitored before and after the procedure. Patient was prepped and draped in the usual sterile fashion. The correct patient, procedure, and site was verified.   Injection Procedure Details:  Procedure Site One Meds Administered:  Meds ordered this encounter  Medications  . lidocaine (PF) (XYLOCAINE) 1 % injection 0.3 mL  . methylPREDNISolone acetate (DEPO-MEDROL) injection 80 mg      Laterality: Left  Location/Site:  L4-L5  Needle size: 22 G  Needle type: Spinal  Needle Placement: Transforaminal  Findings:  -Contrast Used: 1 mL iohexol 180 mg iodine/mL   -Comments: Excellent flow of contrast along the nerve and into the epidural space.  Procedure Details: After squaring off the end-plates of the desired vertebral level to get a true AP view, the C-arm was obliqued to the painful side so that the superior articulating process is positioned about 1/3 the length of the inferior endplate.  The needle was aimed toward the junction of the superior articular process and the transverse process of the inferior vertebrae. The needle's initial entry is in the lower third of the foramen through Kambin's triangle. The soft tissues overlying this target were infiltrated with 2-3 ml. of 1% Lidocaine without Epinephrine.  The spinal needle was then inserted and advanced toward the target using a "trajectory" view along the fluoroscope beam.  Under AP and lateral visualization, the needle was advanced so it did not puncture dura and did not traverse medially beyond the 6 o'clock position of the pedicle.  Bi-planar projections were used to confirm position. Aspiration was confirmed to be negative for CSF and/or blood. A 1-2 ml. volume of Isovue-250 was injected and flow of contrast was noted at each level. Radiographs were obtained for documentation purposes.   After attaining the desired flow of contrast documented above, a 0.5 to 1.0 ml test dose of 0.25% Marcaine was injected into each respective transforaminal space.  The patient was observed for 90 seconds post injection.  After no sensory deficits were reported, and normal lower extremity motor function was noted,   the above injectate was administered so that equal amounts of the injectate were placed at each foramen (level) into the transforaminal epidural space.   Additional Comments:  The patient tolerated the procedure well Dressing: Band-Aid    Post-procedure details: Patient was observed during the procedure. Post-procedure instructions were reviewed.  Patient left the clinic in stable condition.

## 2016-11-04 NOTE — Patient Instructions (Signed)

## 2016-11-06 ENCOUNTER — Telehealth (INDEPENDENT_AMBULATORY_CARE_PROVIDER_SITE_OTHER): Payer: Self-pay | Admitting: Physical Medicine and Rehabilitation

## 2016-11-06 DIAGNOSIS — R0602 Shortness of breath: Secondary | ICD-10-CM | POA: Diagnosis not present

## 2016-11-06 DIAGNOSIS — R410 Disorientation, unspecified: Secondary | ICD-10-CM | POA: Diagnosis not present

## 2016-11-06 DIAGNOSIS — I77810 Thoracic aortic ectasia: Secondary | ICD-10-CM | POA: Diagnosis not present

## 2016-11-06 DIAGNOSIS — R55 Syncope and collapse: Secondary | ICD-10-CM | POA: Diagnosis not present

## 2016-11-06 DIAGNOSIS — K449 Diaphragmatic hernia without obstruction or gangrene: Secondary | ICD-10-CM | POA: Diagnosis not present

## 2016-11-06 DIAGNOSIS — R05 Cough: Secondary | ICD-10-CM | POA: Diagnosis not present

## 2016-11-06 NOTE — Telephone Encounter (Signed)
Called in Rx to his pharmacy; tried to leave message but his VM box hasn't been set up yet

## 2016-11-06 NOTE — Telephone Encounter (Signed)
Looked at fluoro images and looked ok. Can he follow up with Ophelia CharterYates today, he is in office and this gentleman is his patient. He does have severe stenosis on prior imaging.

## 2016-11-06 NOTE — Telephone Encounter (Signed)
Call in to [facility] [place],    [place]  # 40  1 po tid prn  Pain.  He will call if not better after the holidays.

## 2016-11-06 NOTE — Telephone Encounter (Signed)
Please advise. Do you want us to bring him in this afternoon or call him in something for pain?

## 2016-11-07 DIAGNOSIS — G9341 Metabolic encephalopathy: Secondary | ICD-10-CM | POA: Diagnosis not present

## 2016-11-07 DIAGNOSIS — R410 Disorientation, unspecified: Secondary | ICD-10-CM | POA: Diagnosis not present

## 2016-11-07 DIAGNOSIS — N3000 Acute cystitis without hematuria: Secondary | ICD-10-CM | POA: Diagnosis not present

## 2016-11-07 DIAGNOSIS — R05 Cough: Secondary | ICD-10-CM | POA: Diagnosis not present

## 2016-11-07 DIAGNOSIS — I517 Cardiomegaly: Secondary | ICD-10-CM | POA: Diagnosis not present

## 2016-11-07 DIAGNOSIS — G0491 Myelitis, unspecified: Secondary | ICD-10-CM | POA: Diagnosis not present

## 2016-11-07 DIAGNOSIS — R0602 Shortness of breath: Secondary | ICD-10-CM | POA: Diagnosis not present

## 2016-11-07 DIAGNOSIS — E221 Hyperprolactinemia: Secondary | ICD-10-CM | POA: Diagnosis not present

## 2016-11-07 DIAGNOSIS — I480 Paroxysmal atrial fibrillation: Secondary | ICD-10-CM | POA: Diagnosis not present

## 2016-11-07 DIAGNOSIS — I350 Nonrheumatic aortic (valve) stenosis: Secondary | ICD-10-CM | POA: Diagnosis not present

## 2016-11-07 DIAGNOSIS — I77810 Thoracic aortic ectasia: Secondary | ICD-10-CM | POA: Diagnosis not present

## 2016-11-07 DIAGNOSIS — R55 Syncope and collapse: Secondary | ICD-10-CM | POA: Diagnosis not present

## 2016-11-07 DIAGNOSIS — R0609 Other forms of dyspnea: Secondary | ICD-10-CM | POA: Diagnosis not present

## 2016-11-07 DIAGNOSIS — G039 Meningitis, unspecified: Secondary | ICD-10-CM | POA: Diagnosis not present

## 2016-11-07 DIAGNOSIS — I4891 Unspecified atrial fibrillation: Secondary | ICD-10-CM | POA: Diagnosis not present

## 2016-11-07 DIAGNOSIS — R0600 Dyspnea, unspecified: Secondary | ICD-10-CM | POA: Diagnosis not present

## 2016-11-07 DIAGNOSIS — N179 Acute kidney failure, unspecified: Secondary | ICD-10-CM | POA: Diagnosis not present

## 2016-11-07 DIAGNOSIS — I10 Essential (primary) hypertension: Secondary | ICD-10-CM | POA: Diagnosis not present

## 2016-11-07 DIAGNOSIS — M5116 Intervertebral disc disorders with radiculopathy, lumbar region: Secondary | ICD-10-CM | POA: Diagnosis not present

## 2016-11-07 DIAGNOSIS — I340 Nonrheumatic mitral (valve) insufficiency: Secondary | ICD-10-CM | POA: Diagnosis not present

## 2016-11-07 DIAGNOSIS — N390 Urinary tract infection, site not specified: Secondary | ICD-10-CM | POA: Diagnosis not present

## 2016-11-07 DIAGNOSIS — T814XXA Infection following a procedure, initial encounter: Secondary | ICD-10-CM | POA: Diagnosis not present

## 2016-11-07 DIAGNOSIS — K449 Diaphragmatic hernia without obstruction or gangrene: Secondary | ICD-10-CM | POA: Diagnosis not present

## 2016-11-07 DIAGNOSIS — Z87891 Personal history of nicotine dependence: Secondary | ICD-10-CM | POA: Diagnosis not present

## 2016-11-07 DIAGNOSIS — Z79899 Other long term (current) drug therapy: Secondary | ICD-10-CM | POA: Diagnosis not present

## 2016-11-07 DIAGNOSIS — K5730 Diverticulosis of large intestine without perforation or abscess without bleeding: Secondary | ICD-10-CM | POA: Diagnosis not present

## 2016-11-07 DIAGNOSIS — I77819 Aortic ectasia, unspecified site: Secondary | ICD-10-CM | POA: Diagnosis not present

## 2016-11-10 DIAGNOSIS — I4891 Unspecified atrial fibrillation: Secondary | ICD-10-CM

## 2016-11-11 ENCOUNTER — Inpatient Hospital Stay (HOSPITAL_COMMUNITY)
Admission: AD | Admit: 2016-11-11 | Discharge: 2016-11-18 | DRG: 871 | Disposition: A | Discharge status: Home Health | Payer: Medicare Other | Source: Other Acute Inpatient Hospital | Attending: Internal Medicine | Admitting: Internal Medicine

## 2016-11-11 DIAGNOSIS — Z9841 Cataract extraction status, right eye: Secondary | ICD-10-CM

## 2016-11-11 DIAGNOSIS — G9519 Other vascular myelopathies: Secondary | ICD-10-CM | POA: Diagnosis not present

## 2016-11-11 DIAGNOSIS — Z87891 Personal history of nicotine dependence: Secondary | ICD-10-CM | POA: Diagnosis not present

## 2016-11-11 DIAGNOSIS — M545 Low back pain: Secondary | ICD-10-CM | POA: Diagnosis present

## 2016-11-11 DIAGNOSIS — T814XXA Infection following a procedure, initial encounter: Secondary | ICD-10-CM | POA: Diagnosis not present

## 2016-11-11 DIAGNOSIS — G9341 Metabolic encephalopathy: Secondary | ICD-10-CM | POA: Diagnosis present

## 2016-11-11 DIAGNOSIS — E7800 Pure hypercholesterolemia, unspecified: Secondary | ICD-10-CM | POA: Diagnosis present

## 2016-11-11 DIAGNOSIS — A419 Sepsis, unspecified organism: Principal | ICD-10-CM | POA: Diagnosis present

## 2016-11-11 DIAGNOSIS — Z96651 Presence of right artificial knee joint: Secondary | ICD-10-CM | POA: Diagnosis present

## 2016-11-11 DIAGNOSIS — B999 Unspecified infectious disease: Secondary | ICD-10-CM | POA: Diagnosis not present

## 2016-11-11 DIAGNOSIS — I4891 Unspecified atrial fibrillation: Secondary | ICD-10-CM | POA: Diagnosis not present

## 2016-11-11 DIAGNOSIS — I517 Cardiomegaly: Secondary | ICD-10-CM | POA: Diagnosis not present

## 2016-11-11 DIAGNOSIS — I5032 Chronic diastolic (congestive) heart failure: Secondary | ICD-10-CM | POA: Diagnosis not present

## 2016-11-11 DIAGNOSIS — M5116 Intervertebral disc disorders with radiculopathy, lumbar region: Secondary | ICD-10-CM | POA: Diagnosis not present

## 2016-11-11 DIAGNOSIS — E662 Morbid (severe) obesity with alveolar hypoventilation: Secondary | ICD-10-CM

## 2016-11-11 DIAGNOSIS — G0491 Myelitis, unspecified: Secondary | ICD-10-CM | POA: Diagnosis not present

## 2016-11-11 DIAGNOSIS — Z9842 Cataract extraction status, left eye: Secondary | ICD-10-CM | POA: Diagnosis not present

## 2016-11-11 DIAGNOSIS — N390 Urinary tract infection, site not specified: Secondary | ICD-10-CM | POA: Diagnosis present

## 2016-11-11 DIAGNOSIS — R0609 Other forms of dyspnea: Secondary | ICD-10-CM | POA: Diagnosis not present

## 2016-11-11 DIAGNOSIS — Z79899 Other long term (current) drug therapy: Secondary | ICD-10-CM | POA: Diagnosis not present

## 2016-11-11 DIAGNOSIS — M6008 Infective myositis, other site: Secondary | ICD-10-CM | POA: Diagnosis present

## 2016-11-11 DIAGNOSIS — G061 Intraspinal abscess and granuloma: Secondary | ICD-10-CM | POA: Diagnosis present

## 2016-11-11 DIAGNOSIS — G8929 Other chronic pain: Secondary | ICD-10-CM | POA: Diagnosis not present

## 2016-11-11 DIAGNOSIS — L02212 Cutaneous abscess of back [any part, except buttock]: Secondary | ICD-10-CM | POA: Diagnosis not present

## 2016-11-11 DIAGNOSIS — I110 Hypertensive heart disease with heart failure: Secondary | ICD-10-CM | POA: Diagnosis not present

## 2016-11-11 DIAGNOSIS — I10 Essential (primary) hypertension: Secondary | ICD-10-CM | POA: Diagnosis not present

## 2016-11-11 DIAGNOSIS — J189 Pneumonia, unspecified organism: Secondary | ICD-10-CM

## 2016-11-11 DIAGNOSIS — G039 Meningitis, unspecified: Secondary | ICD-10-CM | POA: Diagnosis present

## 2016-11-11 DIAGNOSIS — M4657 Other infective spondylopathies, lumbosacral region: Secondary | ICD-10-CM | POA: Diagnosis not present

## 2016-11-11 DIAGNOSIS — Z961 Presence of intraocular lens: Secondary | ICD-10-CM | POA: Diagnosis present

## 2016-11-11 DIAGNOSIS — J181 Lobar pneumonia, unspecified organism: Secondary | ICD-10-CM | POA: Diagnosis present

## 2016-11-11 DIAGNOSIS — M179 Osteoarthritis of knee, unspecified: Secondary | ICD-10-CM | POA: Diagnosis not present

## 2016-11-11 DIAGNOSIS — Z6833 Body mass index (BMI) 33.0-33.9, adult: Secondary | ICD-10-CM | POA: Diagnosis not present

## 2016-11-11 DIAGNOSIS — N3000 Acute cystitis without hematuria: Secondary | ICD-10-CM | POA: Diagnosis not present

## 2016-11-11 DIAGNOSIS — R0602 Shortness of breath: Secondary | ICD-10-CM | POA: Diagnosis not present

## 2016-11-11 DIAGNOSIS — E873 Alkalosis: Secondary | ICD-10-CM | POA: Diagnosis not present

## 2016-11-11 DIAGNOSIS — R938 Abnormal findings on diagnostic imaging of other specified body structures: Secondary | ICD-10-CM | POA: Diagnosis not present

## 2016-11-11 DIAGNOSIS — M4656 Other infective spondylopathies, lumbar region: Secondary | ICD-10-CM | POA: Diagnosis not present

## 2016-11-11 DIAGNOSIS — I4821 Permanent atrial fibrillation: Secondary | ICD-10-CM | POA: Diagnosis present

## 2016-11-11 DIAGNOSIS — E221 Hyperprolactinemia: Secondary | ICD-10-CM | POA: Diagnosis not present

## 2016-11-11 DIAGNOSIS — M4646 Discitis, unspecified, lumbar region: Secondary | ICD-10-CM | POA: Diagnosis present

## 2016-11-11 DIAGNOSIS — Z9889 Other specified postprocedural states: Secondary | ICD-10-CM | POA: Diagnosis not present

## 2016-11-11 DIAGNOSIS — Z452 Encounter for adjustment and management of vascular access device: Secondary | ICD-10-CM | POA: Diagnosis not present

## 2016-11-11 DIAGNOSIS — R55 Syncope and collapse: Secondary | ICD-10-CM | POA: Diagnosis not present

## 2016-11-11 DIAGNOSIS — R9389 Abnormal findings on diagnostic imaging of other specified body structures: Secondary | ICD-10-CM

## 2016-11-11 DIAGNOSIS — M1711 Unilateral primary osteoarthritis, right knee: Secondary | ICD-10-CM | POA: Diagnosis present

## 2016-11-11 DIAGNOSIS — K449 Diaphragmatic hernia without obstruction or gangrene: Secondary | ICD-10-CM | POA: Diagnosis not present

## 2016-11-11 DIAGNOSIS — I77819 Aortic ectasia, unspecified site: Secondary | ICD-10-CM | POA: Diagnosis not present

## 2016-11-11 DIAGNOSIS — R262 Difficulty in walking, not elsewhere classified: Secondary | ICD-10-CM

## 2016-11-11 DIAGNOSIS — Z96659 Presence of unspecified artificial knee joint: Secondary | ICD-10-CM | POA: Diagnosis not present

## 2016-11-11 DIAGNOSIS — L0291 Cutaneous abscess, unspecified: Secondary | ICD-10-CM

## 2016-11-11 DIAGNOSIS — N179 Acute kidney failure, unspecified: Secondary | ICD-10-CM | POA: Diagnosis not present

## 2016-11-11 DIAGNOSIS — G9340 Encephalopathy, unspecified: Secondary | ICD-10-CM | POA: Diagnosis not present

## 2016-11-11 DIAGNOSIS — M4640 Discitis, unspecified, site unspecified: Secondary | ICD-10-CM

## 2016-11-11 DIAGNOSIS — B9689 Other specified bacterial agents as the cause of diseases classified elsewhere: Secondary | ICD-10-CM | POA: Diagnosis not present

## 2016-11-11 DIAGNOSIS — I4819 Other persistent atrial fibrillation: Secondary | ICD-10-CM

## 2016-11-11 DIAGNOSIS — F0390 Unspecified dementia without behavioral disturbance: Secondary | ICD-10-CM | POA: Diagnosis present

## 2016-11-11 DIAGNOSIS — M4647 Discitis, unspecified, lumbosacral region: Secondary | ICD-10-CM | POA: Diagnosis not present

## 2016-11-11 HISTORY — DX: Other persistent atrial fibrillation: I48.19

## 2016-11-11 HISTORY — DX: Meningitis, unspecified: G03.9

## 2016-11-11 HISTORY — DX: Pneumonia, unspecified organism: J18.9

## 2016-11-11 HISTORY — DX: Sepsis, unspecified organism: A41.9

## 2016-11-11 HISTORY — DX: Morbid (severe) obesity with alveolar hypoventilation: E66.2

## 2016-11-11 HISTORY — DX: Intraspinal abscess and granuloma: G06.1

## 2016-11-11 HISTORY — DX: Urinary tract infection, site not specified: N39.0

## 2016-11-11 LAB — CBC
HCT: 32.5 % — ABNORMAL LOW (ref 39.0–52.0)
Hemoglobin: 10.2 g/dL — ABNORMAL LOW (ref 13.0–17.0)
MCH: 28.3 pg (ref 26.0–34.0)
MCHC: 31.4 g/dL (ref 30.0–36.0)
MCV: 90.3 fL (ref 78.0–100.0)
PLATELETS: 207 10*3/uL (ref 150–400)
RBC: 3.6 MIL/uL — AB (ref 4.22–5.81)
RDW: 14.3 % (ref 11.5–15.5)
WBC: 16.3 10*3/uL — AB (ref 4.0–10.5)

## 2016-11-11 LAB — CREATININE, SERUM
Creatinine, Ser: 1.12 mg/dL (ref 0.61–1.24)
GFR, EST NON AFRICAN AMERICAN: 57 mL/min — AB (ref >60)

## 2016-11-11 MED ORDER — POLYVINYL ALCOHOL 1.4 % OP SOLN: 1.0000 [drp] | Freq: Three times a day (TID) | OPHTHALMIC | Status: DC | PRN

## 2016-11-11 MED ORDER — DEXTROSE 5 % IV SOLN
5.0000 mg/h | INTRAVENOUS | Status: AC
  Administered 2016-11-11 – 2016-11-13 (×7): 15 mg/h via INTRAVENOUS
  Filled 2016-11-11 (×7): qty 100

## 2016-11-11 MED ORDER — FUROSEMIDE 20 MG PO TABS
20.0000 mg | ORAL_TABLET | Freq: Two times a day (BID) | ORAL | Status: DC
  Administered 2016-11-11 – 2016-11-13 (×4): 20 mg via ORAL
  Filled 2016-11-11 (×4): qty 1

## 2016-11-11 MED ORDER — TRAMADOL HCL 50 MG PO TABS
50.0000 mg | ORAL_TABLET | Freq: Four times a day (QID) | ORAL | Status: DC
Start: 2016-11-11 — End: 2016-11-18
  Administered 2016-11-11 – 2016-11-17 (×7): 50 mg via ORAL
  Filled 2016-11-11 (×11): qty 1

## 2016-11-11 MED ORDER — ENOXAPARIN SODIUM 60 MG/0.6ML ~~LOC~~ SOLN
50.0000 mg | SUBCUTANEOUS | Status: DC
  Administered 2016-11-11: 50 mg via SUBCUTANEOUS
  Filled 2016-11-11: qty 0.6

## 2016-11-11 MED ORDER — DEXTROSE 5 % IV SOLN
850.0000 mg | Freq: Three times a day (TID) | INTRAVENOUS | Status: DC
  Administered 2016-11-11 – 2016-11-12 (×2): 850 mg via INTRAVENOUS
  Filled 2016-11-11 (×3): qty 17

## 2016-11-11 MED ORDER — ENOXAPARIN SODIUM 40 MG/0.4ML ~~LOC~~ SOLN: 40.0000 mg | SUBCUTANEOUS | Status: DC

## 2016-11-11 MED ORDER — PRAVASTATIN SODIUM 40 MG PO TABS
40.0000 mg | ORAL_TABLET | Freq: Every day | ORAL | Status: DC
  Administered 2016-11-11 – 2016-11-17 (×7): 40 mg via ORAL
  Filled 2016-11-11 (×7): qty 1

## 2016-11-11 MED ORDER — DEXTROSE 5 % IV SOLN
2.0000 g | Freq: Two times a day (BID) | INTRAVENOUS | Status: DC
  Administered 2016-11-11 – 2016-11-18 (×14): 2 g via INTRAVENOUS
  Filled 2016-11-11 (×18): qty 2

## 2016-11-11 MED ORDER — SODIUM CHLORIDE 0.9 % IV SOLN
2.0000 g | INTRAVENOUS | Status: DC
  Administered 2016-11-12 – 2016-11-15 (×20): 2 g via INTRAVENOUS
  Filled 2016-11-11 (×24): qty 2000

## 2016-11-11 MED ORDER — METOPROLOL TARTRATE 12.5 MG HALF TABLET
12.5000 mg | ORAL_TABLET | Freq: Two times a day (BID) | ORAL | Status: DC
  Administered 2016-11-11 – 2016-11-13 (×5): 12.5 mg via ORAL
  Filled 2016-11-11 (×7): qty 1

## 2016-11-11 MED ORDER — SODIUM CHLORIDE 0.9 % IV SOLN
2.0000 g | Freq: Four times a day (QID) | INTRAVENOUS | Status: AC
  Filled 2016-11-11 (×3): qty 2000

## 2016-11-11 MED ORDER — SODIUM CHLORIDE 0.9 % IV SOLN
1250.0000 mg | INTRAVENOUS | Status: DC
  Administered 2016-11-12 – 2016-11-17 (×6): 1250 mg via INTRAVENOUS
  Filled 2016-11-11 (×7): qty 1250

## 2016-11-11 NOTE — Progress Notes (Signed)
Pt arrived from [facility] with cardizem drip running at 15. Pt does not have orders for cardizem drip. MD paged x2 for orders. No new orders at this time. Report has been given to night shift RN. Pt's RN and charge RN have been updated on this information.   Berdine DanceLauren Moffitt BSN, RN

## 2016-11-11 NOTE — H&P (Signed)
History and Physical    Andrew Salas YQM:[id number] DOB: 12-07-1925 DOA: 11/11/2016  PCP: Paulina FusiSCHULTZ,DOUGLAS E, MD  Patient coming from: [facility]  Chief Complaint: Encephalopathy and continuation of care  HPI:   82 year old male Known history of Hypertension Chronic low back pain Hypercholesterolemia Prior right knee arthroplasty  He underwent lumbosacral transforaminal epidural steroid injection 11/04/16 with lidocaine and Depo Medrol 80 on 11/04/2016. Dr. Ronne BinningFrederick Newton [facility]  patient [facility] for acute encephalopathy. Symptoms are waxing and waning.  Also admitted for shortness of breath and dyspneic He has underlying mild mental status He had some mild pyuria with symptomatology associated with urinary tract infection and was started on empiric coverage  His white count on admission looks like it was 17 on admit and the sedimentation rate was 37 Bun/creat 27/1.3  He seemed to improve somewhat on ciprofloxacin orally without urinary cultures  elevated sedimentation rate results of elevated and chest x-ray CT scan and chest were negative for occult infection CT scan of abdomen pelvis did not show any acute abnormality and patient flipped into A. fib with RVR and was placed on Cardizem  No urine cultures were not taken at that time. Blood cultures negative x 2 12/24  Echo performed at admission showed mild concentric left ventricular hypertrophy decreased LVEF and rapid A. fib aortic sclerosis normal RV  Obtained LP on 11/10/2016 with CSF cultures pending and HSV studies pending. CSF with protein 261, with an elevated neutrophil count and glucose of 67-CSF wbc count 199 CSF RBC 357. Protein 261 spinal fluid glucose 267 cultures dated 12/24 2017 . Concern for possible bacterial meningitis.  MRI lumbar spine and back [facility].     ED Course:   Review of Systems: Denies  nausea denies vomiting denies chest pain denies dark stool denies tarry stool No unilateral weakness wife reports intermittent confusion going on for the past year or so No cough but does cough occasionally after eating No nausea No vomiting No recent falls     Past Medical History:  Diagnosis Date  . Arthritis    R knee  . Hyperlipidemia   . Hypertension    Dr Dulce [place]unley in [place] is pt's cardiologist.  . Ulcer (HCC)    stomach 60 years ago- no current problem    Past Surgical History:  Procedure Laterality Date  . CARDIAC CATHETERIZATION  6 yrs ago   [facility]  . EYE SURGERY  12/2010   Cataract bil with lens implant, please be careful if washing  eyes  . HARDWARE REMOVAL  12/25/2011   Procedure: HARDWARE REMOVAL;  Surgeon: Eldred MangesMark C Yates, MD;  [facility];  Service: Orthopedics;  Laterality: Right;   Hardware Removal Right Femur  . HERNIA REPAIR     per chest X- Ray  . KNEE ARTHROPLASTY  02/03/2012   Procedure: COMPUTER ASSISTED TOTAL KNEE ARTHROPLASTY;  Surgeon: Eldred MangesMark C Yates, MD;  Location: [facility] OR;  Service: Orthopedics;  Laterality: Right;  Right Total Knee Arthroplasty, Cemented  . KNEE ARTHROSCOPY     bilateral  . KNEE ARTHROSCOPY  1989   Right  . KNEE RECONSTRUCTION, MEDIAL PATELLAR FEMORAL LIGAMENT     "screws and pins inserted" from a fracture  . PROSTATE SURGERY     years ago "the Dr microwaved it"  . REPLACEMENT TOTAL KNEE       reports that he has quit smoking. He quit after 6.00  years of use. He quit smokeless tobacco use about 66 years ago. He reports that he drinks about 3.6 oz of alcohol per week . He reports that he does not use drugs.  No Known Allergies  Family History  Problem Relation Age of Onset  . Anesthesia problems Neg Hx    Acceptable: Family history reviewed and not pertinent  Prior to Admission medications   Medication Sig Start Date End Date Taking? Authorizing Provider  benazepril (LOTENSIN) 40 MG tablet Take 40 mg by mouth  daily.   Yes Historical Provider, MD  latanoprost (XALATAN) 0.005 % ophthalmic solution Place 1 drop into both eyes at bedtime.   Yes Historical Provider, MD  Multiple Vitamins-Minerals (PRESERVISION AREDS 2 PO) Take 1 tablet by mouth 2 (two) times daily.   Yes Historical Provider, MD  pravastatin (PRAVACHOL) 40 MG tablet Take 40 mg by mouth daily.   Yes Historical Provider, MD  traMADol (ULTRAM) 50 MG tablet Take 50 mg by mouth 3 (three) times daily as needed for pain. 11/06/16  Yes Historical Provider, MD  amLODipine (NORVASC) 10 MG tablet Take 10 mg by mouth daily.    Historical Provider, MD  carboxymethylcellulose (REFRESH PLUS) 0.5 % SOLN 1 drop 3 (three) times daily as needed. For dry eyes    Historical Provider, MD  hydrochlorothiazide (HYDRODIURIL) 25 MG tablet Take 25 mg by mouth daily.    Historical Provider, MD  oxyCODONE-acetaminophen (PERCOCET) 5-325 MG per tablet Take 1 tablet by mouth every 4 (four) hours as needed.    Historical Provider, MD    Physical Exam: Vitals:   11/11/16 1747  BP: (!) 118/57  Pulse: 95  Resp: 18  Temp: 98.3 F (36.8 C)  TempSrc: Oral  SpO2: 100%  Weight: 104.9 kg (231 lb 4.8 oz)  Height: 5\' 10"  (1.778 m)      Constitutional: NAD, calm, comfortable Vitals:   11/11/16 1747  BP: (!) 118/57  Pulse: 95  Resp: 18  Temp: 98.3 F (36.8 C)  TempSrc: Oral  SpO2: 100%  Weight: 104.9 kg (231 lb 4.8 oz)  Height: 5\' 10"  (1.778 m)   Eyes: PERRL, lids and conjunctivae normal, thick neck, mallampati 3 ENMT: Mucous membranes are moist. Posterior pharynx clear of any exudate or lesions.Normal dentition.  Neck: normal, supple, no masses, no thyromegaly Respiratory: clear to auscultation bilaterally, no wheezing, no crackles. Normal respiratory effort. No accessory muscle use. No other sounds Cardiovascular: Regular rate and rhythm, no murmurs / rubs / gallops. Trace extremity edema. 2+ pedal pulses. No carotid bruits.  Abdomen: no tenderness, no masses  palpated. No hepatosplenomegaly. Bowel sounds positive.  Musculoskeletal: no clubbing / cyanosis. No joint deformity upper and lower extremities. Good ROM, but does have limitation of left arm movement and elevation because of a frozen shoulder no contractures. Normal muscle tone.  Skin: no rashes, lesions, ulcers. No induration Neurologic: CN 2-12 grossly intact. Sensation intact, DTR normal. Strength 5/5 in all 4.  Psychiatric: Normal judgment and insight. Alert and oriented x 2 and at times gets confused however is able to tell me the Pres., and can tell me this season and with some encouragement can tell me other orienting factors   Labs on Admission: I have personally reviewed following labs and imaging studies  CBC: No results for input(s): WBC, NEUTROABS, HGB, HCT, MCV, PLT in the last 168 hours. Basic Metabolic Panel: No results for input(s): NA, K, CL, CO2, GLUCOSE, BUN, CREATININE, CALCIUM, MG, PHOS in the last 168 hours. GFR:  CrCl cannot be calculated (Patient's most recent lab result is older than the maximum 21 days allowed.). Liver Function Tests: No results for input(s): AST, ALT, ALKPHOS, BILITOT, PROT, ALBUMIN in the last 168 hours. No results for input(s): LIPASE, AMYLASE in the last 168 hours. No results for input(s): AMMONIA in the last 168 hours. Coagulation Profile: No results for input(s): INR, PROTIME in the last 168 hours. Cardiac Enzymes: No results for input(s): CKTOTAL, CKMB, CKMBINDEX, TROPONINI in the last 168 hours. BNP (last 3 results) No results for input(s): PROBNP in the last 8760 hours. HbA1C: No results for input(s): HGBA1C in the last 72 hours. CBG: No results for input(s): GLUCAP in the last 168 hours. Lipid Profile: No results for input(s): CHOL, HDL, LDLCALC, TRIG, CHOLHDL, LDLDIRECT in the last 72 hours. Thyroid Function Tests: No results for input(s): TSH, T4TOTAL, FREET4, T3FREE, THYROIDAB in the last 72 hours. Anemia Panel: No results for  input(s): VITAMINB12, FOLATE, FERRITIN, TIBC, IRON, RETICCTPCT in the last 72 hours. Urine analysis:    Component Value Date/Time   COLORURINE AMBER (A) 01/26/2012 1508   APPEARANCEUR CLOUDY (A) 01/26/2012 1508   LABSPEC 1.022 01/26/2012 1508   PHURINE 5.0 01/26/2012 1508   GLUCOSEU NEGATIVE 01/26/2012 1508   HGBUR NEGATIVE 01/26/2012 1508   BILIRUBINUR NEGATIVE 01/26/2012 1508   KETONESUR 15 (A) 01/26/2012 1508   PROTEINUR NEGATIVE 01/26/2012 1508   UROBILINOGEN 1.0 01/26/2012 1508   NITRITE NEGATIVE 01/26/2012 1508   LEUKOCYTESUR LARGE (A) 01/26/2012 1508     Radiological Exams on Admission: No results found.  EKG: Independently reviewed. none  Assessment/Plan Principal Problem:   Infection, possible disciitis vs meningitis Infectious disease has been consulted and they will see in consult Oh he has hardware unlikely any knee issue given his non-swelling and noninfected appearance of the knee Continue empiric meningitis doses of vancomycin and Rocephin and use coverage for HSV as well Differential diagnosis could be a discitis and we will order MRI of the spine to rule this out as he at bedtime had instrumentation Repeat CBC plus differential and Active Problems:   A-fib [facility]), [facility] 12[facility]25, CHADS-VASc score >3 Patient might Benefit from systemic anticoagulation we will keep him on the monitor as an monitor him for recurrence of A. fib I will place his benazepril with metoprolol 12.5 twice a day. I'm unable to determine what medications he was getting at [facility] placed on the discharge summary and we will continue to monitor   Obesity hypoventilation syndrome (HCC), presumed He had a blood gas showing PCO2 of 30 and he was a little bit alkalotic We will monitor and get desaturation screening Probable acute decompensated heart failure We will hold his benazepril as above and get a chest x-ray in the morning. I would give him low-dose Lasix 20 twice a day  for now we do not have labs to guide our therapy   UTI (urinary tract infection) He was recently treated for a urinary infection and we are treating empirically for meningitis/discitis as above no further treatment. He has a Foley catheter that was placed at [place] prior to transfer as he had some inability to void and we will need to bladder training Mild to moderate dementia We'll need reorienting and follow-up as an outpatient    Rhetta Mura MD [facility] Pager [phone]  If 7PM-7AM, please contact night-coverage [email or web address] Password [facility]  11/11/2016, 6:46 PM

## 2016-11-11 NOTE — Progress Notes (Addendum)
Pharmacy Antibiotic Note  Andrew Salas is a 82 y.o. male admitted on 11/11/2016 with acute encephalopathy.  Patient has a history of lumbosacral transforaminal epidural steroid injection on 11/04/16.  He was started on vancomycin, ceftriaxone, and acyclovir for r/o meningitis and discitis, and then transferred [facility] on [facility].  Pharmacy consulted to continue previous antiviral/antibiotics and add ampicillin.  SCr [place] [facility] is 1.1, estimated CrCL 45-65 ml/min.   Plan: - Change vanc to 1250mg  IV Q24H, start tomorrow  - CTX 2gm IV Q12H, start now - Ampicillin 2gm IV Q4H, start now - Change acyclovir to 850mg  IV Q8H (using AdjBW for obesity), start now - Monitor renal fxn, clinical progress, vanc trough as indicated - Increase Lovenox to 50mg  SQ Q24H for BMI = 33 - [facility]?   Height: 5\' 10"  (177.8 cm) Weight: 231 lb 4.8 oz (104.9 kg) IBW/kg (Calculated) : 73  AdjBW = 86 kg  Temp (24hrs), Avg:98.3 F (36.8 C), Min:98.3 F (36.8 C), Max:98.3 F (36.8 C)  No results for input(s): WBC, CREATININE, LATICACIDVEN, VANCOTROUGH, VANCOPEAK, VANCORANDOM, GENTTROUGH, GENTPEAK, GENTRANDOM, TOBRATROUGH, TOBRAPEAK, TOBRARND, AMIKACINPEAK, AMIKACINTROU, AMIKACIN in the last 168 hours.  CrCl cannot be calculated (Patient's most recent lab result is older than the maximum 21 days allowed.).    No Known Allergies  Antimicrobials this admission:  Vanc 12/26 PTA >> CTX 12/26 PTA  >> Acyclovir 12/26 PTA >> Ampicillin 12/27 >> Cipro PTA  Dose adjustments this admission:  N/A  Microbiology results:  12/24 BCx x2 [facility]([place]) - NGTD 12/26 CSF [facility] Salvia([place]) - NGTD   Andrew Salas, PharmD, BCPS Pager:  [phone] 11/11/2016, 8:20 PM

## 2016-11-11 NOTE — Progress Notes (Signed)
[facility]. Pt oriented to room. Telemetry box applied and CCMD notified. Pt is on cardizem drip at 15. MD paged x2. No orders for pt. Will continue pt's current plan of care.   Berdine DanceLauren Moffitt BSN, RN

## 2016-11-12 ENCOUNTER — Inpatient Hospital Stay (HOSPITAL_COMMUNITY): Payer: Medicare Other

## 2016-11-12 DIAGNOSIS — G061 Intraspinal abscess and granuloma: Secondary | ICD-10-CM

## 2016-11-12 DIAGNOSIS — B9689 Other specified bacterial agents as the cause of diseases classified elsewhere: Secondary | ICD-10-CM

## 2016-11-12 DIAGNOSIS — M4657 Other infective spondylopathies, lumbosacral region: Secondary | ICD-10-CM

## 2016-11-12 DIAGNOSIS — I4891 Unspecified atrial fibrillation: Secondary | ICD-10-CM

## 2016-11-12 DIAGNOSIS — M4656 Other infective spondylopathies, lumbar region: Secondary | ICD-10-CM

## 2016-11-12 LAB — CBC
HEMATOCRIT: 32.7 % — AB (ref 39.0–52.0)
HEMATOCRIT: 32.2 % — AB (ref 39.0–52.0)
HEMOGLOBIN: 10.3 g/dL — AB (ref 13.0–17.0)
Hemoglobin: 10.1 g/dL — ABNORMAL LOW (ref 13.0–17.0)
MCH: 28.5 pg (ref 26.0–34.0)
MCH: 28.2 pg (ref 26.0–34.0)
MCHC: 31.5 g/dL (ref 30.0–36.0)
MCHC: 31.4 g/dL (ref 30.0–36.0)
MCV: 90.3 fL (ref 78.0–100.0)
MCV: 89.9 fL (ref 78.0–100.0)
Platelets: 209 10*3/uL (ref 150–400)
Platelets: 234 10*3/uL (ref 150–400)
RBC: 3.62 MIL/uL — AB (ref 4.22–5.81)
RBC: 3.58 MIL/uL — ABNORMAL LOW (ref 4.22–5.81)
RDW: 14.3 % (ref 11.5–15.5)
RDW: 14.2 % (ref 11.5–15.5)
WBC: 16.4 10*3/uL — AB (ref 4.0–10.5)
WBC: 16 10*3/uL — AB (ref 4.0–10.5)

## 2016-11-12 LAB — COMPREHENSIVE METABOLIC PANEL
ALT: 32 U/L (ref 17–63)
ANION GAP: 9 (ref 5–15)
AST: 27 U/L (ref 15–41)
Albumin: 2.4 g/dL — ABNORMAL LOW (ref 3.5–5.0)
Alkaline Phosphatase: 73 U/L (ref 38–126)
BUN: 19 mg/dL (ref 6–20)
CO2: 26 mmol/L (ref 22–32)
Calcium: 7.9 mg/dL — ABNORMAL LOW (ref 8.9–10.3)
Chloride: 99 mmol/L — ABNORMAL LOW (ref 101–111)
Creatinine, Ser: 1.17 mg/dL (ref 0.61–1.24)
GFR, EST NON AFRICAN AMERICAN: 54 mL/min — AB (ref >60)
Glucose, Bld: 135 mg/dL — ABNORMAL HIGH (ref 65–99)
POTASSIUM: 3.8 mmol/L (ref 3.5–5.1)
Sodium: 134 mmol/L — ABNORMAL LOW (ref 135–145)
Total Bilirubin: 0.4 mg/dL (ref 0.3–1.2)
Total Protein: 5.4 g/dL — ABNORMAL LOW (ref 6.5–8.1)

## 2016-11-12 LAB — PROTIME-INR
INR: 1.08
Prothrombin Time: 14 seconds (ref 11.4–15.2)

## 2016-11-12 MED ORDER — HEPARIN (PORCINE) IN NACL 100-0.45 UNIT/ML-% IJ SOLN
1700.0000 [IU]/h | INTRAMUSCULAR | Status: AC
  Administered 2016-11-12: 1300 [IU]/h via INTRAVENOUS
  Filled 2016-11-12: qty 250

## 2016-11-12 MED ORDER — GADOBENATE DIMEGLUMINE 529 MG/ML IV SOLN
20.0000 mL | Freq: Once | INTRAVENOUS | Status: AC | PRN
  Administered 2016-11-12: 20 mL via INTRAVENOUS

## 2016-11-12 MED ORDER — LORATADINE 10 MG PO TABS
10.0000 mg | ORAL_TABLET | Freq: Every day | ORAL | Status: DC
  Administered 2016-11-13 – 2016-11-18 (×7): 10 mg via ORAL
  Filled 2016-11-12 (×8): qty 1

## 2016-11-12 NOTE — Progress Notes (Signed)
Ampicillin iv scheduled for 1300 today was skipped because patient got the dose scheduled for 0900 late as patient was away from the unit for an MRI.  Will continue to monitor

## 2016-11-12 NOTE — Progress Notes (Signed)
PROGRESS NOTE    Andrew Salas  ZOX:[id number] DOB: 1925-12-19 DOA: 11/11/2016 PCP: Paulina FusiSCHULTZ,DOUGLAS E, MD    Brief Narrative:  82 year old male Known history of Hypertension Chronic low back pain Hypercholesterolemia Prior right knee arthroplasty  He underwent lumbosacral transforaminal epidural steroid injection 11/04/16 with lidocaine and Depo Medrol 80 on 11/04/2016. Dr. Ronne BinningFrederick Newton of [facility]  patient admitted on 11/07/2016 [facility] for acute encephalopathy. Symptoms are waxing and waning.  Also admitted for shortness of breath and dyspneic  Ultimately had LP which was concerning for Meningitis [facility] showed multi-focal abcesses and erector muscle abcesses  IR and ID consulted-input appreciated   Assessment & Plan:   Principal Problem:   Infection, possible disciitis vs meningitis Active Problems:   A-fib [facility](HCC), [facility] onset [facility] 12/25   Obesity hypoventilation syndrome (HCC), presumed   UTI (urinary tract infection)   Sepsis (HCC)   Epidural and paraspinal abceses -IR to obtain sample of representative collection for culture -cont Vanc/ceftriaxone--could probably d/c ampicillin, defer to ID.  Have d/c Acyclovir  New onset Afib-CHad2Vasc2 score=3 -start heparin gtt as AC for now -rate control with Cardizem GTT, Metoprolol 12.5 bid -will convert Cardizem to PO once procedure done  Probable AECHF Echo from [facility] showed some diastolic HF chest x-ray 12/28=Pulm venous congestion Continue low-dose Lasix 20 twice a day for now bmet and Mag in am   Lumbago Will alert his orthopedic practice about issues above Might need alternative pain management going forward  Foley Likely can d/c this once procedure done  [facility] Need sOP testing with pulmonology  Metabolic encephalopathy Has imporved somewhat   DVT prophylaxis: Heparin/anticoagulated Code Status: Full Family Communication: discussed  with Wife and family Disposition Plan:  inpatient   Consultants:   IR  ID  Procedures:   None yet  Antimicrobials:   vanc   Ampicillin  Acyclovir  ceftriaxone    Subjective:  Better More oriented  In nad currently Eating drinking Seems more oriented  Objective: Vitals:   11/12/16 0544 11/12/16 0842 11/12/16 1241 11/12/16 1350  BP: (!) 150/73 139/68 133/63 123/84  Pulse: (!) 104 (!) 102 77 95  Resp: (!) 22 (!) 22  (!) 24  Temp: 98 F (36.7 C) 97.7 F (36.5 C) 98.7 F (37.1 C) 98.8 F (37.1 C)  TempSrc: Oral Oral Oral Axillary  SpO2: 97% 98%  98%  Weight:      Height:        Intake/Output Summary (Last 24 hours) at 11/12/16 1605 Last data filed at 11/12/16 1420  Gross per 24 hour  Intake              720 ml  Output             2750 ml  Net            -2030 ml   Filed Weights   11/11/16 1747  Weight: 104.9 kg (231 lb 4.8 oz)    Examination:  General exam: calm and comfortable  Respiratory system: Clear to auscultation. Respiratory effort normal. Cardiovascular system: S1 & S2 heard, RRR. No JVD, murmurs, rubs, gallops or clicks. No pedal edema. Gastrointestinal system: Abdomen is soft nt nd Central nervous system: Alert and oriented. No focal neurological deficits. Extremities: Symmetric 5 x 5 power. Skin: No rashes, lesions or ulcers Psychiatry: Judgement and insight appear normal. Mood & affect appropriate.     Data Reviewed: I have personally reviewed following labs and imaging  studies  CBC:  Recent Labs Lab 11/11/16 1951 11/12/16 0201  WBC 16.3* 16.4*  HGB 10.2* 10.3*  HCT 32.5* 32.7*  MCV 90.3 90.3  PLT 207 209   Basic Metabolic Panel:  Recent Labs Lab 11/11/16 1951 11/12/16 0201  NA  --  134*  K  --  3.8  CL  --  99*  CO2  --  26  GLUCOSE  --  135*  BUN  --  19  CREATININE 1.12 1.17  CALCIUM  --  7.9*   GFR: Estimated Creatinine Clearance: 53 mL/min (by C-G formula based on SCr of 1.17 mg/dL). Liver Function  Tests:  Recent Labs Lab 11/12/16 0201  AST 27  ALT 32  ALKPHOS 73  BILITOT 0.4  PROT 5.4*  ALBUMIN 2.4*   No results for input(s): LIPASE, AMYLASE in the last 168 hours. No results for input(s): AMMONIA in the last 168 hours. Coagulation Profile: No results for input(s): INR, PROTIME in the last 168 hours. Cardiac Enzymes: No results for input(s): CKTOTAL, CKMB, CKMBINDEX, TROPONINI in the last 168 hours. BNP (last 3 results) No results for input(s): PROBNP in the last 8760 hours. HbA1C: No results for input(s): HGBA1C in the last 72 hours. CBG: No results for input(s): GLUCAP in the last 168 hours. Lipid Profile: No results for input(s): CHOL, HDL, LDLCALC, TRIG, CHOLHDL, LDLDIRECT in the last 72 hours. Thyroid Function Tests: No results for input(s): TSH, T4TOTAL, FREET4, T3FREE, THYROIDAB in the last 72 hours. Anemia Panel: No results for input(s): VITAMINB12, FOLATE, FERRITIN, TIBC, IRON, RETICCTPCT in the last 72 hours. Sepsis Labs: No results for input(s): PROCALCITON, LATICACIDVEN in the last 168 hours.  No results found for this or any previous visit (from the past 240 hour(s)).       Radiology Studies: Dg Chest 2 View  Result Date: 11/12/2016 CLINICAL DATA:  Discitis. EXAM: CHEST  2 VIEW COMPARISON:  11/06/2016 FINDINGS: Cardiomegaly and large hiatal hernia. Pulmonary vascular congestion and hazy basilar opacity that is likely atelectasis. Posttraumatic deformity of the left chest wall. Chronic upper mediastinal widening from ectatic vasculature based on recent chest CT. IMPRESSION: Cardiomegaly and pulmonary venous congestion. Intrathoracic stomach. Electronically Signed   By: Marnee Spring M.D.   On: 11/12/2016 08:02   Mr Lumbar Spine W Wo Contrast  Addendum Date: 11/12/2016   ADDENDUM REPORT: 11/12/2016 13:03 ADDENDUM: Study discussed by telephone with Dr. Rhetta Mura on 11/12/2016 At 1244 hours. Electronically Signed   By: Odessa Fleming M.D.   On:  11/12/2016 13:03   Result Date: 11/12/2016 CLINICAL DATA:  82 year old male who underwent lumbar epidural steroid injection on 11/04/2016. Admitted to [facility] for acute encephalopathy. Lumbar puncture at [facility] on 11/10/2016 revealing cloudy CSF with debris suspicious for meningitis. Possible sepsis. Initial encounter. EXAM: MRI LUMBAR SPINE WITHOUT AND WITH CONTRAST TECHNIQUE: Multiplanar and multiecho pulse sequences of the lumbar spine were obtained without and with intravenous contrast. CONTRAST:  20mL MULTIHANCE GADOBENATE DIMEGLUMINE 529 MG/ML IV SOLN COMPARISON:  [facility] Lumbar puncture images 11/10/16, CT Abdomen and Pelvis 11/08/2016. Lumbar MRI 07/23/2015 FINDINGS: Segmentation: Normal, which is the same numbering system on the 2016 MRI. Alignment: Stable since 2016 with grade 1 anterolisthesis of L4 on L5 and mildly exaggerated lumbar lordosis. Vertebrae: Mild chronic anterior endplate edema at L2 appears degenerative in nature. There is abnormal increased fluid in the bilateral L5-S1 and the right L4-L5 facets, see below. There is no definite posterior element marrow edema. Visible sacrum appears intact. Visible SI  joints are within normal limits. Conus medullaris: Extends to the L1 level. There is no abnormal signal in the visible lower thoracic spinal cord or conus. See epidural abnormalities described below. No abnormal intradural enhancement identified. Paraspinal and other soft tissues: Negative visualized abdominal viscera. Diffusely abnormal epidural space in the lumbar spine from the superior L3 to the sacral level. Elongated posterior epidural fluid collection tracking from L4-L5 into the sacrum encompassing 11 x 15 x 75 mm (AP by transverse by CC, for an estimated volume of 6-7 mL). See series 9, image 7, series 3, image 7, series 8, image 29 and series 7, image 35. Mild associated mass effect on the posterior thecal sac. Associated abnormal posterior dural thickening  and heterogeneous epidural space enhancement, maximal at L4 (series 9, image 7). There is also a superimposed smaller ventral epidural abscess primarily to the left of midline at L3 and L4 best seen on series 3, image 7 and series 7 image 21. It is possible there is a superimposed subdural fluid collection tracking as high as the tip of the conus (series 7, image 1) but this is not rim enhancing. There are posterior erector spinae muscle abscesses which are small and clustered and present more so to the right of midline. These are best demonstrated on series 6 images 3 through 6. The cluster of right side muscle abscess encompasses 24 x 22 x 69 mm (AP by transverse by CC, for an estimated volume of 18 mL). See series 9, image 2. This is in association with new right side L4-L5, L5-S1, and left side L5-S1 facet joint fluid suspicious. There is superimposed diffuse lumbar subcutaneous edema. Disc levels: The above epidural abnormality exacerbates multifactorial degenerative lumbar spinal stenosis from L3-L4 to L5-S1. Heterogeneous signal in the L1-L2 disc is chronic and appears degenerative in nature. IMPRESSION: 1. Positive for multifocal Lumbar Epidural and Paraspinal Abscesses from the L3 to the S2 level. - Ventral and dorsal epidural abscesses, most pronounced from L4 to S2. Associated lumbar dural thickening and enhancement. - Right greater than left erector spinae muscle abscesses, encompassing 2 x 2 x 7 cm on the right from the L4 to the S1 level. 2. Suspect associated Septic Facet Joints on the right at L4-L5 and L5-S1, and on the left at L5-S1. 3. No overt lumbosacral osteomyelitis and no discitis suspected at this time. Electronically Signed: By: Odessa FlemingH  Hall M.D. On: 11/12/2016 12:40        Scheduled Meds: . ampicillin (OMNIPEN) IV  2 g Intravenous Q4H  . cefTRIAXone (ROCEPHIN)  IV  2 g Intravenous Q12H  . enoxaparin (LOVENOX) injection  50 mg Subcutaneous Q24H  . furosemide  20 mg Oral BID  .  metoprolol tartrate  12.5 mg Oral BID  . pravastatin  40 mg Oral Daily  . traMADol  50 mg Oral Q6H  . vancomycin  1,250 mg Intravenous Q24H   Continuous Infusions: . diltiazem (CARDIZEM) infusion 15 mg/hr (11/12/16 1051)     LOS: 1 day    Time spent: 6135    Rhetta MuraSAMTANI, JAI-GURMUKH, MD [facility] Pager [phone]  If 7PM-7AM, please contact night-coverage [email or web address] Password [email or web address] 11/12/2016, 4:05 PM

## 2016-11-12 NOTE — Consult Note (Signed)
[facility]  Date of Admission:  11/11/2016  Date of Consult:  11/12/2016  Reason for Consult: epidural abscess Referring Physician: Burgess Estelle  Impression/Recommendation Epidural, paraspinal abscess Septic Facet Joints  Would continue his current anbx He seems to have not only an infection in his paraspinal and epidural spaces but also his CSF.  Would consider neurosurgery eval IR eval for aspirate.  Check BCx  New afib On beta-blocker; heparin, cardizem drip.   Thank you so much for this interesting consult,   Bobby Rumpf (pager) [phone] [email or web address]  Andrew Salas is an 82 y.o. male.  HPI: 82 yo M with hx of chronic low back pain, epidural steroid/lidocaine injection on 11-04-16. Since that time he has become confused, dyspneic, and SOB. He was evaluated at [facility] and his wife believes they were told he had a UTI. His urine was brown at that time. He was found on LP to have Glc 67, Prot 261, WBC 179.5 (90% N). G/S many WBC, no bacteria. Cx is ngtd.  He was sent to [facility] from [place] and had MRI showing: 1. Positive for multifocal Lumbar Epidural and Paraspinal Abscesses from the L3 to the S2 level. - Ventral and dorsal epidural abscesses, most pronounced from L4 to S2. Associated lumbar dural thickening and enhancement. - Right greater than left erector spinae muscle abscesses, encompassing 2 x 2 x 7 cm on the right from the L4 to the S1 level. 2. Suspect associated Septic Facet Joints on the right at L4-L5 and L5-S1, and on the left at L5-S1. 3. No overt lumbosacral osteomyelitis and no discitis suspected at this time.  Past Medical History:  Diagnosis Date  . Arthritis    R knee  . Hyperlipidemia   . Hypertension    Dr Bettina Gavia in [place] is pt's cardiologist.  . Ulcer ([place])    stomach 60 years ago- no current problem    Past Surgical History:  Procedure Laterality Date  . CARDIAC  CATHETERIZATION  6 yrs ago   [facility]  . EYE SURGERY  12/2010   Cataract bil with lens implant, please be careful if washing  eyes  . HARDWARE REMOVAL  12/25/2011   Procedure: HARDWARE REMOVAL;  Surgeon: Marybelle Killings, MD;  Location: [facility];  Service: Orthopedics;  Laterality: Right;   Hardware Removal Right Femur  . HERNIA REPAIR     per chest X- Ray  . KNEE ARTHROPLASTY  02/03/2012   Procedure: COMPUTER ASSISTED TOTAL KNEE ARTHROPLASTY;  Surgeon: Marybelle Killings, MD;  Location: [facility];  Service: Orthopedics;  Laterality: Right;  Right Total Knee Arthroplasty, Cemented  . KNEE ARTHROSCOPY     bilateral  . KNEE ARTHROSCOPY  1989   Right  . KNEE RECONSTRUCTION, MEDIAL PATELLAR FEMORAL LIGAMENT     "screws and pins inserted" from a fracture  . PROSTATE SURGERY     years ago "the Dr microwaved it"  . REPLACEMENT TOTAL KNEE       No Known Allergies  Medications:  Scheduled: . ampicillin (OMNIPEN) IV  2 g Intravenous Q4H  . cefTRIAXone (ROCEPHIN)  IV  2 g Intravenous Q12H  . enoxaparin (LOVENOX) injection  50 mg Subcutaneous Q24H  . furosemide  20 mg Oral BID  . metoprolol tartrate  12.5 mg Oral BID  . pravastatin  40 mg Oral Daily  . traMADol  50 mg Oral Q6H  . vancomycin  1,250 mg Intravenous Q24H    Abtx:  Anti-infectives    Start     Dose/Rate Route Frequency Ordered Stop   11/12/16 1800  vancomycin (VANCOCIN) 1,250 mg in sodium chloride 0.9 % 250 mL IVPB     1,250 mg 166.7 mL/hr over 90 Minutes Intravenous Every 24 hours 11/11/16 2008     11/12/16 0100  ampicillin (OMNIPEN) 2 g in sodium chloride 0.9 % 50 mL IVPB     2 g 150 mL/hr over 20 Minutes Intravenous Every 4 hours 11/11/16 2024     11/11/16 2100  acyclovir (ZOVIRAX) 850 mg in dextrose 5 % 150 mL IVPB  Status:  Discontinued     850 mg 167 mL/hr over 60 Minutes Intravenous Every 8 hours 11/11/16 2019 11/12/16 1249   11/11/16 2000  cefTRIAXone (ROCEPHIN) 2 g in dextrose 5 % 50 mL IVPB     2 g 100 mL/hr over 30  Minutes Intravenous Every 12 hours 11/11/16 1949     11/11/16 2000  ampicillin (OMNIPEN) 2 g in sodium chloride 0.9 % 50 mL IVPB     2 g 150 mL/hr over 20 Minutes Intravenous Every 6 hours 11/11/16 1950 11/11/16 2200      Total days of antibiotics: 0 amp/vanco/ceftriaxone          Social History:  reports that he has quit smoking. He quit after 6.00 years of use. He quit smokeless tobacco use about 66 years ago. He reports that he drinks about 3.6 oz of alcohol per week . He reports that he does not use drugs.  Family History  Problem Relation Age of Onset  . Anesthesia problems Neg Hx     General ROS: eating well, normal BM, normal urine, had fever that resolved quickly with tylenol per wife. see HPI.   Blood pressure 123/84, pulse 95, temperature 98.8 F (37.1 C), temperature source Axillary, resp. rate (!) 24, height '5\' 10"'  (1.778 m), weight 104.9 kg (231 lb 4.8 oz), SpO2 98 %. General appearance: alert, cooperative and no distress Eyes: negative findings: conjunctivae and sclerae normal and pupils equal, round, reactive to light and accomodation Throat: normal findings: oropharynx pink & moist without lesions or evidence of thrush Neck: no adenopathy and supple, symmetrical, trachea midline Lungs: clear to auscultation bilaterally Heart: regular rate and rhythm Abdomen: normal findings: bowel sounds normal and soft, non-tender Extremities: edema 3+ BLE   Results for orders placed or performed during the hospital encounter of 11/11/16 (from the past 48 hour(s))  CBC     Status: Abnormal   Collection Time: 11/11/16  7:51 PM  Result Value Ref Range   WBC 16.3 (H) 4.0 - 10.5 K/uL   RBC 3.60 (L) 4.22 - 5.81 MIL/uL   Hemoglobin 10.2 (L) 13.0 - 17.0 g/dL   HCT 32.5 (L) 39.0 - 52.0 %   MCV 90.3 78.0 - 100.0 fL   MCH 28.3 26.0 - 34.0 pg   MCHC 31.4 30.0 - 36.0 g/dL   RDW 14.3 11.5 - 15.5 %   Platelets 207 150 - 400 K/uL  Creatinine, serum     Status: Abnormal   Collection  Time: 11/11/16  7:51 PM  Result Value Ref Range   Creatinine, Ser 1.12 0.61 - 1.24 mg/dL   GFR calc non Af Amer 57 (L) >60 mL/min   GFR calc Af Amer >60 >60 mL/min    Comment: (NOTE) The eGFR has been calculated using the CKD EPI equation. This calculation has not been validated in all clinical situations. eGFR's persistently <60 mL/min signify  possible Chronic Kidney Disease.   Comprehensive metabolic panel     Status: Abnormal   Collection Time: 11/12/16  2:01 AM  Result Value Ref Range   Sodium 134 (L) 135 - 145 mmol/L   Potassium 3.8 3.5 - 5.1 mmol/L   Chloride 99 (L) 101 - 111 mmol/L   CO2 26 22 - 32 mmol/L   Glucose, Bld 135 (H) 65 - 99 mg/dL   BUN 19 6 - 20 mg/dL   Creatinine, Ser 1.17 0.61 - 1.24 mg/dL   Calcium 7.9 (L) 8.9 - 10.3 mg/dL   Total Protein 5.4 (L) 6.5 - 8.1 g/dL   Albumin 2.4 (L) 3.5 - 5.0 g/dL   AST 27 15 - 41 U/L   ALT 32 17 - 63 U/L   Alkaline Phosphatase 73 38 - 126 U/L   Total Bilirubin 0.4 0.3 - 1.2 mg/dL   GFR calc non Af Amer 54 (L) >60 mL/min   GFR calc Af Amer >60 >60 mL/min    Comment: (NOTE) The eGFR has been calculated using the CKD EPI equation. This calculation has not been validated in all clinical situations. eGFR's persistently <60 mL/min signify possible Chronic Kidney Disease.    Anion gap 9 5 - 15  CBC     Status: Abnormal   Collection Time: 11/12/16  2:01 AM  Result Value Ref Range   WBC 16.4 (H) 4.0 - 10.5 K/uL   RBC 3.62 (L) 4.22 - 5.81 MIL/uL   Hemoglobin 10.3 (L) 13.0 - 17.0 g/dL   HCT 32.7 (L) 39.0 - 52.0 %   MCV 90.3 78.0 - 100.0 fL   MCH 28.5 26.0 - 34.0 pg   MCHC 31.5 30.0 - 36.0 g/dL   RDW 14.3 11.5 - 15.5 %   Platelets 209 150 - 400 K/uL   No results found for: SDES, SPECREQUEST, CULT, REPTSTATUS Dg Chest 2 View  Result Date: 11/12/2016 CLINICAL DATA:  Discitis. EXAM: CHEST  2 VIEW COMPARISON:  11/06/2016 FINDINGS: Cardiomegaly and large hiatal hernia. Pulmonary vascular congestion and hazy basilar opacity that  is likely atelectasis. Posttraumatic deformity of the left chest wall. Chronic upper mediastinal widening from ectatic vasculature based on recent chest CT. IMPRESSION: Cardiomegaly and pulmonary venous congestion. Intrathoracic stomach. Electronically Signed   By: Monte Fantasia M.D.   On: 11/12/2016 08:02   Mr Lumbar Spine W Wo Contrast  Addendum Date: 11/12/2016   ADDENDUM REPORT: 11/12/2016 13:03 ADDENDUM: Study discussed by telephone with Dr. Nita Sells on 11/12/2016 At 1244 hours. Electronically Signed   By: Genevie Ann M.D.   On: 11/12/2016 13:03   Result Date: 11/12/2016 CLINICAL DATA:  82 year old male who underwent lumbar epidural steroid injection on 11/04/2016. Ad[facility] to [facility] for acute encephalopathy. Lumbar [facility] at [facility] on 11/10/2016 revealing cloudy CSF with debris suspicious for meningitis. Possible sepsis. Initial encounter. EXAM: MRI LUMBAR SPINE WITHOUT AND WITH CONTRAST TECHNIQUE: Multiplanar and multiecho pulse sequences of the lumbar spine were obtained without and with intravenous contrast. CONTRAST:  68m MULTIHANCE GADOBENATE DIMEGLUMINE 529 MG/ML IV SOLN [facility] Abdomen and Pelvis 11/08/2016. Lumbar MRI 07/23/2015 FINDINGS: Segmentation: Normal, which is the same numbering system on the 2016 MRI. Alignment: Stable since 2016 with grade 1 anterolisthesis of L4 on L5 and mildly exaggerated lumbar lordosis. Vertebrae: Mild chronic anterior endplate edema at L2 appears degenerative in nature. There is abnormal increased fluid in the bilateral L5-S1 and the right L4-L5 facets, see below. There is  no definite posterior element marrow edema. Visible sacrum appears intact. Visible SI joints are within normal limits. Conus medullaris: Extends to the L1 level. There is no abnormal signal in the visible lower thoracic spinal cord or conus. See epidural abnormalities described below. No abnormal intradural  enhancement identified. Paraspinal and other soft tissues: Negative visualized abdominal viscera. Diffusely abnormal epidural space in the lumbar spine from the superior L3 to the sacral level. Elongated posterior epidural fluid collection tracking from L4-L5 into the sacrum encompassing 11 x 15 x 75 mm (AP by transverse by CC, for an estimated volume of 6-7 mL). See series 9, image 7, series 3, image 7, series 8, image 29 and series 7, image 35. Mild associated mass effect on the posterior thecal sac. Associated abnormal posterior dural thickening and heterogeneous epidural space enhancement, maximal at L4 (series 9, image 7). There is also a superimposed smaller ventral epidural abscess primarily to the left of midline at L3 and L4 best seen on series 3, image 7 and series 7 image 21. It is possible there is a superimposed subdural fluid collection tracking as high as the tip of the conus (series 7, image 1) but this is not rim enhancing. There are posterior erector spinae muscle abscesses which are small and clustered and present more so to the right of midline. These are best demonstrated on series 6 images 3 through 6. The cluster of right side muscle abscess encompasses 24 x 22 x 69 mm (AP by transverse by CC, for an estimated volume of 18 mL). See series 9, image 2. This is in association with new right side L4-L5, L5-S1, and left side L5-S1 facet joint fluid suspicious. There is superimposed diffuse lumbar subcutaneous edema. Disc levels: The above epidural abnormality exacerbates multifactorial degenerative lumbar spinal stenosis from L3-L4 to L5-S1. Heterogeneous signal in the L1-L2 disc is chronic and appears degenerative in nature. IMPRESSION: 1. Positive for multifocal Lumbar Epidural and Paraspinal Abscesses from the L3 to the S2 level. - Ventral and dorsal epidural abscesses, most pronounced from L4 to S2. Associated lumbar dural thickening and enhancement. - Right greater than left erector spinae  muscle abscesses, encompassing 2 x 2 x 7 cm on the right from the L4 to the S1 level. 2. Suspect associated Septic Facet Joints on the right at L4-L5 and L5-S1, and on the left at L5-S1. 3. No overt lumbosacral osteomyelitis and no discitis suspected at this time. Electronically Signed: By: Genevie Ann M.D. On: 11/12/2016 12:40   No results found for this or any previous visit (from the past 240 hour(s)).    11/12/2016, 4:57 PM     LOS: 1 day    Records and images were personally reviewed where available.

## 2016-11-12 NOTE — Progress Notes (Signed)
ANTICOAGULATION CONSULT NOTE - Initial Consult  Pharmacy Consult for Heparin Indication: New-onset Afib  No Known Allergies  Patient Measurements: Height: 5\' 10"  (177.8 cm) Weight: 231 lb 4.8 oz (104.9 kg) IBW/kg (Calculated) : 73 Heparin Dosing Weight: 95 kg  Vital Signs: Temp: 98.8 F (37.1 C) (12/28 1350) Temp Source: Axillary (12/28 1350) BP: 123/84 (12/28 1350) Pulse Rate: 95 (12/28 1350)  Labs:  Recent Labs  11/11/16 1951 11/12/16 0201  HGB 10.2* 10.3*  HCT 32.5* 32.7*  PLT 207 209  CREATININE 1.12 1.17    Estimated Creatinine Clearance: 53 mL/min (by C-G formula based on SCr of 1.17 mg/dL).   Medical History: Past Medical History:  Diagnosis Date  . Arthritis    R knee  . Hyperlipidemia   . Hypertension    [place].  . Ulcer ([facility])    stomach 60 years ago- no current problem    Assessment: [age] YOM s/p recent lumbosacral transforaminal epidural steroid injection 11/04/16 who [facility] >> [facility] d/t need for lumbar spine MRI given concern for epidural abscess. The patient was also noted to have new-onset Afib and pharmacy has been consulted to start Heparin for anticoagulation. Hep Wt: 95 kg, Hgb 10.3, plts 209  The patient was noted to receive Lovenox 50 mg SQ x 1 dose at 2123 on 12/27 - this may effect the initial heparin level.   Noted IR plans for paraspinal abscess aspirate on 12/29 AM - MD has asked for Heparin to be held starting at 0700.   Goal of Therapy:  Heparin level 0.3-0.7 units/ml Monitor platelets by anticoagulation protocol: Yes   Plan:  1. Discontinue Lovenox for VTE prophylaxis 2. Start Heparin at a rate of 1300 units/hr (13 ml/hr) 3. Will enter stop time of 0700 on 12/29 as requested per MD and will f/u plans to resume post-IR procedure.  4. Will continue to monitor for any signs/symptoms of bleeding and will follow up with heparin level  in 8 hours   Thank you for allowing pharmacy to be a part of this patient's care.  Georgina PillionElizabeth Zaylah Blecha, PharmD, BCPS Clinical Pharmacist Pager: [phone] 11/12/2016 5:00 PM

## 2016-11-12 NOTE — Consult Note (Signed)
Chief Complaint: Patient was seen in consultation today for epidural and paraspinal abscesses  Referring Physician(s): Dr. Rhetta Mura  Supervising Physician: Gilmer Mor  Patient Status: [facility] - In-pt  History of Present Illness: Andrew Salas is a 82 y.o. male with history of arthritis, HTN, and back pain who received a lumbosacral transforaminal epidural steroid injection 11/04/16.  Patient was admitted to [facility] 11/07/16 with acute encephalopathy and shortness of breath.   Patient is s/p lumbar puncture 12/26 with cultures and HSV studies pending. CSF did show protein 261, elevated neutrophil count, glucose 67.  MRI completed today shows: 1. Positive for multifocal Lumbar Epidural and Paraspinal Abscesses from the L3 to the S2 level. - Ventral and dorsal epidural abscesses, most pronounced from L4 to S2. Associated lumbar dural thickening and enhancement. - Right greater than left erector spinae muscle abscesses, encompassing 2 x 2 x 7 cm on the right from the L4 to the S1 level. 2. Suspect associated Septic Facet Joints on the right at L4-L5 and L5-S1, and on the left at L5-S1. 3. No overt lumbosacral osteomyelitis and no discitis suspected at this time.  IR consulted by Dr. Mahala Menghini who requests aspiration of paraspinal abscess for culture only.   Patient is currently on a regular diet.  He is not on any blood thinners.   Patient has been intermittently confused. He defers to his healthcare power of attorney who is present with him today.   Past Medical History:  Diagnosis Date  . Arthritis    R knee  . Hyperlipidemia   . Hypertension    Dr Dulce Sellar in [place] is pt's cardiologist.  . Ulcer (HCC)    stomach 60 years ago- no current problem    Past Surgical History:  Procedure Laterality Date  . CARDIAC CATHETERIZATION  6 yrs ago   [facility]  . EYE SURGERY  12/2010   Cataract bil with lens implant, please be careful if washing   eyes  . HARDWARE REMOVAL  12/25/2011   Procedure: HARDWARE REMOVAL;  Surgeon: Eldred Manges, MD;  Location: [facility] OR;  Service: Orthopedics;  Laterality: Right;   Hardware Removal Right Femur  . HERNIA REPAIR     per chest X- Ray  . KNEE ARTHROPLASTY  02/03/2012   Procedure: COMPUTER ASSISTED TOTAL KNEE ARTHROPLASTY;  Surgeon: Eldred Manges, MD;  Location: [facility] OR;  Service: Orthopedics;  Laterality: Right;  Right Total Knee Arthroplasty, Cemented  . KNEE ARTHROSCOPY     bilateral  . KNEE ARTHROSCOPY  1989   Right  . KNEE RECONSTRUCTION, MEDIAL PATELLAR FEMORAL LIGAMENT     "screws and pins inserted" from a fracture  . PROSTATE SURGERY     years ago "the Dr microwaved it"  . REPLACEMENT TOTAL KNEE      Allergies: Patient has no known allergies.  Medications: Prior to Admission medications   Medication Sig Start Date End Date Taking? Authorizing Provider  benazepril (LOTENSIN) 40 MG tablet Take 40 mg by mouth daily.   Yes Historical Provider, MD  carboxymethylcellulose (REFRESH PLUS) 0.5 % SOLN Place 1 drop into both eyes 3 (three) times daily as needed (for dryness).    Yes Historical Provider, MD  latanoprost (XALATAN) 0.005 % ophthalmic solution Place 1 drop into both eyes at bedtime.   Yes Historical Provider, MD  Multiple Vitamins-Minerals (PRESERVISION AREDS 2 PO) Take 1 tablet by mouth 2 (two) times daily.   Yes Historical Provider, MD  pravastatin (PRAVACHOL) 40  MG tablet Take 40 mg by mouth daily.   Yes Historical Provider, MD  traMADol (ULTRAM) 50 MG tablet Take 50 mg by mouth 3 (three) times daily as needed for pain. 11/06/16  Yes Historical Provider, MD     Family History  Problem Relation Age of Onset  . Anesthesia problems Neg Hx     Social History   Social History  . Marital status: Widowed    Spouse name: N/A  . Number of children: N/A  . Years of education: N/A   Social History Main Topics  . Smoking status: Former Smoker    Years: 6.00  . Smokeless tobacco:  Former Neurosurgeon    Quit date: 11/16/1950  . Alcohol use 3.6 oz/week    6 Glasses of wine per week  . Drug use: No  . Sexual activity: Not on file   Other Topics Concern  . Not on file   Social History Narrative  . No narrative on file    Review of Systems  Constitutional: Positive for activity change. Negative for fever.  Respiratory: Positive for shortness of breath (occasional, especially with exertion). Negative for cough.   Cardiovascular: Negative for chest pain.  Gastrointestinal: Negative for abdominal pain.  Musculoskeletal: Positive for back pain.  Psychiatric/Behavioral: Positive for confusion (intermittent).    Vital Signs: BP 123/84 (BP Location: Left Arm)   Pulse 95   Temp 98.8 F (37.1 C) (Axillary)   Resp (!) 24   Ht 5\' 10"  (1.778 m)   Wt 231 lb 4.8 oz (104.9 kg)   SpO2 98%   BMI 33.19 kg/m   Physical Exam  Constitutional: He appears well-developed.  Cardiovascular: Normal rate and normal heart sounds.   On cardezim drip  Pulmonary/Chest: Effort normal and breath sounds normal. No respiratory distress.  Abdominal: Soft.  Neurological: He is alert.  When awake, falls asleep easily  Skin: Skin is warm and dry.  Nursing note and vitals reviewed.   Mallampati Score:  MD Evaluation Airway: WNL Heart: WNL Abdomen: WNL Chest/ Lungs: WNL ASA  Classification: 3 Mallampati/Airway Score: Three  Imaging: Dg Chest 2 View  Result Date: 11/12/2016 CLINICAL DATA:  Discitis. EXAM: CHEST  2 VIEW COMPARISON:  11/06/2016 FINDINGS: Cardiomegaly and large hiatal hernia. Pulmonary vascular congestion and hazy basilar opacity that is likely atelectasis. Posttraumatic deformity of the left chest wall. Chronic upper mediastinal widening from ectatic vasculature based on recent chest CT. IMPRESSION: Cardiomegaly and pulmonary venous congestion. Intrathoracic stomach. Electronically Signed   By: Marnee Spring M.D.   On: 11/12/2016 08:02   Mr Lumbar Spine W Wo  Contrast  Addendum Date: 11/12/2016   ADDENDUM REPORT: 11/12/2016 13:03 ADDENDUM: Study discussed by telephone with Dr. Rhetta Mura on 11/12/2016 At 1244 hours. Electronically Signed   By: Odessa Fleming M.D.   On: 11/12/2016 13:03   Result Date: 11/12/2016 CLINICAL DATA:  82 year old male who underwent lumbar epidural steroid injection on 11/04/2016. Admitted to [facility] for acute encephalopathy. Lumbar puncture at [facility] on 11/10/2016 revealing cloudy CSF with debris suspicious for meningitis. Possible sepsis. Initial encounter. EXAM: MRI LUMBAR SPINE WITHOUT AND WITH CONTRAST TECHNIQUE: Multiplanar and multiecho pulse sequences of the lumbar spine were obtained without and with intravenous contrast. CONTRAST:  20mL MULTIHANCE GADOBENATE DIMEGLUMINE 529 MG/ML IV SOLN COMPARISON:  [facility] Lumbar puncture images 11/10/16, CT Abdomen and Pelvis 11/08/2016. Lumbar MRI 07/23/2015 FINDINGS: Segmentation: Normal, which is the same numbering system on the 2016 MRI. Alignment: Stable since 2016 with grade 1 anterolisthesis  of L4 on L5 and mildly exaggerated lumbar lordosis. Vertebrae: Mild chronic anterior endplate edema at L2 appears degenerative in nature. There is abnormal increased fluid in the bilateral L5-S1 and the right L4-L5 facets, see below. There is no definite posterior element marrow edema. Visible sacrum appears intact. Visible SI joints are within normal limits. Conus medullaris: Extends to the L1 level. There is no abnormal signal in the visible lower thoracic spinal cord or conus. See epidural abnormalities described below. No abnormal intradural enhancement identified. Paraspinal and other soft tissues: Negative visualized abdominal viscera. Diffusely abnormal epidural space in the lumbar spine from the superior L3 to the sacral level. Elongated posterior epidural fluid collection tracking from L4-L5 into the sacrum encompassing 11 x 15 x 75 mm (AP by transverse by CC, for an  estimated volume of 6-7 mL). See series 9, image 7, series 3, image 7, series 8, image 29 and series 7, image 35. Mild associated mass effect on the posterior thecal sac. Associated abnormal posterior dural thickening and heterogeneous epidural space enhancement, maximal at L4 (series 9, image 7). There is also a superimposed smaller ventral epidural abscess primarily to the left of midline at L3 and L4 best seen on series 3, image 7 and series 7 image 21. It is possible there is a superimposed subdural fluid collection tracking as high as the tip of the conus (series 7, image 1) but this is not rim enhancing. There are posterior erector spinae muscle abscesses which are small and clustered and present more so to the right of midline. These are best demonstrated on series 6 images 3 through 6. The cluster of right side muscle abscess encompasses 24 x 22 x 69 mm (AP by transverse by CC, for an estimated volume of 18 mL). See series 9, image 2. This is in association with new right side L4-L5, L5-S1, and left side L5-S1 facet joint fluid suspicious. There is superimposed diffuse lumbar subcutaneous edema. Disc levels: The above epidural abnormality exacerbates multifactorial degenerative lumbar spinal stenosis from L3-L4 to L5-S1. Heterogeneous signal in the L1-L2 disc is chronic and appears degenerative in nature. IMPRESSION: 1. Positive for multifocal Lumbar Epidural and Paraspinal Abscesses from the L3 to the S2 level. - Ventral and dorsal epidural abscesses, most pronounced from L4 to S2. Associated lumbar dural thickening and enhancement. - Right greater than left erector spinae muscle abscesses, encompassing 2 x 2 x 7 cm on the right from the L4 to the S1 level. 2. Suspect associated Septic Facet Joints on the right at L4-L5 and L5-S1, and on the left at L5-S1. 3. No overt lumbosacral osteomyelitis and no discitis suspected at this time. Electronically Signed: By: Odessa FlemingH  Hall M.D. On: 11/12/2016 12:40     Labs:  CBC:  Recent Labs  11/11/16 1951 11/12/16 0201  WBC 16.3* 16.4*  HGB 10.2* 10.3*  HCT 32.5* 32.7*  PLT 207 209    COAGS: No results for input(s): INR, APTT in the last 8760 hours.  BMP:  Recent Labs  11/11/16 1951 11/12/16 0201  NA  --  134*  K  --  3.8  CL  --  99*  CO2  --  26  GLUCOSE  --  135*  BUN  --  19  CALCIUM  --  7.9*  CREATININE 1.12 1.17  GFRNONAA 57* 54*  GFRAA >60 >60    LIVER FUNCTION TESTS:  Recent Labs  11/12/16 0201  BILITOT 0.4  AST 27  ALT 32  ALKPHOS 73  PROT 5.4*  ALBUMIN 2.4*    TUMOR MARKERS: No results for input(s): AFPTM, CEA, CA199, CHROMGRNA in the last 8760 hours.  Assessment and Plan: Epidural and paraspinal abscesses Request made for paraspinal abscess aspiration for culture purposes.  Patient has been made NPO after midnight tonight and PT-INR ordered in anticipation of procedure tomorrow.  Risks and benefits discussed with the patient and patient's HCPOA including bleeding, infection, damage to adjacent structures, and sepsis. All of the patient's and patient's HCPOA questions were answered, patient and HCPOA are agreeable to proceed. Consent signed and in chart.  Thank you for this interesting consult.  I greatly enjoyed meeting [name] and look forward to participating in their care.  A copy of this report was sent to the requesting provider on this date.  Electronically Signed: Hoyt KochKacie Sue-Ellen Jemar Paulsen 11/12/2016, 3:48 PM   I spent a total of 40 Minutes   in face to face in clinical consultation, greater than 50% of which was counseling/coordinating care for epidural and paraspinal abscesses.

## 2016-11-13 ENCOUNTER — Inpatient Hospital Stay (HOSPITAL_COMMUNITY): Payer: Medicare Other

## 2016-11-13 LAB — COMPREHENSIVE METABOLIC PANEL
ALBUMIN: 2.4 g/dL — AB (ref 3.5–5.0)
ALK PHOS: 79 U/L (ref 38–126)
ALT: 31 U/L (ref 17–63)
AST: 35 U/L (ref 15–41)
Anion gap: 12 (ref 5–15)
BILIRUBIN TOTAL: 1 mg/dL (ref 0.3–1.2)
BUN: 18 mg/dL (ref 6–20)
CHLORIDE: 96 mmol/L — AB (ref 101–111)
CO2: 26 mmol/L (ref 22–32)
Calcium: 8 mg/dL — ABNORMAL LOW (ref 8.9–10.3)
Creatinine, Ser: 1.27 mg/dL — ABNORMAL HIGH (ref 0.61–1.24)
GFR calc Af Amer: 56 mL/min — ABNORMAL LOW (ref >60)
GFR, EST NON AFRICAN AMERICAN: 49 mL/min — AB (ref >60)
Glucose, Bld: 126 mg/dL — ABNORMAL HIGH (ref 65–99)
Potassium: 4 mmol/L (ref 3.5–5.1)
Sodium: 134 mmol/L — ABNORMAL LOW (ref 135–145)
Total Protein: 5.5 g/dL — ABNORMAL LOW (ref 6.5–8.1)

## 2016-11-13 LAB — BLOOD GAS, ARTERIAL
ACID-BASE EXCESS: 10.2 mmol/L — AB (ref 0.0–2.0)
BICARBONATE: 33.9 mmol/L — AB (ref 20.0–28.0)
DRAWN BY: 23588
FIO2: 21
O2 SAT: 93.5 %
PATIENT TEMPERATURE: 98.6
PH ART: 7.52 — AB (ref 7.350–7.450)
pCO2 arterial: 41.8 mmHg (ref 32.0–48.0)
pO2, Arterial: 64.9 mmHg — ABNORMAL LOW (ref 83.0–108.0)

## 2016-11-13 LAB — HEPARIN LEVEL (UNFRACTIONATED): Heparin Unfractionated: <0.1 IU/mL — ABNORMAL LOW (ref 0.30–0.70)

## 2016-11-13 MED ORDER — RIVAROXABAN (XARELTO) EDUCATION KIT FOR DVT/PE PATIENTS
PACK | Freq: Once | Status: DC
  Filled 2016-11-13: qty 1

## 2016-11-13 MED ORDER — HEPARIN (PORCINE) IN NACL 100-0.45 UNIT/ML-% IJ SOLN
1700.0000 [IU]/h | INTRAMUSCULAR | Status: AC
  Administered 2016-11-13: 1700 [IU]/h via INTRAVENOUS

## 2016-11-13 MED ORDER — FENTANYL CITRATE (PF) 100 MCG/2ML IJ SOLN
INTRAMUSCULAR | Status: AC
  Filled 2016-11-13: qty 2

## 2016-11-13 MED ORDER — FUROSEMIDE 10 MG/ML IJ SOLN
40.0000 mg | Freq: Two times a day (BID) | INTRAMUSCULAR | Status: DC
  Administered 2016-11-13 – 2016-11-15 (×4): 40 mg via INTRAVENOUS
  Filled 2016-11-13 (×3): qty 4

## 2016-11-13 MED ORDER — MIDAZOLAM HCL 2 MG/2ML IJ SOLN
INTRAMUSCULAR | Status: AC | PRN
  Administered 2016-11-13 (×2): 0.5 mg via INTRAVENOUS

## 2016-11-13 MED ORDER — DILTIAZEM HCL ER COATED BEADS 240 MG PO CP24
240.0000 mg | ORAL_CAPSULE | Freq: Every day | ORAL | Status: DC
  Administered 2016-11-13 – 2016-11-18 (×6): 240 mg via ORAL
  Filled 2016-11-13 (×6): qty 1

## 2016-11-13 MED ORDER — LIDOCAINE HCL (PF) 1 % IJ SOLN
INTRAMUSCULAR | Status: AC
  Filled 2016-11-13: qty 30

## 2016-11-13 MED ORDER — MIDAZOLAM HCL 2 MG/2ML IJ SOLN
INTRAMUSCULAR | Status: AC
  Filled 2016-11-13: qty 2

## 2016-11-13 MED ORDER — FENTANYL CITRATE (PF) 100 MCG/2ML IJ SOLN
INTRAMUSCULAR | Status: AC | PRN
  Administered 2016-11-13 (×2): 25 ug via INTRAVENOUS

## 2016-11-13 MED ORDER — SODIUM CHLORIDE 0.9 % IV SOLN: 1.5000 g | Freq: Three times a day (TID) | INTRAVENOUS | Status: DC

## 2016-11-13 MED ORDER — HEPARIN (PORCINE) IN NACL 100-0.45 UNIT/ML-% IJ SOLN
1700.0000 [IU]/h | INTRAMUSCULAR | Status: DC
  Administered 2016-11-13: 1700 [IU]/h via INTRAVENOUS

## 2016-11-13 MED ORDER — RIVAROXABAN (XARELTO) EDUCATION KIT FOR AFIB PATIENTS
PACK | Freq: Once | Status: AC
  Administered 2016-11-13: 16:00:00
  Filled 2016-11-13: qty 1

## 2016-11-13 MED ORDER — RIVAROXABAN 20 MG PO TABS
20.0000 mg | ORAL_TABLET | Freq: Every day | ORAL | Status: DC
  Administered 2016-11-13 – 2016-11-17 (×5): 20 mg via ORAL
  Filled 2016-11-13 (×5): qty 1

## 2016-11-13 NOTE — Progress Notes (Signed)
RN notified Dr Mahala MenghiniSamtani that patient's heart rate ranging 70s-80s a-fib. MD OK with RN titrating patient's Cardizem drip to 10 from 15. MD stated to leave patient's drip running at 10 for now until further direction. RN verbalized understanding. Patient voices no complaints.

## 2016-11-13 NOTE — Procedures (Signed)
Interventional Radiology Procedure Note  Procedure:  CT guided paraspinous aspiration   Complications: None  Estimated Blood Loss: < 10 mL  Findings:  Posterior paraspinous musculature targeted at L5/S1 with 18 G spinal needle. Aspiration with movement of needle yielded about 1 mL of purulent and bloody fluid.  Combined with 1 mL of saline and sent for culture analysis.  Jodi MarbleGlenn T. Fredia SorrowYamagata, M.D Pager:  [phone]

## 2016-11-13 NOTE — Progress Notes (Signed)
ANTICOAGULATION CONSULT NOTE - Follow Up Consult  Pharmacy Consult for heparin>>>xarelto Indication: atrial fibrillation  Labs:  Recent Labs  11/11/16 1951 11/12/16 0201 11/12/16 1605 11/12/16 1651 11/13/16 0147  HGB 10.2* 10.3*  --  10.1*  --   HCT 32.5* 32.7*  --  32.2*  --   PLT 207 209  --  234  --   LABPROT  --   --  14.0  --   --   INR  --   --  1.08  --   --   HEPARINUNFRC  --   --   --   --  <0.10*  CREATININE 1.12 1.17  --   --  1.27*    Assessment: 82yo male on heparin with initial dosing for new-onset Afib. He is noted with epidural abscess and s/p aspiration by IR. Pharmacy consulted to transition UFH to xarelto.  Wt 104.9, creat 1.27. CBC stable.   Goal of Therapy:  Heparin level 0.3-0.7 units/ml   Plan:  -stop heparin at 1700 tonight and start xarelto 20 mg qsupper -xarelto education kit for Afib ordered for pt (contains 30 day free card).   Eudelia Bunch, Pharm.D. [phone] 11/13/2016 3:34 PM

## 2016-11-13 NOTE — Progress Notes (Addendum)
PROGRESS NOTE    Andrew Salas  ZOX:[id number] DOB: 25-Apr-1926 DOA: 11/11/2016 PCP: Paulina FusiSCHULTZ,DOUGLAS E, MD    Brief Narrative:  82 year old male Known history of Hypertension Chronic low back pain Hypercholesterolemia Prior right knee arthroplasty  He underwent lumbosacral transforaminal epidural steroid injection 11/04/16 with lidocaine and Depo Medrol 80 on 11/04/2016. Dr. Ronne BinningFrederick Newton of [facility]  patient [facility] 11[facility]07[facility] for acute encephalopathy. Symptoms are waxing and waning.  Also admitted for shortness of breath and dyspneic  Ultimately had LP which was concerning for Meningitis [facility] showed multi-focal abcesses and erector muscle abcesses  IR and ID consulted-input appreciated   Assessment & Plan:   Principal Problem:   Infection, possible disciitis vs meningitis Active Problems:   A-fib [facility](HCC), [facility]   Obesity hypoventilation syndrome (HCC), presumed   UTI (urinary tract infection)   Sepsis (HCC)   Epidural and paraspinal abceses -IR to obtain sample of representative collection for culture -cont Vanc/ceftriaxone--could probably d/c ampicillin, defer to ID.  Have d/c Acyclovir -discussed with radiology on admission-typically C spine abcesses would be more amenable to operation, given spinal cord phlebitis and risk to life--usually with lumbar issues, the current trend is to treat with ABx  New onset Afib-CHad2Vasc2 score=3 - heparin gtt --->Xarelto  -rate control with Cardizem GTT, RN may titrate of-converting Cardizem to PO 240 daily xl f, Metoprolol 12.5 bid  Probable [facility] some diastolic HF chest x-ray 12/28=Pulm venous congestion increase low-dose Lasix 20 twice a day for now-->.40 IV bid for 2 days and reassess am bmet and Mag in am   ?1 view CXR=PNA On Abx currently IS q4 with RN Rpt 2 vw CXR 12/30 Current antibiotic coverage  should be able to control any pulmonary infection Will ask speech therapy to see if continues to have issues as might be subclinically aspirating  Lumbago Will alert his orthopedic practice about issues above Might need alternative pain management going forward  Foley Likely can d/c this once procedure done  [facility] Needs OP testing with pulmonology  Metabolic encephalopathy Has imporved somewhat   DVT prophylaxis: Heparin/anticoagulated Code Status: Full Family Communication: discussed with Wife and family Disposition Plan:  inpatient   Consultants:   IR  ID  Procedures:   None yet  Antimicrobials:   vanc   Ampicillin  Acyclovir  ceftriaxone    Subjective:  Back from procedure earlier today Feels warm to touch Breathing slightly hard No nausea no vomiting No chest pain  Objective: Vitals:   11/13/16 1245 11/13/16 1250 11/13/16 1324 11/13/16 1550  BP: (!) 150/77 (!) 140/94 138/77   Pulse: 99 92 80   Resp: (!) 27 (!) 33    Temp:   98.5 F (36.9 C)   TempSrc:   Axillary   SpO2: 98% 98% 95% 95%  Weight:      Height:        Intake/Output Summary (Last 24 hours) at 11/13/16 1752 Last data filed at 11/13/16 0839  Gross per 24 hour  Intake          1537.55 ml  Output             1850 ml  Net          -312.45 ml   Filed Weights   11/11/16 1747  Weight: 104.9 kg (231 lb 4.8 oz)    Examination:  General exam: calm Slightly tachypneic although O2 sats off  of oxygen are in the 90s only Respiratory system: Clear to auscultation. Respiratory effort increased Cardiovascular system: S1 & S2 heard, RRR. No JVD, murmurs, rubs, gallops or clicks. No pedal edema. Gastrointestinal system: Abdomen is soft nt nd Central nervous system: Alert and oriented. No focal neurological deficits. Extremities: Symmetric 5 x 5 power. Skin: No rashes, lesions or ulcers Psychiatry: Judgement and insight appear normal. Mood & affect appropriate.     Data Reviewed: I  have personally reviewed following labs and imaging studies  CBC:  Recent Labs Lab 11/11/16 1951 11/12/16 0201 11/12/16 1651  WBC 16.3* 16.4* 16.0*  HGB 10.2* 10.3* 10.1*  HCT 32.5* 32.7* 32.2*  MCV 90.3 90.3 89.9  PLT 207 209 234   Basic Metabolic Panel:  Recent Labs Lab 11/11/16 1951 11/12/16 0201 11/13/16 0147  NA  --  134* 134*  K  --  3.8 4.0  CL  --  99* 96*  CO2  --  26 26  GLUCOSE  --  135* 126*  BUN  --  19 18  CREATININE 1.12 1.17 1.27*  CALCIUM  --  7.9* 8.0*   GFR: Estimated Creatinine Clearance: 48.8 mL/min (by C-G formula based on SCr of 1.27 mg/dL (H)). Liver Function Tests:  Recent Labs Lab 11/12/16 0201 11/13/16 0147  AST 27 35  ALT 32 31  ALKPHOS 73 79  BILITOT 0.4 1.0  PROT 5.4* 5.5*  ALBUMIN 2.4* 2.4*   No results for input(s): LIPASE, AMYLASE in the last 168 hours. No results for input(s): AMMONIA in the last 168 hours. Coagulation Profile:  Recent Labs Lab 11/12/16 1605  INR 1.08   Cardiac Enzymes: No results for input(s): CKTOTAL, CKMB, CKMBINDEX, TROPONINI in the last 168 hours. BNP (last 3 results) No results for input(s): PROBNP in the last 8760 hours. HbA1C: No results for input(s): HGBA1C in the last 72 hours. CBG: No results for input(s): GLUCAP in the last 168 hours. Lipid Profile: No results for input(s): CHOL, HDL, LDLCALC, TRIG, CHOLHDL, LDLDIRECT in the last 72 hours. Thyroid Function Tests: No results for input(s): TSH, T4TOTAL, FREET4, T3FREE, THYROIDAB in the last 72 hours. Anemia Panel: No results for input(s): VITAMINB12, FOLATE, FERRITIN, TIBC, IRON, RETICCTPCT in the last 72 hours. Sepsis Labs: No results for input(s): PROCALCITON, LATICACIDVEN in the last 168 hours.  Recent Results (from the past 240 hour(s))  Culture, blood (Routine X 2) w Reflex to ID Panel     Status: None (Preliminary result)   Collection Time: 11/12/16  6:36 PM  Result Value Ref Range Status   Specimen Description BLOOD LEFT  HAND  Final   Special Requests BOTTLES DRAWN AEROBIC AND ANAEROBIC 5CC  Final   Culture NO GROWTH < 24 HOURS  Final   Report Status PENDING  Incomplete  Aerobic/Anaerobic Culture (surgical/deep wound)     Status: None (Preliminary result)   Collection Time: 11/13/16 12:57 PM  Result Value Ref Range Status   Specimen Description ABSCESS  Final   Special Requests RIGHT LUMBOSACRAL PARASPINAL ASPIRATION IN SYRINGE  Final   Gram Stain PENDING  Incomplete   Culture PENDING  Incomplete   Report Status PENDING  Incomplete         Radiology Studies: Dg Chest 2 View  Result Date: 11/12/2016 CLINICAL DATA:  Discitis. EXAM: CHEST  2 VIEW COMPARISON:  11/06/2016 FINDINGS: Cardiomegaly and large hiatal hernia. Pulmonary vascular congestion and hazy basilar opacity that is likely atelectasis. Posttraumatic deformity of the left chest wall. Chronic upper mediastinal widening  from ectatic vasculature based on recent chest CT. IMPRESSION: Cardiomegaly and pulmonary venous congestion. Intrathoracic stomach. Electronically Signed   By: Marnee SpringJonathon  Watts M.D.   On: 11/12/2016 08:02   Mr Lumbar Spine W Wo Contrast  Addendum Date: 11/12/2016   ADDENDUM REPORT: 11/12/2016 13:03 ADDENDUM: Study discussed by telephone with Dr. Rhetta MuraJAI-GURMUKH Zalen Sequeira on 11/12/2016 At 1244 hours. Electronically Signed   By: Odessa FlemingH  Hall M.D.   On: 11/12/2016 13:03   Result Date: 11/12/2016 CLINICAL DATA:  82 year old male who underwent lumbar epidural steroid injection on 11/04/2016. [facility]. [facility] 11[facility]10/2016 revealing cloudy CSF with debris suspicious for meningitis. Possible sepsis. Initial encounter. EXAM: MRI LUMBAR SPINE WITHOUT AND WITH CONTRAST TECHNIQUE: Multiplanar and multiecho pulse sequences of the lumbar spine were obtained without and with intravenous contrast. CONTRAST:  20mL MULTIHANCE GADOBENATE DIMEGLUMINE 529 MG/ML IV SOLN COMPARISON:  [facility]bar puncture images 11/10/16, CT Abdomen and Pelvis 11/08/2016. Lumbar MRI 07/23/2015 FINDINGS: Segmentation: Normal, which is the same numbering system on the 2016 MRI. Alignment: Stable since 2016 with grade 1 anterolisthesis of L4 on L5 and mildly exaggerated lumbar lordosis. Vertebrae: Mild chronic anterior endplate edema at L2 appears degenerative in nature. There is abnormal increased fluid in the bilateral L5-S1 and the right L4-L5 facets, see below. There is no definite posterior element marrow edema. Visible sacrum appears intact. Visible SI joints are within normal limits. Conus medullaris: Extends to the L1 level. There is no abnormal signal in the visible lower thoracic spinal cord or conus. See epidural abnormalities described below. No abnormal intradural enhancement identified. Paraspinal and other soft tissues: Negative visualized abdominal viscera. Diffusely abnormal epidural space in the lumbar spine from the superior L3 to the sacral level. Elongated posterior epidural fluid collection tracking from L4-L5 into the sacrum encompassing 11 x 15 x 75 mm (AP by transverse by CC, for an estimated volume of 6-7 mL). See series 9, image 7, series 3, image 7, series 8, image 29 and series 7, image 35. Mild associated mass effect on the posterior thecal sac. Associated abnormal posterior dural thickening and heterogeneous epidural space enhancement, maximal at L4 (series 9, image 7). There is also a superimposed smaller ventral epidural abscess primarily to the left of midline at L3 and L4 best seen on series 3, image 7 and series 7 image 21. It is possible there is a superimposed subdural fluid collection tracking as high as the tip of the conus (series 7, image 1) but this is not rim enhancing. There are posterior erector spinae muscle abscesses which are small and clustered and present more so to the right of midline. These are best demonstrated on series 6 images 3 through 6. The cluster of  right side muscle abscess encompasses 24 x 22 x 69 mm (AP by transverse by CC, for an estimated volume of 18 mL). See series 9, image 2. This is in association with new right side L4-L5, L5-S1, and left side L5-S1 facet joint fluid suspicious. There is superimposed diffuse lumbar subcutaneous edema. Disc levels: The above epidural abnormality exacerbates multifactorial degenerative lumbar spinal stenosis from L3-L4 to L5-S1. Heterogeneous signal in the L1-L2 disc is chronic and appears degenerative in nature. IMPRESSION: 1. Positive for multifocal Lumbar Epidural and Paraspinal Abscesses from the L3 to the S2 level. - Ventral and dorsal epidural abscesses, most pronounced from L4 to S2. Associated lumbar dural thickening and enhancement. - Right greater than left erector spinae muscle abscesses,  encompassing 2 x 2 x 7 cm on the right from the L4 to the S1 level. 2. Suspect associated Septic Facet Joints on the right at L4-L5 and L5-S1, and on the left at L5-S1. 3. No overt lumbosacral osteomyelitis and no discitis suspected at this time. Electronically Signed: By: Odessa Fleming M.D. On: 11/12/2016 12:40   Ct Aspiration  Result Date: 11/13/2016 CLINICAL DATA:  Lumbar epidural and paraspinal abscesses. EXAM: CT GUIDED ASPIRATION OF PARASPINAL ABSCESS ANESTHESIA/SEDATION: 1.0 mg IV Versed;  50 mcg IV Fentanyl Total Moderate Sedation Time:  15 minutes The patient's level of consciousness and physiologic status were continuously monitored during the procedure by Radiology nursing. PROCEDURE: The procedure, risks, benefits, and alternatives were explained to the patient. Questions regarding the procedure were encouraged and answered. The patient understands and consents to the procedure. A time out was performed prior to initiating the procedure. The lower lumbar paraspinal region was prepped with chlorhexidine in a sterile fashion, and a sterile drape was applied covering the operative field. A sterile gown and sterile  gloves were used for the procedure. Local anesthesia was provided with 1% Lidocaine. CT was performed of the lower lumbosacral spine in a prone position. Under CT guidance, an 18 gauge spinal needle was advanced into right-sided lumbosacral paraspinal soft tissues. Aspiration was performed. Aspirate was combined with some sterile saline and sent for culture analysis. COMPLICATIONS: None FINDINGS: Aspiration was performed in the right posterior paraspinous musculature at the L5-S1 level. These yielded approximately 1 mL of purulent and bloody fluid. IMPRESSION: CT-guided aspiration at the level of right posterior paraspinous musculature at the L5-S1 level. Approximately 1 mL of purulent and bloody fluid was able to be aspirated. This was combined with an additional 1 mL of sterile saline and sent for culture analysis. Electronically Signed   By: Irish Lack M.D.   On: 11/13/2016 13:23   Dg Chest Port 1 View  Result Date: 11/13/2016 CLINICAL DATA:  82 year old male with shortness of breath. Extensive acute lumbar spinal infection. Initial encounter. EXAM: PORTABLE CHEST 1 VIEW COMPARISON:  Chest radiographs 11/12/2016 and earlier. FINDINGS: Portable AP upright view at at 0950 hours. Stable cardiomegaly and mediastinal contours. Enlarged cardiac silhouette is in part related to large gastric hiatal hernia better demonstrated on the [facility] chest CT 11/06/2016. Veiling opacity at both lung bases. Confluent retrocardiac opacity. No pneumothorax. No superimposed pulmonary edema. Visualized tracheal air column is within normal limits. Calcified aortic atherosclerosis. IMPRESSION: Bilateral lower lobe collapse or consolidation and small bilateral pleural effusions are new since 11/06/2016. Calcified aortic atherosclerosis. Electronically Signed   By: Odessa Fleming M.D.   On: 11/13/2016 10:12        Scheduled Meds: . ampicillin (OMNIPEN) IV  2 g Intravenous Q4H  . cefTRIAXone (ROCEPHIN)  IV  2 g  Intravenous Q12H  . diltiazem  240 mg Oral Daily  . fentaNYL      . furosemide  20 mg Oral BID  . lidocaine (PF)      . loratadine  10 mg Oral Daily  . metoprolol tartrate  12.5 mg Oral BID  . midazolam      . pravastatin  40 mg Oral Daily  . rivaroxaban  20 mg Oral Q supper  . traMADol  50 mg Oral Q6H  . vancomycin  1,250 mg Intravenous Q24H   Continuous Infusions:    LOS: 2 days    Time spent: 35    Rhetta Mura, MD [facility] Pager [phone]  If 7PM-7AM, please contact night-coverage [email or web address] Password [email or web address] 11/13/2016, 5:52 PM

## 2016-11-13 NOTE — Progress Notes (Signed)
ANTICOAGULATION CONSULT NOTE - Follow Up Consult  Pharmacy Consult for heparin Indication: atrial fibrillation  Labs:  Recent Labs  11/11/16 1951 11/12/16 0201 11/12/16 1605 11/12/16 1651 11/13/16 0147  HGB 10.2* 10.3*  --  10.1*  --   HCT 32.5* 32.7*  --  32.2*  --   PLT 207 209  --  234  --   LABPROT  --   --  14.0  --   --   INR  --   --  1.08  --   --   HEPARINUNFRC  --   --   --   --  <0.10*  CREATININE 1.12 1.17  --   --  1.27*    Assessment: 82yo male undetectable on heparin with initial dosing for new-onset Afib. He is noted with epidural abscess and s/p aspiration by IR. Per MD okay to restart heparin -lasy heparin rate was 1700 units/hr  Goal of Therapy:  Heparin level 0.3-0.7 units/ml   Plan:  -restart heparin at 1700 units/hr -Heparin level in 8 hours and daily wth CBC daily -Will follow oral anticoagulation plans  Harland Germanndrew Makinna Andy, Pharm D 11/13/2016 1:05 PM

## 2016-11-13 NOTE — Progress Notes (Signed)
RN spoke to pharmacist about IV compatibility of continuous infusions. Patient has two working Orthoptistvs and patient's family requesting patient not be stuck again due to difficulty with attempts at starting an Iv. Pharmacist stated OK to run Cardizem and heparin together as they are compatible. Second IV used for antibiotics

## 2016-11-13 NOTE — Progress Notes (Signed)
ANTICOAGULATION CONSULT NOTE - Follow Up Consult  Pharmacy Consult for heparin Indication: atrial fibrillation  Labs:  Recent Labs  11/11/16 1951 11/12/16 0201 11/12/16 1605 11/12/16 1651 11/13/16 0147  HGB 10.2* 10.3*  --  10.1*  --   HCT 32.5* 32.7*  --  32.2*  --   PLT 207 209  --  234  --   LABPROT  --   --  14.0  --   --   INR  --   --  1.08  --   --   HEPARINUNFRC  --   --   --   --  <0.10*  CREATININE 1.12 1.17  --   --  1.27*    Assessment: 82yo male undetectable on heparin with initial dosing for new-onset Afib.  Goal of Therapy:  Heparin level 0.3-0.7 units/ml   Plan:  Will increase heparin gtt by 4 units/kg/hr to 1700 units/hr until off at 0700 for IR procedure and f/u after to resume.  Andrew Salas, PharmD, BCPS  11/13/2016,3:49 AM

## 2016-11-14 ENCOUNTER — Inpatient Hospital Stay (HOSPITAL_COMMUNITY): Payer: Medicare Other

## 2016-11-14 LAB — COMPREHENSIVE METABOLIC PANEL
ALT: 30 U/L (ref 17–63)
ANION GAP: 12 (ref 5–15)
AST: 30 U/L (ref 15–41)
Albumin: 2.2 g/dL — ABNORMAL LOW (ref 3.5–5.0)
Alkaline Phosphatase: 77 U/L (ref 38–126)
BUN: 19 mg/dL (ref 6–20)
CALCIUM: 8.3 mg/dL — AB (ref 8.9–10.3)
CHLORIDE: 93 mmol/L — AB (ref 101–111)
CO2: 32 mmol/L (ref 22–32)
CREATININE: 1.26 mg/dL — AB (ref 0.61–1.24)
GFR, EST AFRICAN AMERICAN: 57 mL/min — AB (ref >60)
GFR, EST NON AFRICAN AMERICAN: 49 mL/min — AB (ref >60)
Glucose, Bld: 119 mg/dL — ABNORMAL HIGH (ref 65–99)
Potassium: 3 mmol/L — ABNORMAL LOW (ref 3.5–5.1)
SODIUM: 137 mmol/L (ref 135–145)
Total Bilirubin: 0.4 mg/dL (ref 0.3–1.2)
Total Protein: 5.5 g/dL — ABNORMAL LOW (ref 6.5–8.1)

## 2016-11-14 LAB — CBC
HCT: 34 % — ABNORMAL LOW (ref 39.0–52.0)
Hemoglobin: 10.9 g/dL — ABNORMAL LOW (ref 13.0–17.0)
MCH: 28.6 pg (ref 26.0–34.0)
MCHC: 32.1 g/dL (ref 30.0–36.0)
MCV: 89.2 fL (ref 78.0–100.0)
PLATELETS: 342 10*3/uL (ref 150–400)
RBC: 3.81 MIL/uL — AB (ref 4.22–5.81)
RDW: 14.1 % (ref 11.5–15.5)
WBC: 14.2 10*3/uL — ABNORMAL HIGH (ref 4.0–10.5)

## 2016-11-14 MED ORDER — BISACODYL 5 MG PO TBEC: 5.0000 mg | DELAYED_RELEASE_TABLET | Freq: Every day | ORAL | Status: DC | PRN

## 2016-11-14 MED ORDER — GUAIFENESIN-DM 100-10 MG/5ML PO SYRP
5.0000 mL | ORAL_SOLUTION | ORAL | Status: DC | PRN
  Administered 2016-11-14 – 2016-11-15 (×2): 5 mL via ORAL
  Filled 2016-11-14 (×2): qty 5

## 2016-11-14 MED ORDER — DOCUSATE SODIUM 100 MG PO CAPS: 100.0000 mg | ORAL_CAPSULE | Freq: Two times a day (BID) | ORAL | Status: DC | PRN

## 2016-11-14 MED ORDER — MAGNESIUM CITRATE PO SOLN
1.0000 | Freq: Once | ORAL | Status: AC | PRN
  Administered 2016-11-14: 1 via ORAL
  Filled 2016-11-14: qty 296

## 2016-11-14 NOTE — Progress Notes (Signed)
Pharmacy Antibiotic Note  Andrew Salas is a 82 y.o. male admitted on 11/11/2016 with acute encephalopathy.  Patient has a history of lumbosacral transforaminal epidural steroid injection on 11/04/16.  He is on vancomycin and ceftriaxone for epidural and paraspinal abscesses.  -WBC= 14.2, afebrile, cultures ngtd  Plan: - No antibiotic dose changes -Will check a vancomycin trough on 12/31 -Will follow renal function, cultures and clinical progress   Height: 5\' 10"  (177.8 cm) Weight: 231 lb 4.8 oz (104.9 kg) IBW/kg (Calculated) : 73  AdjBW = 86 kg  Temp (24hrs), Avg:98.4 F (36.9 C), Min:98 F (36.7 C), Max:98.6 F (37 C)   Recent Labs Lab 11/11/16 1951 11/12/16 0201 11/12/16 1651 11/13/16 0147 11/14/16 0212  WBC 16.3* 16.4* 16.0*  --  14.2*  CREATININE 1.12 1.17  --  1.27* 1.26*    Estimated Creatinine Clearance: 49.2 mL/min (by C-G formula based on SCr of 1.26 mg/dL (H)).    No Known Allergies  Antimicrobials this admission:  Vanc 12/26 PTA >> CTX 12/26 PTA  >> Acyclovir 12/26 PTA >> 12/28 Ampicillin 12/27 >> Cipro PTA  Dose adjustments this admission:  N/A  Microbiology results:  12/28 blood x2- ngtd   Harland GermanAndrew Jerrel Tiberio, Pharm D 11/14/2016 12:50 PM

## 2016-11-14 NOTE — Progress Notes (Signed)
Continue on broad spectrum abtx until culture results return to determine his final regimen. Anticipate to treat for 6 wk

## 2016-11-14 NOTE — Consult Note (Signed)
Reason for Consult: Lumbar epidural abscess Referring Physician: Medicine  Jaime Grizzell is an 82 y.o. male.  HPI: 82 year old male admitted for mental status changes, fever and meningismus. Patient recently status post epidural steroid injection. Patient with increased back pain upon admission. Workup demonstrates evidence of multifocal paraspinal and lumbar epidural abscess. Minimal if any canal compromise. No current symptoms of radiating pain, numbness or weakness. Patient much improved after institution of antibiotics. Currently he denies back pain. He's having no radicular pain. He's having no bowel or bladder dysfunction. Patient did undergo precuneus aspiration by radiology. Cultures no growth so far.  Past Medical History:  Diagnosis Date  . Arthritis    R knee  . Hyperlipidemia   . Hypertension    Dr Bettina Gavia in [place] is pt's cardiologist.  . Ulcer ([place])    stomach 60 years ago- no current problem    Past Surgical History:  Procedure Laterality Date  . CARDIAC CATHETERIZATION  6 yrs ago   [facility]  . EYE SURGERY  12/2010   Cataract bil with lens implant, please be careful if washing  eyes  . HARDWARE REMOVAL  12/25/2011   Procedure: HARDWARE REMOVAL;  Surgeon: Marybelle Killings, MD;  Location: [place];  Service: Orthopedics;  Laterality: Right;   Hardware Removal Right Femur  . HERNIA REPAIR     per chest X- Ray  . KNEE ARTHROPLASTY  02/03/2012   Procedure: COMPUTER ASSISTED TOTAL KNEE ARTHROPLASTY;  Surgeon: Marybelle Killings, MD;  Location: [place];  Service: Orthopedics;  Laterality: Right;  Right Total Knee Arthroplasty, Cemented  . KNEE ARTHROSCOPY     bilateral  . KNEE ARTHROSCOPY  1989   Right  . KNEE RECONSTRUCTION, MEDIAL PATELLAR FEMORAL LIGAMENT     "screws and pins inserted" from a fracture  . PROSTATE SURGERY     years ago "the Dr microwaved it"  . REPLACEMENT TOTAL KNEE      Family History  Problem Relation Age of Onset  . Anesthesia problems Neg Hx      Social History:  reports that he has quit smoking. He quit after 6.00 years of use. He quit smokeless tobacco use about 66 years ago. He reports that he drinks about 3.6 oz of alcohol per week . He reports that he does not use drugs.  Allergies: No Known Allergies  Medications: I have reviewed the patient's current medications.  Results for orders placed or performed during the hospital encounter of 11/11/16 (from the past 48 hour(s))  Culture, blood (Routine X 2) w Reflex to ID Panel     Status: None (Preliminary result)   Collection Time: 11/12/16  6:36 PM  Result Value Ref Range   Specimen Description BLOOD LEFT HAND    Special Requests BOTTLES DRAWN AEROBIC AND ANAEROBIC 5CC    Culture NO GROWTH 2 DAYS    Report Status PENDING   Comprehensive metabolic panel     Status: Abnormal   Collection Time: 11/13/16  1:47 AM  Result Value Ref Range   Sodium 134 (L) 135 - 145 mmol/L   Potassium 4.0 3.5 - 5.1 mmol/L   Chloride 96 (L) 101 - 111 mmol/L   CO2 26 22 - 32 mmol/L   Glucose, Bld 126 (H) 65 - 99 mg/dL   BUN 18 6 - 20 mg/dL   Creatinine, Ser 1.27 (H) 0.61 - 1.24 mg/dL   Calcium 8.0 (L) 8.9 - 10.3 mg/dL   Total Protein 5.5 (L) 6.5 -  8.1 g/dL   Albumin 2.4 (L) 3.5 - 5.0 g/dL   AST 35 15 - 41 U/L   ALT 31 17 - 63 U/L   Alkaline Phosphatase 79 38 - 126 U/L   Total Bilirubin 1.0 0.3 - 1.2 mg/dL   GFR calc non Af Amer 49 (L) >60 mL/min   GFR calc Af Amer 56 (L) >60 mL/min    Comment: (NOTE) The eGFR has been calculated using the CKD EPI equation. This calculation has not been validated in all clinical situations. eGFR's persistently <60 mL/min signify possible Chronic Kidney Disease.    Anion gap 12 5 - 15  Heparin level (unfractionated)     Status: Abnormal   Collection Time: 11/13/16  1:47 AM  Result Value Ref Range   Heparin Unfractionated <0.10 (L) 0.30 - 0.70 IU/mL    Comment:        IF HEPARIN RESULTS ARE BELOW EXPECTED VALUES, AND PATIENT DOSAGE HAS BEEN  CONFIRMED, SUGGEST FOLLOW UP TESTING OF ANTITHROMBIN III LEVELS. REPEATED TO VERIFY   Aerobic/Anaerobic Culture (surgical/deep wound)     Status: None (Preliminary result)   Collection Time: 11/13/16 12:57 PM  Result Value Ref Range   Specimen Description ABSCESS    Special Requests RIGHT LUMBOSACRAL PARASPINAL ASPIRATION IN SYRINGE    Gram Stain      ABUNDANT WBC PRESENT, PREDOMINANTLY PMN NO ORGANISMS SEEN    Culture NO GROWTH 1 DAY    Report Status PENDING   Blood gas, arterial     Status: Abnormal   Collection Time: 11/13/16  4:20 PM  Result Value Ref Range   FIO2 21.00    Delivery systems ROOM AIR    pH, Arterial 7.520 (H) 7.350 - 7.450   pCO2 arterial 41.8 32.0 - 48.0 mmHg   pO2, Arterial 64.9 (L) 83.0 - 108.0 mmHg   Bicarbonate 33.9 (H) 20.0 - 28.0 mmol/L   Acid-Base Excess 10.2 (H) 0.0 - 2.0 mmol/L   O2 Saturation 93.5 %   Patient temperature 98.6    Collection site LEFT RADIAL    Drawn by [id number]    Sample type ARTERIAL DRAW    Allens test (pass/fail) PASS PASS  CBC     Status: Abnormal   Collection Time: 11/14/16  2:12 AM  Result Value Ref Range   WBC 14.2 (H) 4.0 - 10.5 K/uL   RBC 3.81 (L) 4.22 - 5.81 MIL/uL   Hemoglobin 10.9 (L) 13.0 - 17.0 g/dL   HCT 34.0 (L) 39.0 - 52.0 %   MCV 89.2 78.0 - 100.0 fL   MCH 28.6 26.0 - 34.0 pg   MCHC 32.1 30.0 - 36.0 g/dL   RDW 14.1 11.5 - 15.5 %   Platelets 342 150 - 400 K/uL  Comprehensive metabolic panel     Status: Abnormal   Collection Time: 11/14/16  2:12 AM  Result Value Ref Range   Sodium 137 135 - 145 mmol/L   Potassium 3.0 (L) 3.5 - 5.1 mmol/L    Comment: DELTA CHECK NOTED   Chloride 93 (L) 101 - 111 mmol/L   CO2 32 22 - 32 mmol/L   Glucose, Bld 119 (H) 65 - 99 mg/dL   BUN 19 6 - 20 mg/dL   Creatinine, Ser 1.26 (H) 0.61 - 1.24 mg/dL   Calcium 8.3 (L) 8.9 - 10.3 mg/dL   Total Protein 5.5 (L) 6.5 - 8.1 g/dL   Albumin 2.2 (L) 3.5 - 5.0 g/dL   AST 30 15 - 41 U/L  ALT 30 17 - 63 U/L   Alkaline Phosphatase 77  38 - 126 U/L   Total Bilirubin 0.4 0.3 - 1.2 mg/dL   GFR calc non Af Amer 49 (L) >60 mL/min   GFR calc Af Amer 57 (L) >60 mL/min    Comment: (NOTE) The eGFR has been calculated using the CKD EPI equation. This calculation has not been validated in all clinical situations. eGFR's persistently <60 mL/min signify possible Chronic Kidney Disease.    Anion gap 12 5 - 15    Dg Chest 2 View  Result Date: 11/14/2016 CLINICAL DATA:  Shortness of breath.  History of recent pneumonia. EXAM: CHEST  2 VIEW COMPARISON:  11/13/2016 FINDINGS: The heart is enlarged but stable. Stable tortuosity and ectasia of the thoracic aorta. Persistent bibasilar infiltrates or atelectasis and small effusions. IMPRESSION: Persistent bibasilar process and small effusions. Stable cardiac enlargement.  No pulmonary edema. Electronically Signed   By: Marijo Sanes M.D.   On: 11/14/2016 08:58   Ct Aspiration  Result Date: 11/13/2016 CLINICAL DATA:  Lumbar epidural and paraspinal abscesses. EXAM: CT GUIDED ASPIRATION OF PARASPINAL ABSCESS ANESTHESIA/SEDATION: 1.0 mg IV Versed;  50 mcg IV Fentanyl Total Moderate Sedation Time:  15 minutes The patient's level of consciousness and physiologic status were continuously monitored during the procedure by Radiology nursing. PROCEDURE: The procedure, risks, benefits, and alternatives were explained to the patient. Questions regarding the procedure were encouraged and answered. The patient understands and consents to the procedure. A time out was performed prior to initiating the procedure. The lower lumbar paraspinal region was prepped with chlorhexidine in a sterile fashion, and a sterile drape was applied covering the operative field. A sterile gown and sterile gloves were used for the procedure. Local anesthesia was provided with 1% Lidocaine. CT was performed of the lower lumbosacral spine in a prone position. Under CT guidance, an 18 gauge spinal needle was advanced into right-sided  lumbosacral paraspinal soft tissues. Aspiration was performed. Aspirate was combined with some sterile saline and sent for culture analysis. COMPLICATIONS: None FINDINGS: Aspiration was performed in the right posterior paraspinous musculature at the L5-S1 level. These yielded approximately 1 mL of purulent and bloody fluid. IMPRESSION: CT-guided aspiration at the level of right posterior paraspinous musculature at the L5-S1 level. Approximately 1 mL of purulent and bloody fluid was able to be aspirated. This was combined with an additional 1 mL of sterile saline and sent for culture analysis. Electronically Signed   By: Aletta Edouard M.D.   On: 11/13/2016 13:23   Dg Chest Port 1 View  Result Date: 11/13/2016 CLINICAL DATA:  82 year old male with shortness of breath. Extensive acute lumbar spinal infection. Initial encounter. EXAM: PORTABLE CHEST 1 VIEW COMPARISON:  Chest radiographs 11/12/2016 and earlier. FINDINGS: Portable AP upright view at at 0950 hours. Stable cardiomegaly and mediastinal contours. Enlarged cardiac silhouette is in part related to large gastric hiatal hernia better demonstrated on the [facility] chest CT 11/06/2016. Veiling opacity at both lung bases. Confluent retrocardiac opacity. No pneumothorax. No superimposed pulmonary edema. Visualized tracheal air column is within normal limits. Calcified aortic atherosclerosis. IMPRESSION: Bilateral lower lobe collapse or consolidation and small bilateral pleural effusions are new since 11/06/2016. Calcified aortic atherosclerosis. Electronically Signed   By: Genevie Ann M.D.   On: 11/13/2016 10:12    Pertinent items noted in HPI and remainder of comprehensive ROS otherwise negative. Blood pressure 123/83, pulse 85, temperature 97.5 F (36.4 C), temperature source Oral, resp. rate 18, height 5'  10" (1.778 m), weight 104.9 kg (231 lb 4.8 oz), SpO2 98 %. Patient is awake and alert. He is oriented and appropriate. His cranial nerve  function is intact. His judgment and insight are normal. Speech is fluent. Motor 5/5 bilateral upper and lower extremities. Straight leg raising negative bilaterally.  Assessment/Plan: Although this patient does have evidence of a number of loculated fluid collections within his dorsal lumbar epidural space none of these collections are prickly large or compressive. At this point I would continue his current IV antibiotics. Await final culture results. Given negative cultures so far patient should be covered for the most likely source of infection which would be staph aureus. No indication for surgical intervention at this point.  Drevon Plog A 11/14/2016, 5:19 PM

## 2016-11-15 DIAGNOSIS — E662 Morbid (severe) obesity with alveolar hypoventilation: Secondary | ICD-10-CM

## 2016-11-15 DIAGNOSIS — A419 Sepsis, unspecified organism: Principal | ICD-10-CM

## 2016-11-15 LAB — CBC WITH DIFFERENTIAL/PLATELET
BASOS PCT: 0 %
Basophils Absolute: 0 10*3/uL (ref 0.0–0.1)
EOS PCT: 1 %
Eosinophils Absolute: 0.1 10*3/uL (ref 0.0–0.7)
HEMATOCRIT: 35 % — AB (ref 39.0–52.0)
Hemoglobin: 11.2 g/dL — ABNORMAL LOW (ref 13.0–17.0)
Lymphocytes Relative: 9 %
Lymphs Abs: 1.3 10*3/uL (ref 0.7–4.0)
MCH: 28.7 pg (ref 26.0–34.0)
MCHC: 32 g/dL (ref 30.0–36.0)
MCV: 89.7 fL (ref 78.0–100.0)
MONO ABS: 0.8 10*3/uL (ref 0.1–1.0)
Monocytes Relative: 6 %
NEUTROS ABS: 11.9 10*3/uL — AB (ref 1.7–7.7)
Neutrophils Relative %: 84 %
PLATELETS: 377 10*3/uL (ref 150–400)
RBC: 3.9 MIL/uL — ABNORMAL LOW (ref 4.22–5.81)
RDW: 14.1 % (ref 11.5–15.5)
WBC: 14.2 10*3/uL — ABNORMAL HIGH (ref 4.0–10.5)

## 2016-11-15 LAB — COMPREHENSIVE METABOLIC PANEL
ALBUMIN: 2.3 g/dL — AB (ref 3.5–5.0)
ALT: 34 U/L (ref 17–63)
AST: 35 U/L (ref 15–41)
Alkaline Phosphatase: 74 U/L (ref 38–126)
Anion gap: 12 (ref 5–15)
BUN: 27 mg/dL — AB (ref 6–20)
CHLORIDE: 92 mmol/L — AB (ref 101–111)
CO2: 34 mmol/L — AB (ref 22–32)
Calcium: 8.4 mg/dL — ABNORMAL LOW (ref 8.9–10.3)
Creatinine, Ser: 1.43 mg/dL — ABNORMAL HIGH (ref 0.61–1.24)
GFR calc Af Amer: 49 mL/min — ABNORMAL LOW (ref >60)
GFR calc non Af Amer: 42 mL/min — ABNORMAL LOW (ref >60)
GLUCOSE: 124 mg/dL — AB (ref 65–99)
POTASSIUM: 3.2 mmol/L — AB (ref 3.5–5.1)
Sodium: 138 mmol/L (ref 135–145)
Total Bilirubin: 0.9 mg/dL (ref 0.3–1.2)
Total Protein: 5.6 g/dL — ABNORMAL LOW (ref 6.5–8.1)

## 2016-11-15 LAB — MAGNESIUM: Magnesium: 2 mg/dL (ref 1.7–2.4)

## 2016-11-15 LAB — VANCOMYCIN, TROUGH: Vancomycin Tr: 15 ug/mL (ref 15–20)

## 2016-11-15 MED ORDER — POTASSIUM CHLORIDE CRYS ER 20 MEQ PO TBCR
40.0000 meq | EXTENDED_RELEASE_TABLET | Freq: Two times a day (BID) | ORAL | Status: DC
  Administered 2016-11-15 – 2016-11-17 (×4): 40 meq via ORAL
  Filled 2016-11-15 (×5): qty 2

## 2016-11-15 MED ORDER — FUROSEMIDE 40 MG PO TABS
40.0000 mg | ORAL_TABLET | Freq: Every day | ORAL | Status: DC
  Administered 2016-11-16 – 2016-11-18 (×3): 40 mg via ORAL
  Filled 2016-11-15 (×3): qty 1

## 2016-11-15 NOTE — Discharge Instructions (Signed)

## 2016-11-15 NOTE — Progress Notes (Signed)
No issues overnight. Pt reports no back or leg pain.  EXAM:  BP (!) 150/78 (BP Location: Left Arm)   Pulse 81   Temp 98 F (36.7 C) (Oral)   Resp 15   Ht 5\' 10"  (1.778 m)   Wt 104.9 kg (231 lb 4.8 oz)   SpO2 95%   BMI 33.19 kg/m   Awake, alert, oriented  Speech fluent, appropriate  CN grossly intact  5/5 BUE/BLE   IMPRESSION:  82 y.o. male with lumbar SEA, minimal canal compromise and neurologically intact. No need for surgical debridement.  PLAN: - Cont IV abx - Please call with questions, or any change in neurologic condition

## 2016-11-15 NOTE — Progress Notes (Signed)
[facility]    Date of Admission:  11/11/2016   Total days of antibiotics 5        Day 5 amp        Day 5 ctx        Day 5 vanco   ID: [name] is a 82 y.o. male with lumbar SEA and paraspinal abscess Principal Problem:   Infection, possible disciitis vs meningitis Active Problems:   A-fib [facility](HCC[facility]   Obesity hypoventilation syndrome (HCC), presumed   UTI (urinary tract infection)   Sepsis (HCC)    Subjective: Afebrile, feeling well, no back pain. Evaluated by neurosurgery who felt that he can be medically managed with abtx Medications:  . ampicillin (OMNIPEN) IV  2 g Intravenous Q4H  . cefTRIAXone (ROCEPHIN)  IV  2 g Intravenous Q12H  . diltiazem  240 mg Oral Daily  . furosemide  40 mg Intravenous BID  . loratadine  10 mg Oral Daily  . pravastatin  40 mg Oral Daily  . rivaroxaban  20 mg Oral Q supper  . traMADol  50 mg Oral Q6H  . vancomycin  1,250 mg Intravenous Q24H    Objective: Vital signs in last 24 hours: Temp:  [97.5 F (36.4 C)-98 F (36.7 C)] 98 F (36.7 C) (12/31 0450) Pulse Rate:  [81-90] 81 (12/31 0450) Resp:  [15-18] 15 (12/31 0450) BP: (123-150)/(78-86) 150/78 (12/31 0450) SpO2:  [94 %-98 %] 95 % (12/31 0450)  Physical Exam  Constitutional: He is oriented to person, place, and time. He appears well-developed and well-nourished. No distress.  HENT:  Mouth/Throat: Oropharynx is clear and moist. No oropharyngeal exudate.  Cardiovascular: Normal rate, regular rhythm and normal heart sounds. Exam reveals no gallop and no friction rub.  No murmur heard.  Pulmonary/Chest: Effort normal and breath sounds normal. No respiratory distress. He has no wheezes.  Abdominal: Soft. Bowel sounds are normal. He exhibits no distension. There is no tenderness.  Lymphadenopathy:  He has no cervical adenopathy.  Neurological: He is alert and oriented to person, place, and time.  Skin: Skin is warm  and dry. No rash noted. No erythema.  Psychiatric: He has a normal mood and affect. His behavior is normal.   Lab Results  Recent Labs  11/14/16 0212 11/15/16 0306  WBC 14.2* 14.2*  HGB 10.9* 11.2*  HCT 34.0* 35.0*  NA 137 138  K 3.0* 3.2*  CL 93* 92*  CO2 32 34*  BUN 19 27*  CREATININE 1.26* 1.43*   Liver Panel  Recent Labs  11/14/16 0212 11/15/16 0306  PROT 5.5* 5.6*  ALBUMIN 2.2* 2.3*  AST 30 35  ALT 30 34  ALKPHOS 77 74  BILITOT 0.4 0.9    Microbiology: 12/28 blood cx ngtd 12/29 aspirate cx ngtd Studies/Results: Dg Chest 2 View  Result Date: 11/14/2016 CLINICAL DATA:  Shortness of breath.  History of recent pneumonia. EXAM: CHEST  2 VIEW COMPARISON:  11/13/2016 FINDINGS: The heart is enlarged but stable. Stable tortuosity and ectasia of the thoracic aorta. Persistent bibasilar infiltrates or atelectasis and small effusions. IMPRESSION: Persistent bibasilar process and small effusions. Stable cardiac enlargement.  No pulmonary edema. Electronically Signed   By: Rudie MeyerP.  Gallerani M.D.   On: 11/14/2016 08:58   Ct Aspiration  Result Date: 11/13/2016 CLINICAL DATA:  Lumbar epidural and paraspinal abscesses. EXAM: CT GUIDED ASPIRATION OF PARASPINAL ABSCESS ANESTHESIA/SEDATION: 1.0 mg IV Versed;  50 mcg IV Fentanyl Total  Moderate Sedation Time:  15 minutes The patient's level of consciousness and physiologic status were continuously monitored during the procedure by Radiology nursing. PROCEDURE: The procedure, risks, benefits, and alternatives were explained to the patient. Questions regarding the procedure were encouraged and answered. The patient understands and consents to the procedure. A time out was performed prior to initiating the procedure. The lower lumbar paraspinal region was prepped with chlorhexidine in a sterile fashion, and a sterile drape was applied covering the operative field. A sterile gown and sterile gloves were used for the procedure. Local anesthesia was  provided with 1% Lidocaine. CT was performed of the lower lumbosacral spine in a prone position. Under CT guidance, an 18 gauge spinal needle was advanced into right-sided lumbosacral paraspinal soft tissues. Aspiration was performed. Aspirate was combined with some sterile saline and sent for culture analysis. COMPLICATIONS: None FINDINGS: Aspiration was performed in the right posterior paraspinous musculature at the L5-S1 level. These yielded approximately 1 mL of purulent and bloody fluid. IMPRESSION: CT-guided aspiration at the level of right posterior paraspinous musculature at the L5-S1 level. Approximately 1 mL of purulent and bloody fluid was able to be aspirated. This was combined with an additional 1 mL of sterile saline and sent for culture analysis. Electronically Signed   By: Irish LackGlenn  Yamagata M.D.   On: 11/13/2016 13:23     Assessment/Plan: Spinal epidural abscess = will recommend 6 wk of IV with ceftriaxone 2gm plus vancomycin. Can get picc line. Use 12/29 as a day 1 of 42  aki = having some increase in creatinine. Would watch carefully for vanco toxicity  Recommend to get TTE since we are not sure of source of infection.  Drue SecondSNIDER, [facility] Cell: [phone] Pager: [phone]  11/15/2016, 12:13 PM

## 2016-11-15 NOTE — Progress Notes (Signed)
Pharmacy Antibiotic Note  Andrew Salas is a 82 y.o. male admitted on 11/11/2016 with acute encephalopathy.  Patient has a history of lumbosacral transforaminal epidural steroid injection on 11/04/16.  He is on vancomycin and ceftriaxone for epidural and paraspinal abscesses. Noted plans for six weeks antibiotics. -WBC= 14.2, afebrile, cultures ngtd -vancomycin trough = 15 (~ 6:20pm)  Plan:  -Continue vancomycin 1250mg  IV q24hr -Will follow renal function, cultures and clinical progress   Height: 5\' 10"  (177.8 cm) Weight: 231 lb 4.8 oz (104.9 kg) IBW/kg (Calculated) : 73  AdjBW = 86 kg  Temp (24hrs), Avg:97.9 F (36.6 C), Min:97.7 F (36.5 C), Max:98 F (36.7 C)   Recent Labs Lab 11/11/16 1951 11/12/16 0201 11/12/16 1651 11/13/16 0147 11/14/16 0212 11/15/16 0306 11/15/16 1821  WBC 16.3* 16.4* 16.0*  --  14.2* 14.2*  --   CREATININE 1.12 1.17  --  1.27* 1.26* 1.43*  --   VANCOTROUGH  --   --   --   --   --   --  15    Estimated Creatinine Clearance: 43.3 mL/min (by C-G formula based on SCr of 1.43 mg/dL (H)).    No Known Allergies  Antimicrobials this admission:  Vanc 12/26 PTA >> CTX 12/26 PTA  >> Acyclovir 12/26 PTA >> 12/28 Ampicillin 12/27 >> Cipro PTA  Dose adjustments this admission:  N/A  Microbiology results:  12/28 blood x2- ngtd   Harland GermanAndrew Carmin Alvidrez, Pharm D 11/15/2016 7:29 PM

## 2016-11-15 NOTE — Progress Notes (Signed)
PROGRESS NOTE    Andrew Salas  ZOX:[id number] DOB: 18-Nov-1925 DOA: 11/11/2016 PCP: Paulina FusiSCHULTZ,DOUGLAS E, MD    Brief Narrative:  82 year old male with a known history of hypertension, chronic low back pain, hypercholesterolemia and prior right knee arthroplasty presented with back pain on 11/11/16.   He underwent lumbosacral transforaminal epidural steroid injection 11/04/16 with lidocaine and Depo Medrol 80 on 11/04/2016. Dr. Ronne BinningFrederick Newton of [facility]  [facility] for [facility] encephalopathy. Symptoms are waxing and waning. Also admitted for shortness of breath and dyspneic and ultimately had LP which was concerning for Meningitis [facility] showed multi-focal abcesses and erector muscle abcesses  IR and ID consulted and was neurosurgery.  Decision to treat with antibiotics was made.   Assessment & Plan:   Principal Problem:   Infection, possible disciitis vs meningitis Active Problems:   A-fib [facility](HCC), [facility]   Obesity hypoventilation syndrome (HCC), presumed   UTI (urinary tract infection)   Sepsis (HCC)   Epidural and paraspinal abceses -IR to obtain sample of representative collection for culture on 12/29 -cont Vanc/ceftriaxone--could probably d/c ampicillin, defer to ID.  Have d/c Acyclovir -discussed with radiology on admission-typically C spine abcesses would be more amenable to operation, given spinal cord phlebitis and risk to life--usually with lumbar issues, the current trend is to treat with ABx  New onset Afib-CHad2Vasc2 score=3 - heparin gtt --->Xarelto  - previously on Cardizem GTT - transitioned to Cardizem 240mg  PO daily - if rate increases can consider Metoprolol 12.5 bid  Probable AECHF - [facility] some diastolic HF - chest x-ray 12/28=Pulm venous congestion - Cr increased this am- will transition to 40mg  PO daily starting tomorow -  potassium low this am- will replete   ?1 view CXR=PNA - IS q4 with RN - Current antibiotic coverage should be able to control any pulmonary infection - continue to monitor  Lumbago - Will alert his orthopedic practice about issues above - pain controlled today  Foley - discontinue orders placed  [facility] - Needs OP testing with pulmonology  Metabolic encephalopathy - improved - patient alert and awake   DVT prophylaxis: Heparin/anticoagulated Code Status: Full Family Communication: discussed with Wife who is bedside Disposition Plan:  inpatient   Consultants:   Neurosurgery  IR  ID  Procedures:  CT guided paraspinous aspiration    11/13/16  Antimicrobials:   Vancomycin 12/28>  Ampicillin 12/29> 12/31  Acyclovir  Ceftriaxone 12/27>   Subjective: Patient seen and evaluated.  He is laying in bed and voices he has no pain today.  He is awake and alert.  No acute overnight events noted.  Objective: Vitals:   11/14/16 1343 11/14/16 1923 11/15/16 0450 11/15/16 1404  BP: 123/83 136/86 (!) 150/78 129/66  Pulse: 85 90 81 83  Resp: 18 18 15 18   Temp: 97.5 F (36.4 C) 97.7 F (36.5 C) 98 F (36.7 C) 97.7 F (36.5 C)  TempSrc: Oral Oral Oral Oral  SpO2: 98% 94% 95% 93%  Weight:      Height:        Intake/Output Summary (Last 24 hours) at 11/15/16 1454 Last data filed at 11/15/16 1400  Gross per 24 hour  Intake             3790 ml  Output             1600 ml  Net  2190 ml   Filed Weights   11/11/16 1747  Weight: 104.9 kg (231 lb 4.8 oz)    Examination:  General exam: Appears calm and comfortable  Respiratory system: Clear to auscultation. Respiratory effort normal. Cardiovascular system: S1 & S2 heard, RRR. No JVD, murmurs, rubs, gallops or clicks. No pedal edema. Gastrointestinal system: Abdomen is nondistended, soft and nontender. No organomegaly or masses felt. Normal bowel sounds heard. Central nervous system: Alert and  oriented. No focal neurological deficits. Extremities: Symmetric 5 x 5 power. Skin: No rashes, lesions or ulcers Psychiatry: Judgement and insight appear normal. Mood & affect appropriate.     Data Reviewed: I have personally reviewed following labs and imaging studies  CBC:  Recent Labs Lab 11/11/16 1951 11/12/16 0201 11/12/16 1651 11/14/16 0212 11/15/16 0306  WBC 16.3* 16.4* 16.0* 14.2* 14.2*  NEUTROABS  --   --   --   --  11.9*  HGB 10.2* 10.3* 10.1* 10.9* 11.2*  HCT 32.5* 32.7* 32.2* 34.0* 35.0*  MCV 90.3 90.3 89.9 89.2 89.7  PLT 207 209 234 342 377   Basic Metabolic Panel:  Recent Labs Lab 11/11/16 1951 11/12/16 0201 11/13/16 0147 11/14/16 0212 11/15/16 0306  NA  --  134* 134* 137 138  K  --  3.8 4.0 3.0* 3.2*  CL  --  99* 96* 93* 92*  CO2  --  26 26 32 34*  GLUCOSE  --  135* 126* 119* 124*  BUN  --  19 18 19  27*  CREATININE 1.12 1.17 1.27* 1.26* 1.43*  CALCIUM  --  7.9* 8.0* 8.3* 8.4*  MG  --   --   --   --  2.0   GFR: Estimated Creatinine Clearance: 43.3 mL/min (by C-G formula based on SCr of 1.43 mg/dL (H)). Liver Function Tests:  Recent Labs Lab 11/12/16 0201 11/13/16 0147 11/14/16 0212 11/15/16 0306  AST 27 35 30 35  ALT 32 31 30 34  ALKPHOS 73 79 77 74  BILITOT 0.4 1.0 0.4 0.9  PROT 5.4* 5.5* 5.5* 5.6*  ALBUMIN 2.4* 2.4* 2.2* 2.3*   No results for input(s): LIPASE, AMYLASE in the last 168 hours. No results for input(s): AMMONIA in the last 168 hours. Coagulation Profile:  Recent Labs Lab 11/12/16 1605  INR 1.08   Cardiac Enzymes: No results for input(s): CKTOTAL, CKMB, CKMBINDEX, TROPONINI in the last 168 hours. BNP (last 3 results) No results for input(s): PROBNP in the last 8760 hours. HbA1C: No results for input(s): HGBA1C in the last 72 hours. CBG: No results for input(s): GLUCAP in the last 168 hours. Lipid Profile: No results for input(s): CHOL, HDL, LDLCALC, TRIG, CHOLHDL, LDLDIRECT in the last 72 hours. Thyroid  Function Tests: No results for input(s): TSH, T4TOTAL, FREET4, T3FREE, THYROIDAB in the last 72 hours. Anemia Panel: No results for input(s): VITAMINB12, FOLATE, FERRITIN, TIBC, IRON, RETICCTPCT in the last 72 hours. Sepsis Labs: No results for input(s): PROCALCITON, LATICACIDVEN in the last 168 hours.  Recent Results (from the past 240 hour(s))  Culture, blood (Routine X 2) w Reflex to ID Panel     Status: None (Preliminary result)   Collection Time: 11/12/16  6:36 PM  Result Value Ref Range Status   Specimen Description BLOOD LEFT HAND  Final   Special Requests BOTTLES DRAWN AEROBIC AND ANAEROBIC 5CC  Final   Culture NO GROWTH 3 DAYS  Final   Report Status PENDING  Incomplete  Aerobic/Anaerobic Culture (surgical/deep wound)     Status: None (  Preliminary result)   Collection Time: 11/13/16 12:57 PM  Result Value Ref Range Status   Specimen Description ABSCESS  Final   Special Requests RIGHT LUMBOSACRAL PARASPINAL ASPIRATION IN SYRINGE  Final   Gram Stain   Final    ABUNDANT WBC PRESENT, PREDOMINANTLY PMN NO ORGANISMS SEEN    Culture   Final    NO GROWTH 2 DAYS NO ANAEROBES ISOLATED; CULTURE IN PROGRESS FOR 5 DAYS   Report Status PENDING  Incomplete         Radiology Studies: Dg Chest 2 View  Result Date: 11/14/2016 CLINICAL DATA:  Shortness of breath.  History of recent pneumonia. EXAM: CHEST  2 VIEW COMPARISON:  11/13/2016 FINDINGS: The heart is enlarged but stable. Stable tortuosity and ectasia of the thoracic aorta. Persistent bibasilar infiltrates or atelectasis and small effusions. IMPRESSION: Persistent bibasilar process and small effusions. Stable cardiac enlargement.  No pulmonary edema. Electronically Signed   By: Rudie MeyerP.  Gallerani M.D.   On: 11/14/2016 08:58        Scheduled Meds: . cefTRIAXone (ROCEPHIN)  IV  2 g Intravenous Q12H  . diltiazem  240 mg Oral Daily  . furosemide  40 mg Intravenous BID  . loratadine  10 mg Oral Daily  . pravastatin  40 mg Oral  Daily  . rivaroxaban  20 mg Oral Q supper  . traMADol  50 mg Oral Q6H  . vancomycin  1,250 mg Intravenous Q24H   Continuous Infusions:   LOS: 4 days    Time spent: 35 minutes    Katrinka BlazingAlex U [facility]olph, MD [facility] Pager [phone]  If 7PM-7AM, please contact night-coverage [email or web address] Password [email or web address] 11/15/2016, 2:54 PM

## 2016-11-16 LAB — BASIC METABOLIC PANEL
ANION GAP: 12 (ref 5–15)
BUN: 28 mg/dL — ABNORMAL HIGH (ref 6–20)
CALCIUM: 8.6 mg/dL — AB (ref 8.9–10.3)
CO2: 33 mmol/L — AB (ref 22–32)
CREATININE: 1.38 mg/dL — AB (ref 0.61–1.24)
Chloride: 93 mmol/L — ABNORMAL LOW (ref 101–111)
GFR calc Af Amer: 51 mL/min — ABNORMAL LOW (ref >60)
GFR calc non Af Amer: 44 mL/min — ABNORMAL LOW (ref >60)
GLUCOSE: 134 mg/dL — AB (ref 65–99)
Potassium: 3.2 mmol/L — ABNORMAL LOW (ref 3.5–5.1)
Sodium: 138 mmol/L (ref 135–145)

## 2016-11-16 NOTE — Progress Notes (Signed)
PROGRESS NOTE    [name]  ZOX:[id number] DOB: Aug 12, 1926 DOA: 11/11/2016 PCP: Paulina FusiSCHULTZ,DOUGLAS E, MD    Brief Narrative:  83 year old male with a known history of hypertension, chronic low back pain, hypercholesterolemia and prior right knee arthroplasty presented with back pain on 11/11/16.   He underwent lumbosacral transforaminal epidural steroid injection 11/04/16 with lidocaine and Depo Medrol 80 on 11/04/2016. Dr. [facility]ick [facility]  Patient [facility] for acute encephalopathy. Symptoms are waxing and waning. Also admitted for shortness of breath and dyspneic and ultimately had LP which was concerning for Meningitis [facility] showed multi-focal abcesses and erector muscle abcesses  IR and ID consulted and was neurosurgery.  Decision to treat with antibiotics was made.   Assessment & Plan:   Principal Problem:   Infection, possible disciitis vs meningitis Active Problems:   A-fib [facility](HCC), [facility]   Obesity hypoventilation syndrome (HCC), presumed   UTI (urinary tract infection)   Sepsis (HCC)   Epidural and paraspinal abceses -IR to obtain sample of representative collection for culture on 12/29 -cont Vanc/ceftriaxone--could probably d/c ampicillin, defer to ID.  Have d/c Acyclovir -discussed with radiology on admission-typically C spine abcesses would be more amenable to operation, given spinal cord phlebitis and risk to life--usually with lumbar issues, the current trend is to treat with ABx - will need PICC line but kidney function is borderline so will continue to follow (especially given that patient is on vancomycin) - PICC line ordered  New onset Afib-CHad2Vasc2 score=3 - heparin gtt --->Xarelto  - previously on Cardizem GTT - transitioned to Cardizem 240mg  PO daily - if rate increases can consider Metoprolol 12.5 bid - rate controlled a. fib  Probable  AECHF - [facility] [place] some diastolic HF - chest x-ray 12/28=Pulm venous congestion - Cr increased this am- will transition to 40mg  PO daily starting today - potassium low this am- will replete (continue KCl 40mEq BID) - repeat BMP in am   ?1 view CXR=PNA - IS q4 with RN - Current antibiotic coverage should be able to control any pulmonary infection - continue to monitor  Lumbago - pain well controlled today  Foley - discontinue orders placed  [facility] - Needs OP testing with pulmonology  Metabolic encephalopathy - improved - patient alert and awake   DVT prophylaxis: Heparin/anticoagulated with Xarelto Code Status: Full Family Communication: discussed with Wife who is bedside Disposition Plan:  inpatient   Consultants:   Neurosurgery  IR  ID  Procedures:  CT guided paraspinous aspiration    11/13/16  Antimicrobials:   Vancomycin 12/28>  Ampicillin 12/29> 12/31  Acyclovir  Ceftriaxone 12/27>   Subjective: Patient in good spirits this morning.  He is hopeful to go home soon and is asking appropriate questions.  Asking if he can get up- PT eval ordered.  Wife asking questions about home antibiotic therapy.  Objective: Vitals:   11/15/16 0450 11/15/16 1404 11/15/16 2118 11/16/16 0440  BP: (!) 150/78 129/66 133/63 (!) 138/54  Pulse: 81 83 83 80  Resp: 15 18 18 18   Temp: 98 F (36.7 C) 97.7 F (36.5 C) 97.5 F (36.4 C) 97.5 F (36.4 C)  TempSrc: Oral Oral Oral Axillary  SpO2: 95% 93% 94% (!) 84%  Weight:      Height:        Intake/Output Summary (Last 24 hours) at 11/16/16 1346 Last data filed at 11/16/16 0700  Gross per  24 hour  Intake              120 ml  Output             1900 ml  Net            -1780 ml   Filed Weights   11/11/16 1747  Weight: 104.9 kg (231 lb 4.8 oz)    Examination:  General exam: Appears calm and comfortable  Respiratory system: Clear to auscultation. Respiratory effort normal. Cardiovascular  system: S1 & S2 heard, RRR. No JVD, murmurs, rubs, gallops or clicks. No pedal edema. Gastrointestinal system: Abdomen is nondistended, soft and nontender. No organomegaly or masses felt. Normal bowel sounds heard. Central nervous system: Alert and oriented. No focal neurological deficits. Extremities: Symmetric 5 x 5 power. Skin: No rashes, lesions or ulcers Psychiatry: Judgement and insight appear normal. Mood & affect appropriate.     Data Reviewed: I have personally reviewed following labs and imaging studies  CBC:  Recent Labs Lab 11/11/16 1951 11/12/16 0201 11/12/16 1651 11/14/16 0212 11/15/16 0306  WBC 16.3* 16.4* 16.0* 14.2* 14.2*  NEUTROABS  --   --   --   --  11.9*  HGB 10.2* 10.3* 10.1* 10.9* 11.2*  HCT 32.5* 32.7* 32.2* 34.0* 35.0*  MCV 90.3 90.3 89.9 89.2 89.7  PLT 207 209 234 342 377   Basic Metabolic Panel:  Recent Labs Lab 11/12/16 0201 11/13/16 0147 11/14/16 0212 11/15/16 0306 11/16/16 1001  NA 134* 134* 137 138 138  K 3.8 4.0 3.0* 3.2* 3.2*  CL 99* 96* 93* 92* 93*  CO2 26 26 32 34* 33*  GLUCOSE 135* 126* 119* 124* 134*  BUN 19 18 19  27* 28*  CREATININE 1.17 1.27* 1.26* 1.43* 1.38*  CALCIUM 7.9* 8.0* 8.3* 8.4* 8.6*  MG  --   --   --  2.0  --    GFR: Estimated Creatinine Clearance: 44.9 mL/min (by C-G formula based on SCr of 1.38 mg/dL (H)). Liver Function Tests:  Recent Labs Lab 11/12/16 0201 11/13/16 0147 11/14/16 0212 11/15/16 0306  AST 27 35 30 35  ALT 32 31 30 34  ALKPHOS 73 79 77 74  BILITOT 0.4 1.0 0.4 0.9  PROT 5.4* 5.5* 5.5* 5.6*  ALBUMIN 2.4* 2.4* 2.2* 2.3*   No results for input(s): LIPASE, AMYLASE in the last 168 hours. No results for input(s): AMMONIA in the last 168 hours. Coagulation Profile:  Recent Labs Lab 11/12/16 1605  INR 1.08   Cardiac Enzymes: No results for input(s): CKTOTAL, CKMB, CKMBINDEX, TROPONINI in the last 168 hours. BNP (last 3 results) No results for input(s): PROBNP in the last 8760  hours. HbA1C: No results for input(s): HGBA1C in the last 72 hours. CBG: No results for input(s): GLUCAP in the last 168 hours. Lipid Profile: No results for input(s): CHOL, HDL, LDLCALC, TRIG, CHOLHDL, LDLDIRECT in the last 72 hours. Thyroid Function Tests: No results for input(s): TSH, T4TOTAL, FREET4, T3FREE, THYROIDAB in the last 72 hours. Anemia Panel: No results for input(s): VITAMINB12, FOLATE, FERRITIN, TIBC, IRON, RETICCTPCT in the last 72 hours. Sepsis Labs: No results for input(s): PROCALCITON, LATICACIDVEN in the last 168 hours.  Recent Results (from the past 240 hour(s))  Culture, blood (Routine X 2) w Reflex to ID Panel     Status: None (Preliminary result)   Collection Time: 11/12/16  6:36 PM  Result Value Ref Range Status   Specimen Description BLOOD LEFT HAND  Final   Special Requests BOTTLES  DRAWN AEROBIC AND ANAEROBIC 5CC  Final   Culture NO GROWTH 3 DAYS  Final   Report Status PENDING  Incomplete  Aerobic/Anaerobic Culture (surgical/deep wound)     Status: None (Preliminary result)   Collection Time: 11/13/16 12:57 PM  Result Value Ref Range Status   Specimen Description ABSCESS  Final   Special Requests RIGHT LUMBOSACRAL PARASPINAL ASPIRATION IN SYRINGE  Final   Gram Stain   Final    ABUNDANT WBC PRESENT, PREDOMINANTLY PMN NO ORGANISMS SEEN    Culture   Final    NO GROWTH 2 DAYS NO ANAEROBES ISOLATED; CULTURE IN PROGRESS FOR 5 DAYS   Report Status PENDING  Incomplete         Radiology Studies: No results found.      Scheduled Meds: . cefTRIAXone (ROCEPHIN)  IV  2 g Intravenous Q12H  . diltiazem  240 mg Oral Daily  . furosemide  40 mg Oral Daily  . loratadine  10 mg Oral Daily  . potassium chloride  40 mEq Oral BID  . pravastatin  40 mg Oral Daily  . rivaroxaban  20 mg Oral Q supper  . traMADol  50 mg Oral Q6H  . vancomycin  1,250 mg Intravenous Q24H   Continuous Infusions:   LOS: 5 days    Time spent: 30 minutes    Katrinka Blazing, MD [facility] Pager [phone]  If 7PM-7AM, please contact night-coverage [email or web address] Password [email or web address] 11/16/2016, 1:46 PM

## 2016-11-16 NOTE — Evaluation (Signed)
Physical Therapy Evaluation Patient Details Name: Andrew Salas MRN: [id number] DOB: 04-15-1926 Today's Date: 11/16/2016   History of Present Illness  83 yo admitted to [facility] 12/23 with encephalopathy after epidural injection 12/20, new Afib 12/25, transfer to [facility] 12/27 with epidural abscess s/p aspiration. PMHx: Rt TKA, HLD, HTN, low back pain  Clinical Impression  Pt very pleasant and eager to move. Pt with decreased transfers, gait, function and activity tolerance who will benefit from acute therapy to maximize mobility, independence and function to decrease burden of care. Encouraged HEP daily as well as mobility with nursing staff.     Follow Up Recommendations Home health PT    Equipment Recommendations  None recommended by PT    Recommendations for Other Services       Precautions / Restrictions Precautions Precautions: Fall      Mobility  Bed Mobility               General bed mobility comments: in chair on arrival  Transfers Overall transfer level: Needs assistance   Transfers: Sit to/from Stand Sit to Stand: Min assist         General transfer comment: cues for scooting, assist to block RLE, assist for anterior translation and rise from surface  Ambulation/Gait Ambulation/Gait assistance: Min assist Ambulation Distance (Feet): 70 Feet Assistive device: Rolling walker (2 wheeled) Gait Pattern/deviations: Step-through pattern;Decreased stride length;Trunk flexed   Gait velocity interpretation: Below normal speed for age/gender General Gait Details: cues for posture, position in RW and fatigue limiting distance  Stairs            Wheelchair Mobility    Modified Rankin (Stroke Patients Only)       Balance Overall balance assessment: Needs assistance   Sitting balance-Leahy Scale: Good       Standing balance-Leahy Scale: Fair                               Pertinent Vitals/Pain Pain Assessment: No/denies pain     Home Living Family/patient expects to be discharged to:: Private residence Living Arrangements: Spouse/significant other Available Help at Discharge: Friend(s);Available 24 hours/day Type of Home: House Home Access: Stairs to enter   [employer] of Steps: 1 Home Layout: One level Home Equipment: Walker - 2 wheels;Walker - 4 wheels;Electric scooter;Cane - single point      Prior Function Level of Independence: Independent with assistive device(s)         Comments: pt uses Rollator and scooter for longer distance     Hand Dominance        Extremity/Trunk Assessment   Upper Extremity Assessment Upper Extremity Assessment: Generalized weakness    Lower Extremity Assessment Lower Extremity Assessment: Generalized weakness    Cervical / Trunk Assessment Cervical / Trunk Assessment: Kyphotic  Communication   Communication: No difficulties  Cognition Arousal/Alertness: Awake/alert Behavior During Therapy: WFL for tasks assessed/performed Overall Cognitive Status: Within Functional Limits for tasks assessed                      General Comments      Exercises General Exercises - Lower Extremity Long Arc Quad: AROM;Both;10 reps;Seated Hip Flexion/Marching: AROM;Both;10 reps;Seated   Assessment/Plan    PT Assessment Patient needs continued PT services  PT Problem List Decreased strength;Decreased mobility;Decreased activity tolerance;Decreased balance;Decreased knowledge of use of DME          PT Treatment Interventions Gait training;Stair  training;Functional mobility training;Therapeutic exercise;DME instruction;Therapeutic activities;Patient/family education    PT Goals (Current goals can be found in the Care Plan section)  Acute Rehab PT Goals Patient Stated Goal: return home and go on cruises PT Goal Formulation: With patient/family Time For Goal Achievement: 11/30/16 Potential to Achieve Goals: Good    Frequency Min 3X/week    Barriers to discharge        Co-evaluation               End of Session Equipment Utilized During Treatment: Gait belt Activity Tolerance: Patient tolerated treatment well Patient left: in chair;with call bell/phone within reach;with family/visitor present;with nursing/sitter in room Nurse Communication: Mobility status         Time: [id number] PT Time Calculation (min) (ACUTE ONLY): 21 min   Charges:   PT Evaluation $PT Eval Moderate Complexity: 1 Procedure     PT G Codes:        Sacora Hawbaker B Lachlyn Vanderstelt 11/16/2016, 12:42 PM Delaney MeigsMaija Tabor Jacaria Colburn, PT [phone]

## 2016-11-16 NOTE — Progress Notes (Signed)
PROGRESS NOTE    Andrew Salas  ZOX:[id number] DOB: 05-31-1926 DOA: 11/11/2016 PCP: Paulina Fusi, MD    Brief Narrative:  83 year old male with a known history of hypertension, chronic low back pain, hypercholesterolemia and prior right knee arthroplasty presented with back pain on 11/11/16.   He underwent lumbosacral transforaminal epidural steroid injection 11/04/16 with lidocaine and Depo Medrol 80 on 11/04/2016. Dr. Ronne Binning of [facility] orthopedics  patient admitted on 11/07/2016 to [facility] for acute encephalopathy. Symptoms are waxing and waning. Also admitted for shortness of breath and dyspneic and ultimately had LP which was concerning for Meningitis MRI spine at [facility] showed multi-focal abcesses and erector muscle abcesses  IR and ID consulted and was neurosurgery.  Decision to treat with antibiotics was made.   Assessment & Plan:   Principal Problem:   Infection, possible disciitis vs meningitis Active Problems:   A-fib [facility]), new onset [facility] hospital 12/25   Obesity hypoventilation syndrome (HCC), presumed   UTI (urinary tract infection)   Sepsis (HCC)   Epidural and paraspinal abceses -IR to obtain sample of representative collection for culture on 12/29 -cont Vanc/ceftriaxone--could probably d/c ampicillin, defer to ID.  Have d/c Acyclovir -discussed with radiology on admission-typically C spine abcesses would be more amenable to operation, given spinal cord phlebitis and risk to life--usually with lumbar issues, the current trend is to treat with ABx - PICC line order placed - plan to continue antibiotics for 6 weeks (day 1 was 12/29)  New onset Afib-CHad2Vasc2 score=3 - heparin gtt --->Xarelto  - previously on Cardizem GTT - transitioned to Cardizem 240mg  PO daily - if rate increases can consider Metoprolol 12.5 bid but rate currently controlled  Probable AECHF - Echo from [place] showed some diastolic HF - chest  x-ray 12/28=Pulm venous congestion - Cr stable this am - potassium WNL - repeat BMP in am   ?1 view CXR=PNA - IS q4 with RN - Current antibiotic coverage should be able to control any pulmonary infection - continue to monitor  Lumbago - no pain reported today  Foley - discontinue orders placed  OHSS - Needs OP testing with pulmonology  Metabolic encephalopathy - improved - patient alert and awake   DVT prophylaxis: Heparin/anticoagulated with Eliquis Code Status: Full Family Communication: discussed with Wife who is bedside Disposition Plan:  inpatient   Consultants:   Neurosurgery  IR  ID  Procedures:  CT guided paraspinous aspiration    11/13/16  Antimicrobials:   Vancomycin 12/28>  Ampicillin 12/29> 12/31  Acyclovir  Ceftriaxone 12/27>   Subjective: Patient seen and evaluated.  He is in a good mood and is hopeful to get PICC line placed today.  Wife and patient have many questions about taking care of PICC line at home.  Objective: Vitals:   11/15/16 0450 11/15/16 1404 11/15/16 2118 11/16/16 0440  BP: (!) 150/78 129/66 133/63 (!) 138/54  Pulse: 81 83 83 80  Resp: 15 18 18 18   Temp: 98 F (36.7 C) 97.7 F (36.5 C) 97.5 F (36.4 C) 97.5 F (36.4 C)  TempSrc: Oral Oral Oral Axillary  SpO2: 95% 93% 94% (!) 84%  Weight:      Height:        Intake/Output Summary (Last 24 hours) at 11/16/16 0847 Last data filed at 11/16/16 0700  Gross per 24 hour  Intake              120 ml  Output  2300 ml  Net            -2180 ml   Filed Weights   11/11/16 1747  Weight: 104.9 kg (231 lb 4.8 oz)    Examination:  General exam: Appears calm and comfortable  Respiratory system: Clear to auscultation. Respiratory effort normal. Cardiovascular system: S1 & S2 heard, RRR. No JVD, murmurs, rubs, gallops or clicks. No pedal edema. Gastrointestinal system: Abdomen is nondistended, soft and nontender. No organomegaly or masses felt. Normal  bowel sounds heard. Central nervous system: Alert and oriented. No focal neurological deficits. Extremities: Symmetric 5 x 5 power. Skin: No rashes, lesions or ulcers Psychiatry: Judgement and insight appear normal. Mood & affect appropriate.     Data Reviewed: I have personally reviewed following labs and imaging studies  CBC:  Recent Labs Lab 11/11/16 1951 11/12/16 0201 11/12/16 1651 11/14/16 0212 11/15/16 0306  WBC 16.3* 16.4* 16.0* 14.2* 14.2*  NEUTROABS  --   --   --   --  11.9*  HGB 10.2* 10.3* 10.1* 10.9* 11.2*  HCT 32.5* 32.7* 32.2* 34.0* 35.0*  MCV 90.3 90.3 89.9 89.2 89.7  PLT 207 209 234 342 377   Basic Metabolic Panel:  Recent Labs Lab 11/11/16 1951 11/12/16 0201 11/13/16 0147 11/14/16 0212 11/15/16 0306  NA  --  134* 134* 137 138  K  --  3.8 4.0 3.0* 3.2*  CL  --  99* 96* 93* 92*  CO2  --  26 26 32 34*  GLUCOSE  --  135* 126* 119* 124*  BUN  --  19 18 19  27*  CREATININE 1.12 1.17 1.27* 1.26* 1.43*  CALCIUM  --  7.9* 8.0* 8.3* 8.4*  MG  --   --   --   --  2.0   GFR: Estimated Creatinine Clearance: 43.3 mL/min (by C-G formula based on SCr of 1.43 mg/dL (H)). Liver Function Tests:  Recent Labs Lab 11/12/16 0201 11/13/16 0147 11/14/16 0212 11/15/16 0306  AST 27 35 30 35  ALT 32 31 30 34  ALKPHOS 73 79 77 74  BILITOT 0.4 1.0 0.4 0.9  PROT 5.4* 5.5* 5.5* 5.6*  ALBUMIN 2.4* 2.4* 2.2* 2.3*   No results for input(s): LIPASE, AMYLASE in the last 168 hours. No results for input(s): AMMONIA in the last 168 hours. Coagulation Profile:  Recent Labs Lab 11/12/16 1605  INR 1.08   Cardiac Enzymes: No results for input(s): CKTOTAL, CKMB, CKMBINDEX, TROPONINI in the last 168 hours. BNP (last 3 results) No results for input(s): PROBNP in the last 8760 hours. HbA1C: No results for input(s): HGBA1C in the last 72 hours. CBG: No results for input(s): GLUCAP in the last 168 hours. Lipid Profile: No results for input(s): CHOL, HDL, LDLCALC, TRIG,  CHOLHDL, LDLDIRECT in the last 72 hours. Thyroid Function Tests: No results for input(s): TSH, T4TOTAL, FREET4, T3FREE, THYROIDAB in the last 72 hours. Anemia Panel: No results for input(s): VITAMINB12, FOLATE, FERRITIN, TIBC, IRON, RETICCTPCT in the last 72 hours. Sepsis Labs: No results for input(s): PROCALCITON, LATICACIDVEN in the last 168 hours.  Recent Results (from the past 240 hour(s))  Culture, blood (Routine X 2) w Reflex to ID Panel     Status: None (Preliminary result)   Collection Time: 11/12/16  6:36 PM  Result Value Ref Range Status   Specimen Description BLOOD LEFT HAND  Final   Special Requests BOTTLES DRAWN AEROBIC AND ANAEROBIC 5CC  Final   Culture NO GROWTH 3 DAYS  Final   Report  Status PENDING  Incomplete  Aerobic/Anaerobic Culture (surgical/deep wound)     Status: None (Preliminary result)   Collection Time: 11/13/16 12:57 PM  Result Value Ref Range Status   Specimen Description ABSCESS  Final   Special Requests RIGHT LUMBOSACRAL PARASPINAL ASPIRATION IN SYRINGE  Final   Gram Stain   Final    ABUNDANT WBC PRESENT, PREDOMINANTLY PMN NO ORGANISMS SEEN    Culture   Final    NO GROWTH 2 DAYS NO ANAEROBES ISOLATED; CULTURE IN PROGRESS FOR 5 DAYS   Report Status PENDING  Incomplete         Radiology Studies: No results found.      Scheduled Meds: . cefTRIAXone (ROCEPHIN)  IV  2 g Intravenous Q12H  . diltiazem  240 mg Oral Daily  . furosemide  40 mg Oral Daily  . loratadine  10 mg Oral Daily  . potassium chloride  40 mEq Oral BID  . pravastatin  40 mg Oral Daily  . rivaroxaban  20 mg Oral Q supper  . traMADol  50 mg Oral Q6H  . vancomycin  1,250 mg Intravenous Q24H   Continuous Infusions:   LOS: 5 days    Time spent: 30 minutes    Katrinka BlazingAlex U Kadolph, MD [facility] Pager [phone]  If 7PM-7AM, please contact night-coverage [email or web address] Password [email or web address] 11/16/2016, 8:47 AM

## 2016-11-16 DEATH — deceased

## 2016-11-17 ENCOUNTER — Encounter (HOSPITAL_COMMUNITY): Payer: Self-pay | Admitting: General Practice

## 2016-11-17 ENCOUNTER — Inpatient Hospital Stay (HOSPITAL_COMMUNITY): Payer: Medicare Other

## 2016-11-17 ENCOUNTER — Other Ambulatory Visit (HOSPITAL_COMMUNITY): Payer: Self-pay

## 2016-11-17 DIAGNOSIS — J189 Pneumonia, unspecified organism: Secondary | ICD-10-CM

## 2016-11-17 DIAGNOSIS — Z9889 Other specified postprocedural states: Secondary | ICD-10-CM

## 2016-11-17 DIAGNOSIS — M4647 Discitis, unspecified, lumbosacral region: Secondary | ICD-10-CM

## 2016-11-17 DIAGNOSIS — Z87891 Personal history of nicotine dependence: Secondary | ICD-10-CM

## 2016-11-17 DIAGNOSIS — R938 Abnormal findings on diagnostic imaging of other specified body structures: Secondary | ICD-10-CM

## 2016-11-17 DIAGNOSIS — R9389 Abnormal findings on diagnostic imaging of other specified body structures: Secondary | ICD-10-CM

## 2016-11-17 DIAGNOSIS — G039 Meningitis, unspecified: Secondary | ICD-10-CM

## 2016-11-17 DIAGNOSIS — M4640 Discitis, unspecified, site unspecified: Secondary | ICD-10-CM

## 2016-11-17 DIAGNOSIS — G061 Intraspinal abscess and granuloma: Secondary | ICD-10-CM | POA: Diagnosis present

## 2016-11-17 LAB — BASIC METABOLIC PANEL
Anion gap: 9 (ref 5–15)
BUN: 29 mg/dL — AB (ref 6–20)
CHLORIDE: 94 mmol/L — AB (ref 101–111)
CO2: 34 mmol/L — AB (ref 22–32)
CREATININE: 1.35 mg/dL — AB (ref 0.61–1.24)
Calcium: 8.5 mg/dL — ABNORMAL LOW (ref 8.9–10.3)
GFR calc Af Amer: 52 mL/min — ABNORMAL LOW (ref >60)
GFR calc non Af Amer: 45 mL/min — ABNORMAL LOW (ref >60)
GLUCOSE: 123 mg/dL — AB (ref 65–99)
POTASSIUM: 3.8 mmol/L (ref 3.5–5.1)
Sodium: 137 mmol/L (ref 135–145)

## 2016-11-17 LAB — CULTURE, BLOOD (ROUTINE X 2): CULTURE: NO GROWTH

## 2016-11-17 MED ORDER — POTASSIUM CHLORIDE 20 MEQ/15ML (10%) PO SOLN
40.0000 meq | Freq: Two times a day (BID) | ORAL | Status: DC
  Administered 2016-11-17: 40 meq via ORAL
  Filled 2016-11-17 (×3): qty 30

## 2016-11-17 MED ORDER — FUROSEMIDE 40 MG PO TABS: 40.0000 mg | ORAL_TABLET | Freq: Every day | ORAL | 0 refills | Status: DC

## 2016-11-17 MED ORDER — VANCOMYCIN IV (FOR PTA / DISCHARGE USE ONLY): 1250.0000 mg | INTRAVENOUS | 0 refills | Status: DC

## 2016-11-17 MED ORDER — VANCOMYCIN HCL 10 G IV SOLR: 1250.0000 mg | INTRAVENOUS | Status: DC

## 2016-11-17 MED ORDER — CEFTRIAXONE IV (FOR PTA / DISCHARGE USE ONLY): 2.0000 g | Freq: Two times a day (BID) | INTRAVENOUS | 0 refills | Status: DC

## 2016-11-17 MED ORDER — SODIUM CHLORIDE 0.9% FLUSH: 10.0000 mL | INTRAVENOUS | Status: DC | PRN

## 2016-11-17 MED ORDER — DILTIAZEM HCL ER COATED BEADS 240 MG PO CP24: 240.0000 mg | ORAL_CAPSULE | Freq: Every day | ORAL | 0 refills | Status: AC

## 2016-11-17 MED ORDER — CEFTRIAXONE SODIUM 2 G IJ SOLR: 2.0000 g | Freq: Two times a day (BID) | INTRAMUSCULAR | Status: DC

## 2016-11-17 MED ORDER — RIVAROXABAN 20 MG PO TABS: 20.0000 mg | ORAL_TABLET | Freq: Every day | ORAL | 0 refills | Status: DC

## 2016-11-17 NOTE — Progress Notes (Signed)
[place] for Infectious Disease  Date of Admission:  11/11/2016   Total days of antibiotics 10          Principal Problem:   Abscess in epidural space of lumbar spine Active Problems:   Meningitis   Osteoarthritis of right knee   A-fib [facility]), new onset [facility] hospital 12/25   Obesity hypoventilation syndrome ([place]), presumed   UTI (urinary tract infection)   Sepsis ([place])   . cefTRIAXone (ROCEPHIN)  IV  2 g Intravenous Q12H  . diltiazem  240 mg Oral Daily  . furosemide  40 mg Oral Daily  . loratadine  10 mg Oral Daily  . potassium chloride  40 mEq Oral BID  . pravastatin  40 mg Oral Daily  . rivaroxaban  20 mg Oral Q supper  . traMADol  50 mg Oral Q6H  . vancomycin  1,250 mg Intravenous Q24H    SUBJECTIVE: He tells me that he is feeling much better. His back pain has improved significantly. He is no longer having problems finding his words. His wife notes that his confusion is nearly completely resolved. He had been having worsening back pain for about 3 weeks before undergoing a lumbar injection on 11/05/2016.  Review of Systems: Review of Systems  Constitutional: Negative for chills, diaphoresis, fever, malaise/fatigue and weight loss.  HENT: Negative for sore throat.   Respiratory: Negative for cough, sputum production and shortness of breath.   Cardiovascular: Negative for chest pain.  Gastrointestinal: Negative for abdominal pain, diarrhea, heartburn, nausea and vomiting.  Genitourinary: Negative for dysuria and frequency.  Musculoskeletal: Positive for back pain. Negative for joint pain and myalgias.  Skin: Negative for rash.  Neurological: Negative for dizziness, sensory change, speech change, focal weakness and headaches.    Past Medical History:  Diagnosis Date  . Arthritis    R knee  . Hyperlipidemia   . Hypertension    Dr Bettina Gavia in [place] is pt's cardiologist.  . Ulcer ([place])    stomach 60 years ago- no current problem    Social  History  Substance Use Topics  . Smoking status: Former Smoker    Years: 6.00  . Smokeless tobacco: Former Systems developer    Quit date: 11/16/1950  . Alcohol use 3.6 oz/week    6 Glasses of wine per week    Family History  Problem Relation Age of Onset  . Anesthesia problems Neg Hx    No Known Allergies  OBJECTIVE: Vitals:   11/16/16 1451 11/16/16 2003 11/17/16 0431 11/17/16 1005  BP: 125/73 126/72 (!) 149/81 (!) 143/97  Pulse: 84 97 90   Resp: '18 18 20 20  ' Temp: 98.5 F (36.9 C) 98 F (36.7 C) 97.8 F (36.6 C) 97.9 F (36.6 C)  TempSrc: Oral Oral Oral Oral  SpO2: 96% 96% 96% 100%  Weight:      Height:       Body mass index is 33.19 kg/m.  Physical Exam  Constitutional: He is oriented to person, place, and time.  He is alert, talkative and in good spirits. He is sitting up in a chair.  HENT:  Mouth/Throat: No oropharyngeal exudate.  Eyes: Conjunctivae are normal.  Cardiovascular: Normal rate and regular rhythm.   No murmur heard. Pulmonary/Chest: Effort normal and breath sounds normal. He has no wheezes. He has no rales.  Abdominal: Soft. He exhibits no mass. There is no tenderness.  Musculoskeletal: Normal range of motion.  Neurological: He is alert and  oriented to person, place, and time.  Skin: No rash noted.  Psychiatric: Mood and affect normal.    Lab Results Lab Results  Component Value Date   WBC 14.2 (H) 11/15/2016   HGB 11.2 (L) 11/15/2016   HCT 35.0 (L) 11/15/2016   MCV 89.7 11/15/2016   PLT 377 11/15/2016    Lab Results  Component Value Date   CREATININE 1.35 (H) 11/17/2016   BUN 29 (H) 11/17/2016   NA 137 11/17/2016   K 3.8 11/17/2016   CL 94 (L) 11/17/2016   CO2 34 (H) 11/17/2016    Lab Results  Component Value Date   ALT 34 11/15/2016   AST 35 11/15/2016   ALKPHOS 74 11/15/2016   BILITOT 0.9 11/15/2016     Microbiology: Recent Results (from the past 240 hour(s))  Culture, blood (Routine X 2) w Reflex to ID Panel     Status: None  (Preliminary result)   Collection Time: 11/12/16  6:36 PM  Result Value Ref Range Status   Specimen Description BLOOD LEFT HAND  Final   Special Requests BOTTLES DRAWN AEROBIC AND ANAEROBIC 5CC  Final   Culture NO GROWTH 4 DAYS  Final   Report Status PENDING  Incomplete  Aerobic/Anaerobic Culture (surgical/deep wound)     Status: None (Preliminary result)   Collection Time: 11/13/16 12:57 PM  Result Value Ref Range Status   Specimen Description ABSCESS  Final   Special Requests RIGHT LUMBOSACRAL PARASPINAL ASPIRATION IN SYRINGE  Final   Gram Stain   Final    ABUNDANT WBC PRESENT, PREDOMINANTLY PMN NO ORGANISMS SEEN    Culture   Final    NO GROWTH 2 DAYS NO ANAEROBES ISOLATED; CULTURE IN PROGRESS FOR 5 DAYS   Report Status PENDING  Incomplete     ASSESSMENT: He has a large lumbar epidural and paraspinal abscess complicated by meningitis. Blood and CSF cultures done at [facility] prior to transfer here are negative. Repeat blood cultures and lumbar aspiration stain and cultures done here are negative. Apparently there was no evidence of endocarditis on a transthoracic echocardiogram done at [facility]. He has no symptoms to suggest pneumonia. I suspect that his recent worsening back pain was due to an unrecognized lumbar infection and unrelated to the injection he got a few days before hospitalization. He is improving on current therapy. I recommend PICC placement and a minimum of 6 weeks of IV antibiotic therapy. His creatinine has come down so I would continue vancomycin and ceftriaxone for now. Although he did have evidence of meningitis he does not need to be on droplet precautions at this time.  PLAN: 1. Continue IV vancomycin and ceftriaxone for 6 weeks minimum 2. PICC placement 3. Discontinue droplet precautions 4. Discharge home soon with follow-up in our clinic which I will arrange  Diagnosis: Lumbar epidural abscess  Culture Result: Culture negative  No Known  Allergies  Discharge antibiotics: Per pharmacy protocol IV vancomycin and ceftriaxone Aim for Vancomycin trough 15-20 (unless otherwise indicated) Duration: Minimum of 6 weeks End Date: 12/21/2016  [facility] Care Per Protocol:  Labs weekly while on IV antibiotics: _x_ CBC with differential _x_ BMP __ CMP _x_ CRP _x_ ESR _x_ Vancomycin trough  __ Please pull PIC at completion of IV antibiotics _x_ Please leave PIC in place until doctor has seen patient or been notified  Fax weekly labs to [phone]  Clinic Follow Up Appt: 1 month @ [place], [place] for Infectious Disease  [place] [phone] pager   [phone] cell 11/17/2016, 10:37 AM

## 2016-11-17 NOTE — Progress Notes (Signed)
[place] pt for [facility] this hospital admission.  [facility] will provide [facility], PT and Home Infusion Pharmacy team for home IV ABX.  [facility] hospital Infusion Coordinator will provide in hospital teaching with caregiver for home IV ABX to support independence at home upon DC.  [facility] hospital team will follow pt until DC to support transition  home and ensure [facility] needs are met.   If patient discharges after hours, please call [phone].   Larry Sierras 11/17/2016, 5:24 PM

## 2016-11-17 NOTE — Progress Notes (Signed)
Physical Therapy Treatment Patient Details Name: Rishawn Walck MRN: [id number] DOB: [birth year] Today's Date: 11/17/2016    History of Present Illness 83 yo admitted to [facility] 12/23 with encephalopathy after epidural injection 12/20, new Afib 12/25, transfer to [facility] 12/27 with epidural abscess s/p aspiration. PMHx: Rt TKA, HLD, HTN, low back pain    PT Comments    Pt progressing with gait and transfers today with bil lift shoes donned for gait. Pt continues to demonstrate flexed trunk and decreased endurance with gait. Educated for mobility and HEP with daily mobility encouraged. Will continue to follow.    Follow Up Recommendations  Home health PT     Equipment Recommendations       Recommendations for Other Services       Precautions / Restrictions Precautions Precautions: Fall Required Braces or Orthoses: Other Brace/Splint Other Brace/Splint: bil lift shoes    Mobility  Bed Mobility Overal bed mobility: Modified Independent             General bed mobility comments: with use of rail  Transfers Overall transfer level: Needs assistance   Transfers: Sit to/from Stand Sit to Stand: Supervision         General transfer comment: cues for hand placement, x 2 trials from bed  Ambulation/Gait Ambulation/Gait assistance: Min assist Ambulation Distance (Feet): 130 Feet Assistive device: Rolling walker (2 wheeled) Gait Pattern/deviations: Step-through pattern;Decreased stride length;Trunk flexed   Gait velocity interpretation: Below normal speed for age/gender General Gait Details: cues for posture, position in RW and min assist at times to direct RW since pt used to rollator with swivel wheels   Stairs            Wheelchair Mobility    Modified Rankin (Stroke Patients Only)       Balance Overall balance assessment: Needs assistance   Sitting balance-Leahy Scale: Good       Standing balance-Leahy Scale: Fair                       Cognition Arousal/Alertness: Awake/alert Behavior During Therapy: WFL for tasks assessed/performed Overall Cognitive Status: Within Functional Limits for tasks assessed                      Exercises General Exercises - Lower Extremity Long Arc Quad: AROM;Both;Seated;20 reps Hip Flexion/Marching: AROM;Both;Seated;20 reps    General Comments        Pertinent Vitals/Pain Pain Assessment: No/denies pain    Home Living                      Prior Function            PT Goals (current goals can now be found in the care plan section) Progress towards PT goals: Progressing toward goals    Frequency           PT Plan Current plan remains appropriate    Co-evaluation             End of Session Equipment Utilized During Treatment: Gait belt Activity Tolerance: Patient tolerated treatment well Patient left: in chair;with call bell/phone within reach;with family/visitor present     Time: 4098-1191 PT Time Calculation (min) (ACUTE ONLY): 24 min  Charges:  $Gait Training: 8-22 mins $Therapeutic Exercise: 8-22 mins                    G Codes:      Aleeah Greeno B Neveyah Garzon  11/17/2016, 10:07 AM Delaney MeigsMaija Tabor Jalani Rominger, PT [phone]

## 2016-11-17 NOTE — Care Management Note (Signed)
Case Management Note  Patient Details  Name: Andrew Salas MRN: [id number] Date of Birth: [birth year]  Subjective/Objective: Pt admitted on 11/11/16 with epidural and paraspinal abscesses, new onset Afib, probable CHF, and ? PNA.  PTA, pt resided at home with his "companion", at bedside.                     Action/Plan: Pt will need 6 weeks IV antibiotic therapy to treat infection.  PICC line placed today.  Explained HH care with antibiotic therapy.  Companion willing and able to be taught to administer antibiotics.  Pt states he has used [facility] in the past, and wishes to use again.  Referral [facility] Jer[facility]Pam Chandler [facility].  Teaching to follow.    Expected Discharge Date:                  Expected Discharge Plan:  Home w Home Health Services  In-House Referral:     Discharge planning Services  CM Consult  Post Acute Care Choice:    Choice offered to:  Patient  DME Arranged:    DME Agency:     HH Arranged:  PT, RN HH Agency:  [facility]  Status of Service:  In process, will continue to follow  If discussed at Long Length of Stay Meetings, dates discussed:    Additional Comments:  Pt to dc on Xarelto for Afib; 30 day free trial card given to pt.  Checking drug coverage; will document when available.    Glennon MacAmerson, Donaldson Richter M, RN 11/17/2016, 4:56 PM [phone]

## 2016-11-17 NOTE — Progress Notes (Signed)
Peripherally Inserted Central Catheter/Midline Placement  The IV Nurse has discussed with the patient and/or persons authorized to consent for the patient, the purpose of this procedure and the potential benefits and risks involved with this procedure.  The benefits include less needle sticks, lab draws from the catheter, and the patient may be discharged home with the catheter. Risks include, but not limited to, infection, bleeding, blood clot (thrombus formation), and puncture of an artery; nerve damage and irregular heartbeat and possibility to perform a PICC exchange if needed/ordered by physician.  Alternatives to this procedure were also discussed.  Bard Power PICC patient education guide, fact sheet on infection prevention and patient information card has been provided to patient /or left at bedside.    PICC/Midline Placement Documentation        Lisabeth DevoidGibbs, Tequila Rottmann Jeanette 11/17/2016, 1:33 PM Consent obtained by Gasper LloydKerry Cothren, RN

## 2016-11-18 ENCOUNTER — Other Ambulatory Visit (HOSPITAL_COMMUNITY): Payer: Self-pay

## 2016-11-18 DIAGNOSIS — Z96659 Presence of unspecified artificial knee joint: Secondary | ICD-10-CM | POA: Diagnosis not present

## 2016-11-18 DIAGNOSIS — J181 Lobar pneumonia, unspecified organism: Secondary | ICD-10-CM

## 2016-11-18 DIAGNOSIS — G061 Intraspinal abscess and granuloma: Secondary | ICD-10-CM | POA: Diagnosis not present

## 2016-11-18 DIAGNOSIS — M179 Osteoarthritis of knee, unspecified: Secondary | ICD-10-CM | POA: Diagnosis not present

## 2016-11-18 DIAGNOSIS — G9340 Encephalopathy, unspecified: Secondary | ICD-10-CM

## 2016-11-18 LAB — AEROBIC/ANAEROBIC CULTURE (SURGICAL/DEEP WOUND)

## 2016-11-18 LAB — BASIC METABOLIC PANEL
ANION GAP: 8 (ref 5–15)
BUN: 25 mg/dL — AB (ref 6–20)
CO2: 32 mmol/L (ref 22–32)
Calcium: 8.4 mg/dL — ABNORMAL LOW (ref 8.9–10.3)
Chloride: 97 mmol/L — ABNORMAL LOW (ref 101–111)
Creatinine, Ser: 1.43 mg/dL — ABNORMAL HIGH (ref 0.61–1.24)
GFR calc Af Amer: 49 mL/min — ABNORMAL LOW (ref >60)
GFR calc non Af Amer: 42 mL/min — ABNORMAL LOW (ref >60)
GLUCOSE: 118 mg/dL — AB (ref 65–99)
POTASSIUM: 4.3 mmol/L (ref 3.5–5.1)
Sodium: 137 mmol/L (ref 135–145)

## 2016-11-18 LAB — CBC WITH DIFFERENTIAL/PLATELET
Basophils Absolute: 0 10*3/uL (ref 0.0–0.1)
Basophils Relative: 0 %
EOS PCT: 1 %
Eosinophils Absolute: 0.2 10*3/uL (ref 0.0–0.7)
HEMATOCRIT: 31.9 % — AB (ref 39.0–52.0)
Hemoglobin: 10.1 g/dL — ABNORMAL LOW (ref 13.0–17.0)
LYMPHS PCT: 6 %
Lymphs Abs: 1.1 10*3/uL (ref 0.7–4.0)
MCH: 28.9 pg (ref 26.0–34.0)
MCHC: 31.7 g/dL (ref 30.0–36.0)
MCV: 91.1 fL (ref 78.0–100.0)
MONOS PCT: 6 %
Monocytes Absolute: 1 10*3/uL (ref 0.1–1.0)
NEUTROS ABS: 14.7 10*3/uL — AB (ref 1.7–7.7)
Neutrophils Relative %: 87 %
PLATELETS: 379 10*3/uL (ref 150–400)
RBC: 3.5 MIL/uL — ABNORMAL LOW (ref 4.22–5.81)
RDW: 14.4 % (ref 11.5–15.5)
WBC: 16.9 10*3/uL — ABNORMAL HIGH (ref 4.0–10.5)

## 2016-11-18 LAB — AEROBIC/ANAEROBIC CULTURE W GRAM STAIN (SURGICAL/DEEP WOUND): Culture: NO GROWTH

## 2016-11-18 NOTE — Progress Notes (Signed)
ANTICOAGULATION CONSULT NOTE  Pharmacy Consult for Xarelto Indication: atrial fibrillation   Assessment: 89 yom continuing on Xarelto for afib. Renal function is borderline, but dose is ok using actual body weight for CrCl~52. Hg down 10.1, plt wnl, no bleed documented.  Goal of Therapy:  Stroke prevention Monitor platelets by anticoagulation protocol: Yes   Plan:  Xarelto 20mg  PO Qsupper Monitor CBC, s/sx bleeding   Babs BertinHaley Kemonte Ullman, PharmD, BCPS Clinical Pharmacist 11/18/2016 10:41 AM

## 2016-11-18 NOTE — Care Management Note (Signed)
Case Management Note Original Note Created Andrew Salas, Andrew M, RN 11/17/2016, 4:56 PM    Patient Details  Name: Andrew Salas MRN: [id number] Date of Birth: July 15, 1926  Subjective/Objective: Pt admitted on 11/11/16 with epidural and paraspinal abscesses, new onset Afib, probable CHF, and ? PNA.  PTA, pt resided at home with his "companion", at bedside.                     Action/Plan: Pt will need 6 weeks IV antibiotic therapy to treat infection.  PICC line placed today.  Explained HH care with antibiotic therapy.  Companion willing and able to be taught to administer antibiotics.  Pt states he has used [facility] in the past, and wishes to use again.  Referral [facility]Pam Chandler [facility].  Teaching to follow.    Expected Discharge Date:     11/18/16             Expected Discharge Plan:  Home w Home Health Services  In-House Referral:     Discharge planning Services  CM Consult, Medication Assistance  Post Acute Care Choice:  Home Health Choice offered to:  Patient  DME Arranged:    DME Agency:     HH Arranged:  PT, RN, Nurse's Aide, IV Antibiotics HH Agency:  [facility]  Status of Service:  Completed, signed off  If discussed at Long Length of Stay Meetings, dates discussed:    Additional Comments:   11/18/16- 1200- Andrew Scarpino RN, CM-  Pt for d/c home today- has Xarelto 30 day free card and copay is $45/mo- have spoken with both Clydie BraunKaren and Pam [facility] and [facility]x   Andrew Salas, Andrew M, RN 11/17/2016, 4:56 PM -- Pt to dc on Xarelto for Afib; 30 day free trial card given to pt.  Checking drug coverage; will document when available.    Zenda AlpersWebster, Tierras Nuevas PonienteKristi Hall, RN 11/18/2016, 12:11 PM [phone]

## 2016-11-18 NOTE — Discharge Summary (Signed)
Physician Discharge Summary  Andrew Salas YIR:[id number] DOB: Sep 03, 1926 DOA: 11/11/2016  PCP: Nicoletta Dress, MD  Admit date: 11/11/2016 Discharge date: 11/18/2016  Recommendations for Outpatient Follow-up:  Discharge antibiotics: Per pharmacy protocol IV vancomycin and ceftriaxone - through 12/22/2015 Aim for Vancomycin trough 15-20 (unless otherwise indicated) Duration: Minimum of 6 weeks End Date: 12/21/2016  [facility] Care Per Protocol:  Labs weekly while on IV antibiotics: _x_ CBC with differential _x_ BMP __ CMP _x_ CRP _x_ ESR _x_ Vancomycin trough  __ Please pull PIC at completion of IV antibiotics _x_ Please leave PIC in place until doctor has seen patient or been notified  Fax weekly labs to [phone]   Please hold lasix until Cr value stable. Current Cr is 1.43 which was also seen few days ago. Since patient is on vancomycin, lasix may exacerbate renal failure so would prefer to hold it until we know Cr remains at stable values. Since patient will have home health RN considering antibiotic infusions at home please note above we have BNP check weekly.  Home infusion instructions [facility] May follow [place] Dosing Protocol; May administer Cathflo as needed to maintain patency of vascular access device.; Flushing of vascular access device: per [facility] Protocol: 0.9% NaCl pre/post medica...    Complete by:  As directed   Instructions:  May follow [place] Dosing Protocol  Instructions:  May administer Cathflo as needed to maintain patency of vascular access device.  Instructions:  Flushing of vascular access device: per [facility] Protocol: 0.9% NaCl pre/post medication administration and prn patency; Heparin 100 u/ml, 4m for implanted ports and Heparin 10u/ml, 528mfor all other central venous catheters.  Instructions:  May follow [facility] Anaphylaxis Protocol for First Dose Administration in the home: 0.9% NaCl at 25-50 ml/hr to maintain IV access for  protocol meds. Epinephrine 0.3 ml IV/IM PRN and Benadryl 25-50 IV/IM PRN s/s of anaphylaxis.  Instructions:  [facility]fusion Coordinator (RN) to assist per patient IV care needs in the home PRN.    Discharge Diagnoses:  Principal Problem:   Abscess in epidural space of lumbar spine Active Problems:   Osteoarthritis of right knee   Meningitis   [facility]   Obesity hypoventilation syndrome ([place] presumed   UTI (urinary tract infection)   Sepsis ([facility]  Abnormal CXR   Discitis   PNA (pneumonia)   Discharge Condition: stable; Patient wants to go home today; Spoke with patient's wife at the bedside, home health orders all placed including home health nurse who can help with antibiotic infusion   Diet recommendation: as tolerated   History of present illness:   Per brief narrative 11/18/2015 "83 year old male with a known history of hypertension, chronic low back pain, hypercholesterolemia and prior right knee arthroplasty presented with back pain on 11/11/16.  He underwent lumbosacral transforaminal epidural steroid injection 11/04/16 with lidocaine and Depo Medrol 80 on 11/04/2016. Dr[facility]. Symptoms are waxing and waning. Also admitted for shortness of breath and dyspneic and ultimately had LP which was concerning for [facility] and erector muscle abcesses IR and ID consulted and was neurosurgery.  Decision to treat with antibiotics was made."   Hospital Course:   Epidural and paraspinal abceses / Leukocytosis  - IR to obtain sample of representative collection for culture on 12/29 - Cont Vanc/ceftriaxone through 2/5  per ID recommendations  - PICC in place   New onset Afib - CHad2Vasc2 score=3 - Continue anticoagulation with Xarelto  - Rate controlled with  Cardizem - HR 85  Chronic diastolic congestive heart failure - Echo from [place] showed some diastolic HF - Chest x-ray 12/28=Pulm venous congestion - Because creatinine is 1.4, would prefer that patient hold Lasix until we note his creatinine remains stable while patient on vancomycin for epidural abscess. Both vancomycin and Lasix are nephrotoxic. Since patient requires vancomycin for epidural abscess it is reasonable to continue vancomycin and hold Lasix as noted until we repeat creatinine in 1 week and make sure that it remains stable in at least 1.4 range. - Respiratory status is stable this morning  Dyslipidemia - Continue Pravachol  Essential hypertension - Continue Cardizem and benazepril   Lobar pneumonia - Patient already on antibiotics as indicated above for epidural abscess. Those antibiotics will cover for pneumonia.  Lumbago - Pain controlled  Foley - Discontinued  OHSS - Needs OP testing with pulmonology  Acute metabolic encephalopathy - Patient is alert and oriented to time, place and person   DVT prophylaxis: Xarelto Code Status:Full Family Communication:Wife who is bedside   Consultants:   Neurosurgery  IR  ID  Procedures:   CT guided paraspinous aspiration12/29/17  Antimicrobials:   Vancomycin 12/28>  Ampicillin 12/29> 12/31  Acyclovir  Ceftriaxone 12/27>   Signed:  Leisa Lenz, MD  [facility] 11/18/2016, 11:35 AM  Pager #: [phone]  Time spent in minutes: less than 30 minutes   Discharge Exam: Vitals:   11/17/16 2018 11/18/16 0522  BP: (!) 152/90 (!) 154/92  Pulse: 88 85  Resp: 18 18  Temp: 98.1 F (36.7 C) 97.8 F (36.6 C)   Vitals:   11/17/16 1005 11/17/16 1412 11/17/16 2018 11/18/16 0522  BP: (!) 143/97 131/74 (!) 152/90 (!) 154/92  Pulse:  80 88 85  Resp: '20 18 18 18  ' Temp: 97.9 F (36.6 C) 98.7 F (37.1 C) 98.1 F (36.7 C) 97.8 F (36.6 C)  TempSrc: Oral Oral Oral Oral   SpO2: 100% 93% 94% 94%  Weight:      Height:        General: Pt is alert, follows commands appropriately, not in acute distress Cardiovascular: Regular rate and rhythm, S1/S2 + Respiratory: Clear to auscultation bilaterally, no wheezing, no crackles, no rhonchi Abdominal: Soft, non tender, non distended, bowel sounds +, no guarding Extremities: no cyanosis, pulses palpable bilaterally DP and PT Neuro: Grossly nonfocal  Discharge Instructions  Discharge Instructions    Call MD for:  persistant nausea and vomiting    Complete by:  As directed    Call MD for:  redness, tenderness, or signs of infection (pain, swelling, redness, odor or green/yellow discharge around incision site)    Complete by:  As directed    Call MD for:  severe uncontrolled pain    Complete by:  As directed    Diet - low sodium heart healthy    Complete by:  As directed    Discharge instructions    Complete by:  As directed    Discharge antibiotics: Per pharmacy protocol IV vancomycin and ceftriaxone Aim for Vancomycin trough 15-20 (unless otherwise indicated) Duration: Minimum of 6 weeks End Date: 12/21/2016  [facility] Care Per Protocol:  Labs weekly while on IV antibiotics: _x_ CBC with differential _x_ BMP __ CMP _x_ CRP _x_ ESR _x_ Vancomycin trough  __ Please pull PIC at completion of IV antibiotics _x_  Please leave PIC in place until doctor has seen patient or been notified  Fax weekly labs to [phone]   Please hold lasix until Cr value stable. Current Cr is 1.43 which was also seen few days ago. Since patient is on vancomycin, lasix may exacerbate renal failure so would prefer to hold it until we know Cr remains at stable values. Since patient will have home health RN considering antibiotic infusions at home please note above we have BNP check weekly.   Home infusion instructions [facility] May follow [place] Dosing Protocol; May administer Cathflo as needed to maintain  patency of vascular access device.; Flushing of vascular access device: per [facility] Protocol: 0.9% NaCl pre/post medica...    Complete by:  As directed    Instructions:  May follow [place] Dosing Protocol   Instructions:  May administer Cathflo as needed to maintain patency of vascular access device.   Instructions:  Flushing of vascular access device: per [facility] Protocol: 0.9% NaCl pre/post medication administration and prn patency; Heparin 100 u/ml, 47m for implanted ports and Heparin 10u/ml, 579mfor all other central venous catheters.   Instructions:  May follow [facility] Anaphylaxis Protocol for First Dose Administration in the home: 0.9% NaCl at 25-50 ml/hr to maintain IV access for protocol meds. Epinephrine 0.3 ml IV/IM PRN and Benadryl 25-50 IV/IM PRN s/s of anaphylaxis.   Instructions:  Ad[facility] Farm[facility]enfusion Coordinator (RN) to assist per patient IV care needs in the home PRN.   Increase activity slowly    Complete by:  As directed      Allergies as of 11/18/2016   No Known Allergies     Medication List    TAKE these medications   benazepril 40 MG tablet Commonly known as:  LOTENSIN Take 40 mg by mouth daily.   carboxymethylcellulose 0.5 % Soln Commonly known as:  REFRESH PLUS Place 1 drop into both eyes 3 (three) times daily as needed (for dryness).   cefTRIAXone 2 g in dextrose 5 % 50 mL Inject 2 g into the vein every 12 (twelve) hours.   cefTRIAXone IVPB Commonly known as:  ROCEPHIN Inject 2 g into the vein every 12 (twelve) hours. Indication:  Lumbar epidural abscess Last Day of Therapy:  12/21/16 Labs - Once weekly:  CBC/D and BMP, Labs - Every other week:  ESR and CRP   diltiazem 240 MG 24 hr capsule Commonly known as:  CARDIZEM CD Take 1 capsule (240 mg total) by mouth daily.   latanoprost 0.005 % ophthalmic solution Commonly known as:  XALATAN Place 1 drop into both eyes at bedtime.   pravastatin 40 MG tablet Commonly known as:  PRAVACHOL Take 40 mg by mouth  daily.   PRESERVISION AREDS 2 PO Take 1 tablet by mouth 2 (two) times daily.   rivaroxaban 20 MG Tabs tablet Commonly known as:  XARELTO Take 1 tablet (20 mg total) by mouth daily with supper.   traMADol 50 MG tablet Commonly known as:  ULTRAM Take 50 mg by mouth 3 (three) times daily as needed for pain.   vancomycin 1,250 mg in sodium chloride 0.9 % 250 mL Inject 1,250 mg into the vein daily.   vancomycin IVPB Inject 1,250 mg into the vein daily. Indication:  Lumbar epidural abscess Last Day of Therapy:  12/21/16 Labs - Sunday/Monday:  CBC/D, BMP, and vancomycin trough. Labs - Thursday:  BMP and vancomycin trough Labs - Every other week:  ESR and CRP  Home Infusion Instuctions        Start     Ordered   11/17/16 0000  Home infusion instructions [facility] May follow [place] Dosing Protocol; May administer Cathflo as needed to maintain patency of vascular access device.; Flushing of vascular access device: per [facility] Protocol: 0.9% NaCl pre/post medica...    Question Answer Comment  Instructions May follow [place] Dosing Protocol   Instructions May administer Cathflo as needed to maintain patency of vascular access device.   Instructions Flushing of vascular access device: per [facility] Protocol: 0.9% NaCl pre/post medication administration and prn patency; Heparin 100 u/ml, 9m for implanted ports and Heparin 10u/ml, 544mfor all other central venous catheters.   Instructions May follow [facility] Anaphylaxis Protocol for First Dose Administration in the home: 0.9% NaCl at 25-50 ml/hr to maintain IV access for protocol meds. Epinephrine 0.3 ml IV/IM PRN and Benadryl 25-50 IV/IM PRN s/s of anaphylaxis.   Instructions [facility] Infusion Coordinator (RN) to assist per patient IV care needs in the home PRN.      11/17/16 1611     Follow-up Information    [facility] up.   Why:  HHRN/PT/aide- (iv abx for  wks) Contact  information: [phone]      SCDXIPJAS,NKNLZJQ, MD. Schedule an appointment as soon as possible for a visit in 1 week(s).   Specialty:  Internal Medicine Contact information: [phone]          The results of significant diagnostics from this hospitalization (including imaging, microbiology, ancillary and laboratory) are listed below for reference.    Significant Diagnostic Studies: Dg Chest 2 View  Result Date: 11/14/2016 CLINICAL DATA:  Shortness of breath.  History of recent pneumonia. EXAM: CHEST  2 VIEW COMPARISON:  11/13/2016 FINDINGS: The heart is enlarged but stable. Stable tortuosity and ectasia of the thoracic aorta. Persistent bibasilar infiltrates or atelectasis and small effusions. IMPRESSION: Persistent bibasilar process and small effusions. Stable cardiac enlargement.  No pulmonary edema. Electronically Signed   By: P.Marijo Sanes.D.   On: 11/14/2016 08:58   Dg Chest 2 View  Result Date: 11/12/2016 CLINICAL DATA:  Discitis. EXAM: CHEST  2 VIEW COMPARISON:  11/06/2016 FINDINGS: Cardiomegaly and large hiatal hernia. Pulmonary vascular congestion and hazy basilar opacity that is likely atelectasis. Posttraumatic deformity of the left chest wall. Chronic upper mediastinal widening from ectatic vasculature based on recent chest CT. IMPRESSION: Cardiomegaly and pulmonary venous congestion. Intrathoracic stomach. Electronically Signed   By: JoMonte Fantasia.D.   On: 11/12/2016 08:02   Mr Lumbar Spine W Wo Contrast  Addendum Date: 11/12/2016   ADDENDUM REPORT: 11/12/2016 13:03 ADDENDUM: Study discussed by telephone with Dr. JANita Sellsn 11/12/2016 At 1244 hours. Electronically Signed   By: H Genevie Ann.D.   On: 11/12/2016 13:03   Result Date: 11/12/2016 CLINICAL DATA:  [age]ear old male who underwent lumbar epidural steroid injection on 11/04/2016. [facility] revealing cloudy CSF with debris suspicious for meningitis. Possible sepsis. Initial encounter. EXAM: MRI LUMBAR SPINE WITHOUT AND WITH CONTRAST TECHNIQUE: Multiplanar and multiecho pulse sequences of the lumbar spine were obtained without and with intravenous contrast. CONTRAST:  2041mULTIHANCE GADOBENATE DIMEGLUMINE 529 MG/ML IV SOLN [facility]  [facility] 11/[facility]16, CT Abdomen and Pelvis 11/08/2016. Lumbar MRI  07/23/2015 FINDINGS: Segmentation: Normal, which is the same numbering system on the 2016 MRI. Alignment: Stable since 2016 with grade 1 anterolisthesis of L4 on L5 and mildly exaggerated lumbar lordosis. Vertebrae: Mild chronic anterior endplate edema at L2 appears degenerative in nature. There is abnormal increased fluid in the bilateral L5-S1 and the right L4-L5 facets, see below. There is no definite posterior element marrow edema. Visible sacrum appears intact. Visible SI joints are within normal limits. Conus medullaris: Extends to the L1 level. There is no abnormal signal in the visible lower thoracic spinal cord or conus. See epidural abnormalities described below. No abnormal intradural enhancement identified. Paraspinal and other soft tissues: Negative visualized abdominal viscera. Diffusely abnormal epidural space in the lumbar spine from the superior L3 to the sacral level. Elongated posterior epidural fluid collection tracking from L4-L5 into the sacrum encompassing 11 x 15 x 75 mm (AP by transverse by CC, for an estimated volume of 6-7 mL). See series 9, image 7, series 3, image 7, series 8, image 29 and series 7, image 35. Mild associated mass effect on the posterior thecal sac. Associated abnormal posterior dural thickening and heterogeneous epidural space enhancement, maximal at L4 (series 9, image 7). There is also a superimposed smaller ventral epidural abscess primarily  to the left of midline at L3 and L4 best seen on series 3, image 7 and series 7 image 21. It is possible there is a superimposed subdural fluid collection tracking as high as the tip of the conus (series 7, image 1) but this is not rim enhancing. There are posterior erector spinae muscle abscesses which are small and clustered and present more so to the right of midline. These are best demonstrated on series 6 images 3 through 6. The cluster of right side muscle abscess encompasses 24 x 22 x 69 mm (AP by transverse by CC, for an estimated volume of 18 mL). See series 9, image 2. This is in association with new right side L4-L5, L5-S1, and left side L5-S1 facet joint fluid suspicious. There is superimposed diffuse lumbar subcutaneous edema. Disc levels: The above epidural abnormality exacerbates multifactorial degenerative lumbar spinal stenosis from L3-L4 to L5-S1. Heterogeneous signal in the L1-L2 disc is chronic and appears degenerative in nature. IMPRESSION: 1. Positive for multifocal Lumbar Epidural and Paraspinal Abscesses from the L3 to the S2 level. - Ventral and dorsal epidural abscesses, most pronounced from L4 to S2. Associated lumbar dural thickening and enhancement. - Right greater than left erector spinae muscle abscesses, encompassing 2 x 2 x 7 cm on the right from the L4 to the S1 level. 2. Suspect associated Septic Facet Joints on the right at L4-L5 and L5-S1, and on the left at L5-S1. 3. No overt lumbosacral osteomyelitis and no discitis suspected at this time. Electronically Signed: By: Genevie Ann M.D. On: 11/12/2016 12:40   Ct Aspiration  Result Date: 11/13/2016 CLINICAL DATA:  Lumbar epidural and paraspinal abscesses. EXAM: CT GUIDED ASPIRATION OF PARASPINAL ABSCESS ANESTHESIA/SEDATION: 1.0 mg IV Versed;  50 mcg IV Fentanyl Total Moderate Sedation Time:  15 minutes The patient's level of consciousness and physiologic status were continuously monitored during the procedure by Radiology nursing.  PROCEDURE: The procedure, risks, benefits, and alternatives were explained to the patient. Questions regarding the procedure were encouraged and answered. The patient understands and consents to the procedure. A time out was performed prior to initiating the procedure. The lower lumbar paraspinal region was prepped with chlorhexidine in a sterile fashion, and a sterile  drape was applied covering the operative field. A sterile gown and sterile gloves were used for the procedure. Local anesthesia was provided with 1% Lidocaine. CT was performed of the lower lumbosacral spine in a prone position. Under CT guidance, an 18 gauge spinal needle was advanced into right-sided lumbosacral paraspinal soft tissues. Aspiration was performed. Aspirate was combined with some sterile saline and sent for culture analysis. COMPLICATIONS: None FINDINGS: Aspiration was performed in the right posterior paraspinous musculature at the L5-S1 level. These yielded approximately 1 mL of purulent and bloody fluid. IMPRESSION: CT-guided aspiration at the level of right posterior paraspinous musculature at the L5-S1 level. Approximately 1 mL of purulent and bloody fluid was able to be aspirated. This was combined with an additional 1 mL of sterile saline and sent for culture analysis. Electronically Signed   By: Aletta Edouard M.D.   On: 11/13/2016 13:23   Dg Chest Port 1 View  Result Date: 11/17/2016 CLINICAL DATA:  PICC placement EXAM: PORTABLE CHEST 1 VIEW COMPARISON:  11/14/2016 FINDINGS: Right arm PICC tip in the mid SVC. Bibasilar airspace disease has progressed in the interval. No significant heart failure or edema. Small left pleural effusion IMPRESSION: Satisfactory PICC placement in the mid SVC Progression of bibasilar airspace disease. Electronically Signed   By: Franchot Gallo M.D.   On: 11/17/2016 13:54   Dg Chest Port 1 View  Result Date: 11/13/2016 CLINICAL DATA:  83 year old male with shortness of breath. Extensive acute  lumbar spinal infection. Initial encounter. EXAM: PORTABLE CHEST 1 VIEW COMPARISON:  Chest radiographs 11/12/2016 and earlier. FINDINGS: Portable AP upright view at at 0950 hours. Stable cardiomegaly and mediastinal contours. Enlarged cardiac silhouette is in part related to large gastric hiatal hernia better demonstrated on the [facility] chest CT 11/06/2016. Veiling opacity at both lung bases. Confluent retrocardiac opacity. No pneumothorax. No superimposed pulmonary edema. Visualized tracheal air column is within normal limits. Calcified aortic atherosclerosis. IMPRESSION: Bilateral lower lobe collapse or consolidation and small bilateral pleural effusions are new since 11/06/2016. Calcified aortic atherosclerosis. Electronically Signed   By: Genevie Ann M.D.   On: 11/13/2016 10:12    Microbiology: Recent Results (from the past 240 hour(s))  Culture, blood (Routine X 2) w Reflex to ID Panel     Status: None   Collection Time: 11/12/16  6:36 PM  Result Value Ref Range Status   Specimen Description BLOOD LEFT HAND  Final   Special Requests BOTTLES DRAWN AEROBIC AND ANAEROBIC 5CC  Final   Culture NO GROWTH 5 DAYS  Final   Report Status 11/17/2016 FINAL  Final  Aerobic/Anaerobic Culture (surgical/deep wound)     Status: None (Preliminary result)   Collection Time: 11/13/16 12:57 PM  Result Value Ref Range Status   Specimen Description ABSCESS  Final   Special Requests RIGHT LUMBOSACRAL PARASPINAL ASPIRATION IN SYRINGE  Final   Gram Stain   Final    ABUNDANT WBC PRESENT, PREDOMINANTLY PMN NO ORGANISMS SEEN    Culture   Final    NO GROWTH 2 DAYS NO ANAEROBES ISOLATED; CULTURE IN PROGRESS FOR 5 DAYS   Report Status PENDING  Incomplete     Labs: Basic Metabolic Panel:  Recent Labs Lab 11/14/16 0212 11/15/16 0306 11/16/16 1001 11/17/16 0305 11/18/16 0411  NA 137 138 138 137 137  K 3.0* 3.2* 3.2* 3.8 4.3  CL 93* 92* 93* 94* 97*  CO2 32 34* 33* 34* 32  GLUCOSE 119* 124* 134* 123*  118*  BUN 19 27* 28*  29* 25*  CREATININE 1.26* 1.43* 1.38* 1.35* 1.43*  CALCIUM 8.3* 8.4* 8.6* 8.5* 8.4*  MG  --  2.0  --   --   --    Liver Function Tests:  Recent Labs Lab 11/12/16 0201 11/13/16 0147 11/14/16 0212 11/15/16 0306  AST 27 35 30 35  ALT 32 31 30 34  ALKPHOS 73 79 77 74  BILITOT 0.4 1.0 0.4 0.9  PROT 5.4* 5.5* 5.5* 5.6*  ALBUMIN 2.4* 2.4* 2.2* 2.3*   No results for input(s): LIPASE, AMYLASE in the last 168 hours. No results for input(s): AMMONIA in the last 168 hours. CBC:  Recent Labs Lab 11/12/16 0201 11/12/16 1651 11/14/16 0212 11/15/16 0306 11/18/16 0411  WBC 16.4* 16.0* 14.2* 14.2* 16.9*  NEUTROABS  --   --   --  11.9* 14.7*  HGB 10.3* 10.1* 10.9* 11.2* 10.1*  HCT 32.7* 32.2* 34.0* 35.0* 31.9*  MCV 90.3 89.9 89.2 89.7 91.1  PLT 209 234 342 377 379   Cardiac Enzymes: No results for input(s): CKTOTAL, CKMB, CKMBINDEX, TROPONINI in the last 168 hours. BNP: BNP (last 3 results) No results for input(s): BNP in the last 8760 hours.  ProBNP (last 3 results) No results for input(s): PROBNP in the last 8760 hours.  CBG: No results for input(s): GLUCAP in the last 168 hours.

## 2016-11-18 NOTE — Care Management Important Message (Signed)
Important Message  Patient Details  Name: Andrew Salas MRN: [id number] Date of Birth: 11-01-1926   Medicare Important Message Given:  Yes    [name], [name] [name] 11/18/2016, 11:46 AM

## 2016-11-18 NOTE — Progress Notes (Signed)
Xarelto coverage as follows:  S/W DESHEARIA @ [employer] [facility] # [id number]   XARELTO 20 MG DAILY 30 /30 TAB   COVER- YES  CO-PAY- $ 45.00  TIER- 3 DRUG  PRIOR APPROVAL- NO  PHARM,ACY : [facility]  Sidney AceJulie Cereniti Curb, RN, BSN  Case Management [phone]

## 2016-11-18 NOTE — Progress Notes (Addendum)
Pharmacy Antibiotic Note  Andrew Salas is a 83 y.o. male admitted on 11/11/2016 with acute encephalopathy.  Patient has a history of lumbosacral transforaminal epidural steroid injection on 11/04/16.  He is on vancomycin and ceftriaxone for epidural and paraspinal abscesses. Noted plans for six weeks antibiotics through 12/21/16. WBC up 16.9, afebrile, cultures negative to date. Therapeutic vancomycin trough on 12/31 on current dose. SCr stable 1.43, normalized CrCl~36, good UOP.  Plan:  -Continue vancomycin 1250mg  IV q24hr -F/u renal fxn, C&S, clinical status, and repeat trough prn   Height: 5\' 10"  (177.8 cm) Weight: 231 lb 4.8 oz (104.9 kg) IBW/kg (Calculated) : 73  AdjBW = 86 kg  Temp (24hrs), Avg:98.2 F (36.8 C), Min:97.8 F (36.6 C), Max:98.7 F (37.1 C)   Recent Labs Lab 11/12/16 0201 11/12/16 1651  11/14/16 0212 11/15/16 0306 11/15/16 1821 11/16/16 1001 11/17/16 0305 11/18/16 0411  WBC 16.4* 16.0*  --  14.2* 14.2*  --   --   --  16.9*  CREATININE 1.17  --   < > 1.26* 1.43*  --  1.38* 1.35* 1.43*  VANCOTROUGH  --   --   --   --   --  15  --   --   --   < > = values in this interval not displayed.  Estimated Creatinine Clearance: 43.3 mL/min (by C-G formula based on SCr of 1.43 mg/dL (H)).    No Known Allergies  Babs BertinHaley Yamilee Harmes, PharmD, BCPS Clinical Pharmacist 11/18/2016 10:39 AM

## 2016-11-19 DIAGNOSIS — G061 Intraspinal abscess and granuloma: Secondary | ICD-10-CM | POA: Diagnosis not present

## 2016-11-19 DIAGNOSIS — Z792 Long term (current) use of antibiotics: Secondary | ICD-10-CM | POA: Diagnosis not present

## 2016-11-19 DIAGNOSIS — Z5181 Encounter for therapeutic drug level monitoring: Secondary | ICD-10-CM | POA: Diagnosis not present

## 2016-11-19 DIAGNOSIS — M1711 Unilateral primary osteoarthritis, right knee: Secondary | ICD-10-CM | POA: Diagnosis not present

## 2016-11-19 DIAGNOSIS — A419 Sepsis, unspecified organism: Secondary | ICD-10-CM | POA: Diagnosis not present

## 2016-11-19 DIAGNOSIS — Z452 Encounter for adjustment and management of vascular access device: Secondary | ICD-10-CM | POA: Diagnosis not present

## 2016-11-19 DIAGNOSIS — I4891 Unspecified atrial fibrillation: Secondary | ICD-10-CM | POA: Diagnosis not present

## 2016-11-19 DIAGNOSIS — G039 Meningitis, unspecified: Secondary | ICD-10-CM | POA: Diagnosis not present

## 2016-11-19 DIAGNOSIS — Z9861 Coronary angioplasty status: Secondary | ICD-10-CM | POA: Diagnosis not present

## 2016-11-23 ENCOUNTER — Encounter: Payer: Self-pay | Admitting: Internal Medicine

## 2016-11-23 DIAGNOSIS — G061 Intraspinal abscess and granuloma: Secondary | ICD-10-CM | POA: Diagnosis not present

## 2016-11-23 DIAGNOSIS — G039 Meningitis, unspecified: Secondary | ICD-10-CM | POA: Diagnosis not present

## 2016-11-26 ENCOUNTER — Telehealth (INDEPENDENT_AMBULATORY_CARE_PROVIDER_SITE_OTHER): Payer: Self-pay | Admitting: Orthopaedic Surgery

## 2016-11-26 DIAGNOSIS — G061 Intraspinal abscess and granuloma: Secondary | ICD-10-CM | POA: Diagnosis not present

## 2016-11-26 DIAGNOSIS — G039 Meningitis, unspecified: Secondary | ICD-10-CM | POA: Diagnosis not present

## 2016-11-26 NOTE — Telephone Encounter (Signed)
Please advise 

## 2016-11-26 NOTE — Telephone Encounter (Signed)
Dr. Ophelia CharterYates, this is patient I was notified by the cardiologist that saw him in ED and admission.  Pretty uneventful injection which was single level transforaminal that had helped in past. He did have mostly midline back pain with no recent recent history of infection but urine was positive at ED evidently. MRI showed multilevel infection, paraspinal and facet and epidural.

## 2016-11-26 NOTE — Telephone Encounter (Signed)
Ok thanks. Will Call.

## 2016-11-26 NOTE — Telephone Encounter (Signed)
Pt called and stated he was in bad shape, pt has been in hospital 2 wks because shot he got with Dr. Alvester MorinNewton gave him an infection. Pt requesting to discuss because he thinks the shot he got gave him a disease.   Pt said "he wants to try to handle without any trouble"  Pt requests call from Dr. Ophelia CharterYates. [phone]

## 2016-11-26 NOTE — Telephone Encounter (Signed)
I called and discussed with him. He is getting therapy, is weak , using walker, gettting ABX , slow improvement as expected. Has appt with Dutch QuintPoole coming up. I mentioned that u also might call him.  He just had ?'s about why he got sick .  Discussed that he had prev. ESI without problems etc. Sometimes infections just occur even with perfect care. He had ABX for pyuria as you mention.ed.   Might be good for you to talk with him also. Patient just wants to get better.  I have taken care of him for probably  20 yrs . You can call him tomorrow

## 2016-11-27 DIAGNOSIS — G061 Intraspinal abscess and granuloma: Secondary | ICD-10-CM | POA: Diagnosis not present

## 2016-11-27 DIAGNOSIS — M179 Osteoarthritis of knee, unspecified: Secondary | ICD-10-CM | POA: Diagnosis not present

## 2016-11-27 DIAGNOSIS — Z96659 Presence of unspecified artificial knee joint: Secondary | ICD-10-CM | POA: Diagnosis not present

## 2016-11-28 ENCOUNTER — Emergency Department (HOSPITAL_COMMUNITY): Payer: Medicare Other

## 2016-11-28 ENCOUNTER — Inpatient Hospital Stay (HOSPITAL_COMMUNITY)
Admission: EM | Admit: 2016-11-28 | Discharge: 2016-12-01 | DRG: 604 | Disposition: A | Discharge status: Home Health | Payer: Medicare Other | Attending: Family Medicine | Admitting: Family Medicine

## 2016-11-28 ENCOUNTER — Encounter (HOSPITAL_COMMUNITY): Payer: Self-pay

## 2016-11-28 DIAGNOSIS — Z96651 Presence of right artificial knee joint: Secondary | ICD-10-CM | POA: Diagnosis present

## 2016-11-28 DIAGNOSIS — E860 Dehydration: Secondary | ICD-10-CM | POA: Diagnosis present

## 2016-11-28 DIAGNOSIS — Y92009 Unspecified place in unspecified non-institutional (private) residence as the place of occurrence of the external cause: Secondary | ICD-10-CM

## 2016-11-28 DIAGNOSIS — S301XXA Contusion of abdominal wall, initial encounter: Secondary | ICD-10-CM

## 2016-11-28 DIAGNOSIS — R55 Syncope and collapse: Secondary | ICD-10-CM | POA: Diagnosis not present

## 2016-11-28 DIAGNOSIS — R5381 Other malaise: Secondary | ICD-10-CM | POA: Diagnosis not present

## 2016-11-28 DIAGNOSIS — I130 Hypertensive heart and chronic kidney disease with heart failure and stage 1 through stage 4 chronic kidney disease, or unspecified chronic kidney disease: Secondary | ICD-10-CM | POA: Diagnosis not present

## 2016-11-28 DIAGNOSIS — E785 Hyperlipidemia, unspecified: Secondary | ICD-10-CM | POA: Diagnosis present

## 2016-11-28 DIAGNOSIS — S3993XA Unspecified injury of pelvis, initial encounter: Secondary | ICD-10-CM | POA: Diagnosis not present

## 2016-11-28 DIAGNOSIS — I4891 Unspecified atrial fibrillation: Secondary | ICD-10-CM | POA: Diagnosis present

## 2016-11-28 DIAGNOSIS — M25551 Pain in right hip: Secondary | ICD-10-CM | POA: Diagnosis not present

## 2016-11-28 DIAGNOSIS — G061 Intraspinal abscess and granuloma: Secondary | ICD-10-CM | POA: Diagnosis present

## 2016-11-28 DIAGNOSIS — Z7901 Long term (current) use of anticoagulants: Secondary | ICD-10-CM

## 2016-11-28 DIAGNOSIS — I5030 Unspecified diastolic (congestive) heart failure: Secondary | ICD-10-CM | POA: Diagnosis present

## 2016-11-28 DIAGNOSIS — R52 Pain, unspecified: Secondary | ICD-10-CM | POA: Diagnosis not present

## 2016-11-28 DIAGNOSIS — I4821 Permanent atrial fibrillation: Secondary | ICD-10-CM | POA: Diagnosis present

## 2016-11-28 DIAGNOSIS — D62 Acute posthemorrhagic anemia: Secondary | ICD-10-CM | POA: Diagnosis not present

## 2016-11-28 DIAGNOSIS — Z87891 Personal history of nicotine dependence: Secondary | ICD-10-CM

## 2016-11-28 DIAGNOSIS — W1830XA Fall on same level, unspecified, initial encounter: Secondary | ICD-10-CM | POA: Diagnosis present

## 2016-11-28 DIAGNOSIS — Z79899 Other long term (current) drug therapy: Secondary | ICD-10-CM | POA: Diagnosis not present

## 2016-11-28 DIAGNOSIS — N183 Chronic kidney disease, stage 3 (moderate): Secondary | ICD-10-CM | POA: Diagnosis present

## 2016-11-28 DIAGNOSIS — W19XXXA Unspecified fall, initial encounter: Secondary | ICD-10-CM

## 2016-11-28 DIAGNOSIS — M4640 Discitis, unspecified, site unspecified: Secondary | ICD-10-CM | POA: Diagnosis present

## 2016-11-28 DIAGNOSIS — S7001XA Contusion of right hip, initial encounter: Secondary | ICD-10-CM | POA: Diagnosis not present

## 2016-11-28 HISTORY — DX: Syncope and collapse: R55

## 2016-11-28 HISTORY — DX: Contusion of abdominal wall, initial encounter: S30.1XXA

## 2016-11-28 LAB — CBC WITH DIFFERENTIAL/PLATELET
BASOS PCT: 0 %
Basophils Absolute: 0 10*3/uL (ref 0.0–0.1)
Eosinophils Absolute: 0.1 10*3/uL (ref 0.0–0.7)
Eosinophils Relative: 0 %
HEMATOCRIT: 24.8 % — AB (ref 39.0–52.0)
HEMOGLOBIN: 7.8 g/dL — AB (ref 13.0–17.0)
Lymphocytes Relative: 7 %
Lymphs Abs: 0.8 10*3/uL (ref 0.7–4.0)
MCH: 28.3 pg (ref 26.0–34.0)
MCHC: 31.5 g/dL (ref 30.0–36.0)
MCV: 89.9 fL (ref 78.0–100.0)
MONO ABS: 0.8 10*3/uL (ref 0.1–1.0)
MONOS PCT: 7 %
NEUTROS ABS: 10.1 10*3/uL — AB (ref 1.7–7.7)
NEUTROS PCT: 86 %
Platelets: 215 10*3/uL (ref 150–400)
RBC: 2.76 MIL/uL — ABNORMAL LOW (ref 4.22–5.81)
RDW: 14.9 % (ref 11.5–15.5)
WBC: 11.7 10*3/uL — ABNORMAL HIGH (ref 4.0–10.5)

## 2016-11-28 LAB — BASIC METABOLIC PANEL
ANION GAP: 10 (ref 5–15)
BUN: 17 mg/dL (ref 6–20)
CALCIUM: 8.3 mg/dL — AB (ref 8.9–10.3)
CHLORIDE: 101 mmol/L (ref 101–111)
CO2: 24 mmol/L (ref 22–32)
Creatinine, Ser: 1.32 mg/dL — ABNORMAL HIGH (ref 0.61–1.24)
GFR calc non Af Amer: 46 mL/min — ABNORMAL LOW (ref >60)
GFR, EST AFRICAN AMERICAN: 54 mL/min — AB (ref >60)
GLUCOSE: 117 mg/dL — AB (ref 65–99)
Potassium: 4 mmol/L (ref 3.5–5.1)
Sodium: 135 mmol/L (ref 135–145)

## 2016-11-28 LAB — PROTIME-INR
INR: 1.93
Prothrombin Time: 22.4 seconds — ABNORMAL HIGH (ref 11.4–15.2)

## 2016-11-28 LAB — CBC
HCT: 23.2 % — ABNORMAL LOW (ref 39.0–52.0)
HEMOGLOBIN: 7.3 g/dL — AB (ref 13.0–17.0)
MCH: 28.7 pg (ref 26.0–34.0)
MCHC: 31.5 g/dL (ref 30.0–36.0)
MCV: 91.3 fL (ref 78.0–100.0)
Platelets: 209 10*3/uL (ref 150–400)
RBC: 2.54 MIL/uL — ABNORMAL LOW (ref 4.22–5.81)
RDW: 14.9 % (ref 11.5–15.5)
WBC: 11.5 10*3/uL — ABNORMAL HIGH (ref 4.0–10.5)

## 2016-11-28 LAB — POC OCCULT BLOOD, ED: FECAL OCCULT BLD: NEGATIVE

## 2016-11-28 LAB — PREPARE RBC (CROSSMATCH)

## 2016-11-28 MED ORDER — CYCLOSPORINE 0.05 % OP EMUL
1.0000 [drp] | Freq: Two times a day (BID) | OPHTHALMIC | Status: DC
  Administered 2016-11-28 – 2016-12-01 (×6): 1 [drp] via OPHTHALMIC
  Filled 2016-11-28 (×7): qty 1

## 2016-11-28 MED ORDER — SODIUM CHLORIDE 0.9% FLUSH
10.0000 mL | INTRAVENOUS | Status: DC | PRN
  Administered 2016-12-01: 10 mL
  Filled 2016-11-28: qty 40

## 2016-11-28 MED ORDER — DEXTROSE 5 % IV SOLN
2.0000 g | Freq: Two times a day (BID) | INTRAVENOUS | Status: DC
  Administered 2016-11-28 – 2016-12-01 (×6): 2 g via INTRAVENOUS
  Filled 2016-11-28 (×7): qty 2

## 2016-11-28 MED ORDER — PRESERVISION AREDS 2 PO CAPS: 1.0000 | ORAL_CAPSULE | Freq: Two times a day (BID) | ORAL | Status: DC

## 2016-11-28 MED ORDER — POLYETHYLENE GLYCOL 3350 17 G PO PACK: 17.0000 g | PACK | Freq: Every day | ORAL | Status: DC | PRN

## 2016-11-28 MED ORDER — OCUVITE-LUTEIN PO CAPS
1.0000 | ORAL_CAPSULE | Freq: Two times a day (BID) | ORAL | Status: DC
  Filled 2016-11-28: qty 1

## 2016-11-28 MED ORDER — SODIUM CHLORIDE 0.9 % IV SOLN
Freq: Once | INTRAVENOUS | Status: AC
  Administered 2016-11-29: 01:00:00 via INTRAVENOUS

## 2016-11-28 MED ORDER — CARBOXYMETHYLCELLULOSE SODIUM 0.5 % OP SOLN: 1.0000 [drp] | Freq: Three times a day (TID) | OPHTHALMIC | Status: DC | PRN

## 2016-11-28 MED ORDER — TRAZODONE HCL 50 MG PO TABS
25.0000 mg | ORAL_TABLET | Freq: Every evening | ORAL | Status: DC | PRN
  Administered 2016-11-29 – 2016-11-30 (×2): 25 mg via ORAL
  Filled 2016-11-28 (×2): qty 1

## 2016-11-28 MED ORDER — PROSIGHT PO TABS
1.0000 | ORAL_TABLET | Freq: Two times a day (BID) | ORAL | Status: DC
  Administered 2016-11-28 – 2016-12-01 (×6): 1 via ORAL
  Filled 2016-11-28 (×8): qty 1

## 2016-11-28 MED ORDER — DILTIAZEM HCL ER COATED BEADS 240 MG PO CP24
240.0000 mg | ORAL_CAPSULE | Freq: Every day | ORAL | Status: DC
  Administered 2016-11-28 – 2016-12-01 (×4): 240 mg via ORAL
  Filled 2016-11-28 (×5): qty 1

## 2016-11-28 MED ORDER — PRAVASTATIN SODIUM 40 MG PO TABS
40.0000 mg | ORAL_TABLET | Freq: Every day | ORAL | Status: DC
  Administered 2016-11-28 – 2016-12-01 (×4): 40 mg via ORAL
  Filled 2016-11-28 (×4): qty 1

## 2016-11-28 MED ORDER — ONDANSETRON HCL 4 MG PO TABS: 4.0000 mg | ORAL_TABLET | Freq: Four times a day (QID) | ORAL | Status: DC | PRN

## 2016-11-28 MED ORDER — CEFTRIAXONE IV (FOR PTA / DISCHARGE USE ONLY): 2.0000 g | Freq: Two times a day (BID) | INTRAVENOUS | Status: DC

## 2016-11-28 MED ORDER — SODIUM CHLORIDE 0.9% FLUSH
3.0000 mL | Freq: Two times a day (BID) | INTRAVENOUS | Status: DC
Start: 2016-11-28 — End: 2016-12-01

## 2016-11-28 MED ORDER — SODIUM CHLORIDE 0.9% FLUSH: 3.0000 mL | Freq: Two times a day (BID) | INTRAVENOUS | Status: DC

## 2016-11-28 MED ORDER — ONDANSETRON HCL 4 MG/2ML IJ SOLN: 4.0000 mg | Freq: Four times a day (QID) | INTRAMUSCULAR | Status: DC | PRN

## 2016-11-28 MED ORDER — POLYVINYL ALCOHOL 1.4 % OP SOLN
1.0000 [drp] | OPHTHALMIC | Status: DC | PRN
  Administered 2016-11-29 – 2016-11-30 (×2): 1 [drp] via OPHTHALMIC
  Filled 2016-11-28: qty 15

## 2016-11-28 MED ORDER — VANCOMYCIN HCL 10 G IV SOLR
1250.0000 mg | INTRAVENOUS | Status: DC
  Administered 2016-11-28 – 2016-11-30 (×3): 1250 mg via INTRAVENOUS
  Filled 2016-11-28 (×4): qty 1250

## 2016-11-28 MED ORDER — HYDROCODONE-ACETAMINOPHEN 5-325 MG PO TABS
1.0000 | ORAL_TABLET | ORAL | Status: DC | PRN
  Administered 2016-11-29 – 2016-11-30 (×2): 2 via ORAL
  Filled 2016-11-28 (×2): qty 2

## 2016-11-28 MED ORDER — LATANOPROST 0.005 % OP SOLN
1.0000 [drp] | Freq: Every day | OPHTHALMIC | Status: DC
  Administered 2016-11-28 – 2016-11-30 (×3): 1 [drp] via OPHTHALMIC
  Filled 2016-11-28: qty 2.5

## 2016-11-28 MED ORDER — DEXTROSE 5 % IV SOLN
2.0000 g | Freq: Two times a day (BID) | INTRAVENOUS | Status: DC
  Filled 2016-11-28: qty 2

## 2016-11-28 MED ORDER — SODIUM CHLORIDE 0.9 % IV SOLN: Freq: Once | INTRAVENOUS | Status: DC

## 2016-11-28 MED ORDER — TRAMADOL HCL 50 MG PO TABS: 50.0000 mg | ORAL_TABLET | Freq: Three times a day (TID) | ORAL | Status: DC | PRN

## 2016-11-28 MED ORDER — VANCOMYCIN IV (FOR PTA / DISCHARGE USE ONLY): 1250.0000 mg | INTRAVENOUS | Status: DC

## 2016-11-28 NOTE — ED Triage Notes (Signed)
To room via EMS.  Onset yesterday 5pm pt fell against wood part of sofa, c/o right hip pain.  Bruising noted right flank, hip, and anterior upper thigh.  Pt took Tramadol @ 10am with little relief.  Pt has PICC in right upper arm.

## 2016-11-28 NOTE — ED Notes (Signed)
MD Ortiz at bedside 

## 2016-11-28 NOTE — ED Provider Notes (Signed)
[facility] DEPT Provider Note   CSN: [id number] Arrival date & time: 11/28/16  1110     History   Chief Complaint Chief Complaint  Patient presents with  . Fall  . Hip Pain    HPI Andrew Salas is a 83 y.o. male.  HPI  Patient presents after fall occurred yesterday.Patient recalls them lightheaded, almost passing out, striking a table. Since the fall patient has had pain persistently in the right lateral hip worse with activity, and now has minimal weightbearing capacity. There is associated ecchymosis throughout the area, but no other cutaneous changes, no lacerations. Patient recalls the entirety of the event, states that he felt unsteady, fell against a table, then to the ground. No head trauma, no loss of consciousness.  Notably, the patient has a history of recent infection of the epidural space following spinal procedure. Patient is currently receiving central line IV therapy at home. He notes he continues to take all medication as directed. Patient is also on anticoagulants with history of A. fib.   Past Medical History:  Diagnosis Date  . Abscess in epidural space of lumbar spine 10/2016  . Arthritis    R knee  . Hyperlipidemia   . Hypertension    Dr Bettina Gavia in [place] is pt's cardiologist.  . Pneumonia   . Ulcer ([place])    stomach 60 years ago- no current problem    Patient Active Problem List   Diagnosis Date Noted  . Abscess in epidural space of lumbar spine 11/17/2016  . Abnormal CXR   . Discitis   . PNA (pneumonia)   . Meningitis 11/11/2016  . A-fib [facility]), new onset [facility] 12/25 11/11/2016  . Obesity hypoventilation syndrome ([place]), presumed 11/11/2016  . UTI (urinary tract infection) 11/11/2016  . Sepsis ([place]) 11/11/2016  . Osteoarthritis of right knee 02/05/2012    Class: End Stage    Past Surgical History:  Procedure Laterality Date  . CARDIAC CATHETERIZATION  6 yrs ago   [facility]  . EYE SURGERY  12/2010   Cataract bil with lens implant, please be careful if washing  eyes  . HARDWARE REMOVAL  12/25/2011   Procedure: HARDWARE REMOVAL;  Surgeon: Marybelle Killings, MD;  Location: [facility];  Service: Orthopedics;  Laterality: Right;   Hardware Removal Right Femur  . HERNIA REPAIR     per chest X- Ray  . KNEE ARTHROPLASTY  02/03/2012   Procedure: COMPUTER ASSISTED TOTAL KNEE ARTHROPLASTY;  Surgeon: Marybelle Killings, MD;  Location: [facility];  Service: Orthopedics;  Laterality: Right;  Right Total Knee Arthroplasty, Cemented  . KNEE ARTHROSCOPY     bilateral  . KNEE ARTHROSCOPY  1989   Right  . KNEE RECONSTRUCTION, MEDIAL PATELLAR FEMORAL LIGAMENT     "screws and pins inserted" from a fracture  . PROSTATE SURGERY     years ago "the Dr microwaved it"  . REPLACEMENT TOTAL KNEE         Home Medications    Prior to Admission medications   Medication Sig Start Date End Date Taking? Authorizing Provider  benazepril (LOTENSIN) 40 MG tablet Take 40 mg by mouth daily.    Historical Provider, MD  carboxymethylcellulose (REFRESH PLUS) 0.5 % SOLN Place 1 drop into both eyes 3 (three) times daily as needed (for dryness).     Historical Provider, MD  cefTRIAXone (ROCEPHIN) IVPB Inject 2 g into the vein every 12 (twelve) hours. Indication:  Lumbar epidural abscess Last Day of Therapy:  12/21/16 Labs - Once weekly:  CBC/D and BMP, Labs - Every other week:  ESR and CRP 11/17/16 12/21/16  Eber Jones, MD  cefTRIAXone 2 g in dextrose 5 % 50 mL Inject 2 g into the vein every 12 (twelve) hours. 11/17/16 12/21/16  Eber Jones, MD  diltiazem (CARDIZEM CD) 240 MG 24 hr capsule Take 1 capsule (240 mg total) by mouth daily. 11/18/16   Eber Jones, MD  latanoprost (XALATAN) 0.005 % ophthalmic solution Place 1 drop into both eyes at bedtime.    Historical Provider, MD  Multiple Vitamins-Minerals (PRESERVISION AREDS 2 PO) Take 1 tablet by mouth 2 (two) times daily.    Historical Provider, MD  pravastatin (PRAVACHOL) 40 MG  tablet Take 40 mg by mouth daily.    Historical Provider, MD  rivaroxaban (XARELTO) 20 MG TABS tablet Take 1 tablet (20 mg total) by mouth daily with supper. 11/17/16   Eber Jones, MD  traMADol (ULTRAM) 50 MG tablet Take 50 mg by mouth 3 (three) times daily as needed for pain. 11/06/16   Historical Provider, MD  vancomycin 1,250 mg in sodium chloride 0.9 % 250 mL Inject 1,250 mg into the vein daily. 11/17/16 12/21/16  Eber Jones, MD  vancomycin IVPB Inject 1,250 mg into the vein daily. Indication:  Lumbar epidural abscess Last Day of Therapy:  12/21/16 Labs - Sunday/Monday:  CBC/D, BMP, and vancomycin trough. Labs - Thursday:  BMP and vancomycin trough Labs - Every other week:  ESR and CRP 11/17/16 12/21/16  Eber Jones, MD    Family History Family History  Problem Relation Age of Onset  . Anesthesia problems Neg Hx     Social History Social History  Substance Use Topics  . Smoking status: Former Smoker    Years: 6.00  . Smokeless tobacco: Former Systems developer    Quit date: 11/16/1950  . Alcohol use 3.6 oz/week    6 Glasses of wine per week     Allergies   Patient has no known allergies.   Review of Systems Review of Systems  Constitutional:       Per HPI, otherwise negative  HENT:       Per HPI, otherwise negative  Respiratory:       Per HPI, otherwise negative  Cardiovascular:       Per HPI, otherwise negative  Gastrointestinal: Negative for vomiting.  Endocrine:       Negative aside from HPI  Genitourinary:       Neg aside from HPI   Musculoskeletal:       Per HPI, otherwise negative  Skin: Negative.   Allergic/Immunologic:       As above, ongoing IV access antibiotics  Neurological: Positive for weakness. Negative for syncope.  Hematological: Bruises/bleeds easily.     Physical Exam Updated Vital Signs BP (!) 120/107   Pulse 95   Temp 97.6 F (36.4 C) (Oral)   Resp 18   SpO2 96%   Physical Exam  Constitutional: He is oriented to person,  place, and time. He appears well-developed. No distress.  HENT:  Head: Normocephalic and atraumatic.  Eyes: Conjunctivae and EOM are normal.  Cardiovascular: Normal rate and regular rhythm.   Pulmonary/Chest: Effort normal. No stridor. No respiratory distress.  Abdominal: He exhibits no distension.  Genitourinary: Rectum normal. Rectal exam shows no tenderness.  Genitourinary Comments: Hemoccult negative  Musculoskeletal: He exhibits no edema.  Tender to palpation throughout the right lateral hip, but the patient is able  to flex each independently, has 5/5 strength in both lower extremities.   Just proximal, posterior to the right hip there is a indurated firm area with ecchymosis overlying  PICC line in place, right upper extremity, unremarkable.  Neurological: He is alert and oriented to person, place, and time.  Skin: Skin is warm and dry.  Multiple areas of ecchymosis, most prominently, relatively, about the right hip, laterally, anteriorly  Psychiatric: He has a normal mood and affect.  Nursing note and vitals reviewed.    ED Treatments / Results  Labs (all labs ordered are listed, but only abnormal results are displayed) Labs Reviewed  BASIC METABOLIC PANEL - Abnormal; Notable for the following:       Result Value   Glucose, Bld 117 (*)    Creatinine, Ser 1.32 (*)    Calcium 8.3 (*)    GFR calc non Af Amer 46 (*)    GFR calc Af Amer 54 (*)    All other components within normal limits  CBC WITH DIFFERENTIAL/PLATELET - Abnormal; Notable for the following:    WBC 11.7 (*)    RBC 2.76 (*)    Hemoglobin 7.8 (*)    HCT 24.8 (*)    Neutro Abs 10.1 (*)    All other components within normal limits  PROTIME-INR - Abnormal; Notable for the following:    Prothrombin Time 22.4 (*)    All other components within normal limits  POC OCCULT BLOOD, ED    EKG  EKG Interpretation  Date/Time:  Saturday November 28 2016 15:32:51 EST Ventricular Rate:  97 PR Interval:    QRS  Duration: 147 QT Interval:  374 QTC Calculation: 476 R Axis:   -53 Text Interpretation:  Unknown rhythm, irregular rate RBBB and LAFB Confirmed by RAY MD, Andee Poles ([id number]) on 11/28/2016 3:42:03 PM       Radiology Dg Hip Unilat With Pelvis 2-3 Views Right  Result Date: 11/28/2016 CLINICAL DATA:  Onset yesterday 5pm pt fell against wood part of sofa, c/o right hip pain. Bruising noted right flank, hip, and anterior upper thigh. Hx right distal femur and knee surgery. EXAM: DG HIP (WITH OR WITHOUT PELVIS) 2-3V RIGHT COMPARISON:  None. FINDINGS: No acute fracture.  No dislocation.  No bone lesion. There is a smooth defect along the right iliac wing consistent with a previous bone graft donor site. Hip joints, SI joints and symphysis pubis are normally aligned. Bones are demineralized. There are aortoiliac and femoral vessel vascular calcifications. Soft tissues are otherwise unremarkable. IMPRESSION: 1. No acute fracture or dislocation. Electronically Signed   By: Lajean Manes M.D.   On: 11/28/2016 12:46    Procedures Procedures (including critical care time)  Medications Ordered in ED Medications - No data to display   Initial Impression / Assessment and Plan / ED Course  I have reviewed the triage vital signs and the nursing notes.  Pertinent labs & imaging results that were available during my care of the patient were reviewed by me and considered in my medical decision making (see chart for details).  Clinical Course     On repeat exam after the patient returned from x-ray he is awake and alert, in no distress. We discussed the patient's normal x-ray, and with ongoing pain to skin is anticipated to exclude occult fracture. Patient's initial labs notable for anemia. Patient is Hemoccult negative, but with ecchymosis, indurated area just proximal to the thigh there is suggestion of posttraumatic hematoma. No evidence for active extravasation.  Patient remains tachycardic. 3:55  PM Patient is pale. He and his wife are aware of all findings, with concern for near syncope, fall, anemia, possibly secondary to fall, as well as the patient's current use of anticoagulants, and given the patient's ongoing use of multiple antibiotics, the patient will be admitted for further evaluation, management.  On admission CT scan is pending to examine for occult fracture. Final Clinical Impressions(s) / ED Diagnoses  Near-syncope Fall Subcutaneous hematoma Hip pain    Carmin Muskrat, MD 11/28/16 1557

## 2016-11-28 NOTE — H&P (Signed)
History and Physical    Andrew Salas YFV:[id number] DOB: August 20, 1926 DOA: 11/28/2016  PCP: Andrew Dress, MD Patient coming from:  Home  Chief Complaint: Syncope HPI: Andrew Salas is a 83 y.o. male with medical history significant of hypertension, hyperlipidemia, osteoarthritis, low back, around epidural injection on 11/04/2016 and was admitted to [facility] with encephalopathy associated with epidural and paraspinal abscess.  Patient  was discharged home on January 3 with plan to continue IV antibiotics Rocephin and vancomycin for a total of 6 week therapy. He presented to the emergency department today after he sustained a syncopal episode at home last night. Patient attempted to get off the recliner and when he stood up, he felt extremely weak and had a sensation like the whole objects in the room are moving away from him and he started falling heating the table. The next thing he remembered was finding himself on the floor near recliner. His wife was in another room when the accident happened and she walked into the room when she heard the noise. Patient managed to get up with assistance of his wife but since then experienced persistent pain in the right hip. Today woke up and severe pain and the right flank and inability to bear weight on the right hip    ED Course: In the emergency department she was afebrile, vital signs were stable, white blood cells count was slightly elevated to 11,700 but hemoglobin she lived significant drop from the baseline he was discharged from the hospital on January 3 with hemoglobin 10.1, and today is 7.8 g/dL On examination, right hip has a large ecchymosis extending from the groin area to the left flank, overlying skin tout and hot to touch CT of the pelvis revealed no fracture or dislocation, right gluteus maximus hematoma with adjacent soft tissue contusion and swelling and fluid distending into the right greater trochanteric  bursa   Review of Systems: As per HPI otherwise 10 point review of systems negative.   Ambulatory Status: was able to ambulate before fall  Past Medical History:  Diagnosis Date  . Abscess in epidural space of lumbar spine 10/2016  . Arthritis    R knee  . Hyperlipidemia   . Hypertension    Dr Andrew Salas in [place] is pt's cardiologist.  . Pneumonia   . Ulcer ([place])    stomach 60 years ago- no current problem    Past Surgical History:  Procedure Laterality Date  . CARDIAC CATHETERIZATION  6 yrs ago   [facility]  . EYE SURGERY  12/2010   Cataract bil with lens implant, please be careful if washing  eyes  . HARDWARE REMOVAL  12/25/2011   Procedure: HARDWARE REMOVAL;  Surgeon: Marybelle Killings, MD;  Location: [place];  Service: Orthopedics;  Laterality: Right;   Hardware Removal Right Femur  . HERNIA REPAIR     per chest X- Ray  . KNEE ARTHROPLASTY  02/03/2012   Procedure: COMPUTER ASSISTED TOTAL KNEE ARTHROPLASTY;  Surgeon: Marybelle Killings, MD;  Location: [place];  Service: Orthopedics;  Laterality: Right;  Right Total Knee Arthroplasty, Cemented  . KNEE ARTHROSCOPY     bilateral  . KNEE ARTHROSCOPY  1989   Right  . KNEE RECONSTRUCTION, MEDIAL PATELLAR FEMORAL LIGAMENT     "screws and pins inserted" from a fracture  . PROSTATE SURGERY     years ago "the Dr microwaved it"  . REPLACEMENT TOTAL KNEE      Social History  Social History  . Marital status: Widowed    Spouse name: N/A  . Number of children: N/A  . Years of education: N/A   Occupational History  . Not on file.   Social History Main Topics  . Smoking status: Former Smoker    Years: 6.00  . Smokeless tobacco: Former Systems developer    Quit date: 11/16/1950  . Alcohol use 3.6 oz/week    6 Glasses of wine per week  . Drug use: No  . Sexual activity: Not on file   Other Topics Concern  . Not on file   Social History Narrative  . No narrative on file    No Known Allergies  Family History  Problem Relation Age of  Onset  . Anesthesia problems Neg Hx     Prior to Admission medications   Medication Sig Start Date End Date Taking? Authorizing Provider  benazepril (LOTENSIN) 40 MG tablet Take 40 mg by mouth daily.   Yes Historical Provider, MD  carboxymethylcellulose (REFRESH PLUS) 0.5 % SOLN Place 1 drop into both eyes 3 (three) times daily as needed (for dryness).    Yes Historical Provider, MD  cefTRIAXone (ROCEPHIN) IVPB Inject 2 g into the vein every 12 (twelve) hours. Indication:  Lumbar epidural abscess Last Day of Therapy:  12/21/16 Labs - Once weekly:  CBC/D and BMP, Labs - Every other week:  ESR and CRP 11/17/16 12/21/16 Yes Eber Jones, MD  cefTRIAXone 2 g in dextrose 5 % 50 mL Inject 2 g into the vein every 12 (twelve) hours. 11/17/16 12/21/16 Yes Eber Jones, MD  cycloSPORINE (RESTASIS) 0.05 % ophthalmic emulsion Place 1 drop into both eyes 2 (two) times daily.   Yes Historical Provider, MD  diclofenac sodium (VOLTAREN) 1 % GEL Apply 2 g topically 4 (four) times daily.   Yes Historical Provider, MD  diltiazem (CARDIZEM CD) 240 MG 24 hr capsule Take 1 capsule (240 mg total) by mouth daily. 11/18/16  Yes Eber Jones, MD  latanoprost (XALATAN) 0.005 % ophthalmic solution Place 1 drop into both eyes at bedtime.   Yes Historical Provider, MD  loperamide (IMODIUM) 2 MG capsule Take 2 mg by mouth as needed for diarrhea or loose stools.   Yes Historical Provider, MD  Multiple Vitamins-Minerals (PRESERVISION AREDS 2 PO) Take 1 tablet by mouth 2 (two) times daily.   Yes Historical Provider, MD  pravastatin (PRAVACHOL) 40 MG tablet Take 40 mg by mouth daily.   Yes Historical Provider, MD  rivaroxaban (XARELTO) 20 MG TABS tablet Take 1 tablet (20 mg total) by mouth daily with supper. 11/17/16  Yes Eber Jones, MD  traMADol (ULTRAM) 50 MG tablet Take 50 mg by mouth 3 (three) times daily as needed for pain. 11/06/16  Yes Historical Provider, MD  vancomycin 1,250 mg in sodium chloride 0.9  % 250 mL Inject 1,250 mg into the vein daily. 11/17/16 12/21/16 Yes Eber Jones, MD  vancomycin IVPB Inject 1,250 mg into the vein daily. Indication:  Lumbar epidural abscess Last Day of Therapy:  12/21/16 Labs - Sunday/Monday:  CBC/D, BMP, and vancomycin trough. Labs - Thursday:  BMP and vancomycin trough Labs - Every other week:  ESR and CRP 11/17/16 12/21/16 Yes Eber Jones, MD    Physical Exam: Vitals:   11/28/16 1530 11/28/16 1626 11/28/16 1700 11/28/16 1730  BP: 124/91 146/96 165/79 150/72  Pulse: 99 82 106   Resp: _0 Temp:      TempSrc:  SpO2: 96% 100% 93%      General: Appears calm and comfortable Eyes: PERRLA, EOMI, normal lids, iris ENT:  grossly normal hearing, lips & tongue, mucous membranes moist and intact Neck: no lymphoadenopathy, masses or thyromegaly Cardiovascular: RRR, no m/r/g. No JVD, carotid bruits. No LE edema.  Respiratory: bilateral no wheezes, rales, rhonchi or cracles. Normal respiratory effort. No accessory muscle use observed Abdomen: soft, non-tender, non-distended, no organomegaly or masses appreciated. BS present in all quadrants Skin: no rash, ulcers or induration seen on limited exam, large ecchymosis coming from the right groin towards the right flank Musculoskeletal: grossly normal tone BUE/BLE, Limited ROM of the right, no bony abnormality or joint deformities observed Psychiatric: grossly normal mood and affect, speech fluent and appropriate, alert and oriented x3 Neurologic: CN II-XII grossly intact, moves all extremities in coordinated fashion, sensation intact  Labs on Admission: I have personally reviewed following labs and imaging studies  CBC, BMP  GFR: CrCl cannot be calculated (Unknown ideal weight.).   Creatinine Clearance: CrCl cannot be calculated (Unknown ideal weight.).    Radiological Exams on Admission: Ct Pelvis Wo Contrast  Result Date: 11/28/2016 CLINICAL DATA:  Pt fell against wood part of  sofa, c/o right hip pain. Bruising noted right flank, hip, and anterior upper thigh. EXAM: CT PELVIS WITHOUT CONTRAST TECHNIQUE: Multidetector CT imaging of the pelvis was performed following the standard protocol without intravenous contrast. COMPARISON:  11/08/2016 FINDINGS: There is enlargement of the anterior aspect of the right gluteus maximus consistent with a hematoma. There is patchy and linear opacity in the adjacent fat consistent with additional hemorrhage/edema. There is fluid attenuation adjacent to the right greater trochanter consistent with bursal fluid. No acute findings within the pelvis. There are significant aortic and iliac vessel vascular calcifications. Prostate gland is mildly enlarged. No pelvic adenopathy or masses. Visualized bowel is unremarkable. No abnormality of the bladder. No fracture. Mild bilateral hip joint degenerative changes noted. There are chronic pars defects at L4-L5 with a grade 1 anterolisthesis IMPRESSION: 1. No fracture or dislocation. 2. Right gluteus maximus hematoma with adjacent soft tissue contusion/edema. There is also fluid distending the right greater trochanteric bursa. 3. No other acute abnormality. Electronically Signed   By: Lajean Manes M.D.   On: 11/28/2016 16:41   Dg Hip Unilat With Pelvis 2-3 Views Right  Result Date: 11/28/2016 CLINICAL DATA:  Onset yesterday 5pm pt fell against wood part of sofa, c/o right hip pain. Bruising noted right flank, hip, and anterior upper thigh. Hx right distal femur and knee surgery. EXAM: DG HIP (WITH OR WITHOUT PELVIS) 2-3V RIGHT COMPARISON:  None. FINDINGS: No acute fracture.  No dislocation.  No bone lesion. There is a smooth defect along the right iliac wing consistent with a previous bone graft donor site. Hip joints, SI joints and symphysis pubis are normally aligned. Bones are demineralized. There are aortoiliac and femoral vessel vascular calcifications. Soft tissues are otherwise unremarkable. IMPRESSION: 1.  No acute fracture or dislocation. Electronically Signed   By: Lajean Manes M.D.   On: 11/28/2016 12:46    EKG: Independently reviewed - atrial fibrillation and nonspecific ST-T wave changes, right bundle branch block    Assessment/Plan Principal Problem:   Near syncope Active Problems:   A-fib [facility]), new onset [facility] hospital 12/25   Abscess in epidural space of lumbar spine   Discitis   Hematoma of right flank   Hyperlipidemia   Near syncope Continue telemetry monitoring Add parameters to hold for heart exam.  hypotension and bradycardia Order 2-D echo Check orthostatic vital signs  Anemia, acute blood loss Continue to monitor H&H and hematoma size Patient will be given transfusion of 2 units of PRBC Hold Xarelto  Atrial fibrillation with controlled heart rate Continue to hold Xarelto Continue telemetry monitoring Patient might need cardiology consult while admitted  Abscess of an epidural space of lumbar spine Continue vancomycin and Rocephin at the previous doses  Hyperlipidemia Continue statin therapy  DVT prophylaxis: None Code Status: full Family Communication: wife Disposition Plan: telemetry unit Consults called: none Admission status: inpatient   York Grice, [place] Pager: [phone] [facility]  If 7PM-7AM, please contact night-coverage [email or web address] Password [facility]  11/28/2016, 5:43 PM

## 2016-11-28 NOTE — ED Notes (Signed)
Patient handed an urinal; patient does not need to use it at this time but was just wanting to have one handy just in case; visitor at bedside

## 2016-11-29 ENCOUNTER — Inpatient Hospital Stay (HOSPITAL_COMMUNITY): Payer: Medicare Other

## 2016-11-29 DIAGNOSIS — I5030 Unspecified diastolic (congestive) heart failure: Secondary | ICD-10-CM

## 2016-11-29 DIAGNOSIS — S301XXD Contusion of abdominal wall, subsequent encounter: Secondary | ICD-10-CM | POA: Diagnosis not present

## 2016-11-29 DIAGNOSIS — R55 Syncope and collapse: Secondary | ICD-10-CM | POA: Diagnosis not present

## 2016-11-29 DIAGNOSIS — I130 Hypertensive heart and chronic kidney disease with heart failure and stage 1 through stage 4 chronic kidney disease, or unspecified chronic kidney disease: Secondary | ICD-10-CM | POA: Diagnosis not present

## 2016-11-29 DIAGNOSIS — I4891 Unspecified atrial fibrillation: Secondary | ICD-10-CM | POA: Diagnosis not present

## 2016-11-29 DIAGNOSIS — Z87891 Personal history of nicotine dependence: Secondary | ICD-10-CM | POA: Diagnosis not present

## 2016-11-29 DIAGNOSIS — D62 Acute posthemorrhagic anemia: Secondary | ICD-10-CM | POA: Diagnosis not present

## 2016-11-29 DIAGNOSIS — G061 Intraspinal abscess and granuloma: Secondary | ICD-10-CM | POA: Diagnosis not present

## 2016-11-29 DIAGNOSIS — R5381 Other malaise: Secondary | ICD-10-CM | POA: Diagnosis not present

## 2016-11-29 DIAGNOSIS — Z79899 Other long term (current) drug therapy: Secondary | ICD-10-CM | POA: Diagnosis not present

## 2016-11-29 DIAGNOSIS — E860 Dehydration: Secondary | ICD-10-CM | POA: Diagnosis not present

## 2016-11-29 DIAGNOSIS — S301XXA Contusion of abdominal wall, initial encounter: Secondary | ICD-10-CM | POA: Diagnosis not present

## 2016-11-29 DIAGNOSIS — E785 Hyperlipidemia, unspecified: Secondary | ICD-10-CM | POA: Diagnosis not present

## 2016-11-29 DIAGNOSIS — Z96651 Presence of right artificial knee joint: Secondary | ICD-10-CM | POA: Diagnosis not present

## 2016-11-29 DIAGNOSIS — N183 Chronic kidney disease, stage 3 (moderate): Secondary | ICD-10-CM | POA: Diagnosis not present

## 2016-11-29 DIAGNOSIS — Z7901 Long term (current) use of anticoagulants: Secondary | ICD-10-CM | POA: Diagnosis not present

## 2016-11-29 LAB — BASIC METABOLIC PANEL
ANION GAP: 9 (ref 5–15)
BUN: 16 mg/dL (ref 6–20)
CHLORIDE: 101 mmol/L (ref 101–111)
CO2: 25 mmol/L (ref 22–32)
Calcium: 8.4 mg/dL — ABNORMAL LOW (ref 8.9–10.3)
Creatinine, Ser: 1.27 mg/dL — ABNORMAL HIGH (ref 0.61–1.24)
GFR calc Af Amer: 56 mL/min — ABNORMAL LOW (ref >60)
GFR calc non Af Amer: 49 mL/min — ABNORMAL LOW (ref >60)
Glucose, Bld: 121 mg/dL — ABNORMAL HIGH (ref 65–99)
POTASSIUM: 4.2 mmol/L (ref 3.5–5.1)
Sodium: 135 mmol/L (ref 135–145)

## 2016-11-29 LAB — CBC
HEMATOCRIT: 28.8 % — AB (ref 39.0–52.0)
HEMOGLOBIN: 9.3 g/dL — AB (ref 13.0–17.0)
MCH: 28.8 pg (ref 26.0–34.0)
MCHC: 32.3 g/dL (ref 30.0–36.0)
MCV: 89.2 fL (ref 78.0–100.0)
Platelets: 183 10*3/uL (ref 150–400)
RBC: 3.23 MIL/uL — AB (ref 4.22–5.81)
RDW: 15.4 % (ref 11.5–15.5)
WBC: 11.6 10*3/uL — ABNORMAL HIGH (ref 4.0–10.5)

## 2016-11-29 LAB — ECHOCARDIOGRAM COMPLETE
Height: 70 in
Weight: 3428.59 oz

## 2016-11-29 MED ORDER — FUROSEMIDE 10 MG/ML IJ SOLN
40.0000 mg | Freq: Every day | INTRAMUSCULAR | Status: DC
Start: 2016-11-29 — End: 2016-12-01
  Administered 2016-11-29 – 2016-11-30 (×2): 40 mg via INTRAVENOUS
  Filled 2016-11-29 (×2): qty 4

## 2016-11-29 NOTE — Progress Notes (Signed)
Pharmacy Antibiotic Note  Andrew Salas is a 83 y.o. male admitted on 11/28/2016 with syncope.  Pharmacy has been consulted to continue vancomycin and ceftriaxone from PTA for treatment of epidural lumbar abscess. Per previous admission, ID recommended using 11/13/16 as day 1 of abx treatment, to continue for 6 weeks.   Plan: Continue vancomycin 1250mg  IV q24h Continue ceftriaxone 2g IV q12h Will obtain vanc trough tomorrow Monitor renal function   Height: 5\' 10"  (177.8 cm) Weight: 214 lb 4.6 oz (97.2 kg) (bed scale) IBW/kg (Calculated) : 73  Temp (24hrs), Avg:98.3 F (36.8 C), Min:97.4 F (36.3 C), Max:99.4 F (37.4 C)   Recent Labs Lab 11/28/16 1348 11/28/16 2037  WBC 11.7* 11.5*  CREATININE 1.32*  --     Estimated Creatinine Clearance: 45.2 mL/min (by C-G formula based on SCr of 1.32 mg/dL (H)).    No Known Allergies  Antimicrobials this admission: 12/29 vanc>>(2/5) 12/29 CTX>>(2/5)  Dose adjustments this admission: n/a  Microbiology results: 12/28 BCx: negative 12/29 Abscess cx: negative  Thank you for allowing pharmacy to be a part of this patient's care.   Mackie Paienee Marise Knapper, PharmD PGY1 Pharmacy Resident Pager: [phone] 11/29/2016 9:43 AM

## 2016-11-29 NOTE — Progress Notes (Signed)
  Echocardiogram 2D Echocardiogram has been performed.  Arvil ChacoFoster, [place] Proby 11/29/2016, 10:32 AM

## 2016-11-29 NOTE — Progress Notes (Signed)
PROGRESS NOTE [facility]   Jos Cygan   BJY:[id number] DOB: 04-10-1926  DOA: 11/28/2016 PCP: Paulina Fusi, MD   Brief Narrative:  83 year old male with medical history significant for hypertension, osteoarthritis, chronic back pain - status post epidural injection on 11/04/2016 which resulted in epidural and paraspinal abscess. He was recently discharged from [facility] on IV antibiotic to continue a total of 6 weeks of vancomycin and Rocephin. Patient came complaining of bilateral lower extremity weakness after he stood up from his recliner he started to fall and the next thing that he remembers he was on the floor near his recliner. This was not witnessed but he was found by his wife and the floor. Patient reported that he's been having persistent right hip pain.  CT abd/pelvis was found to have right gluteus maximus hematoma with dense soft tissue contusion and swelling of the right greater trochanteric bursa. Patient was also found to be anemic with hemoglobin of 7.8 a drop from 10.1 since January 3.  Patient admitted for blood transfusion and syncope workup.   Subjective: Patient seen and examined with wife at bedside, patient report significant improvement. Patient report bilateral lower extremity edema which is new for him. No other complaints.  Assessment & Plan: Near syncope - highly secondary to anemia, possible dehydration ECHO - EF 50-55%  Get Orthostatic vitals  CT head negative for acute abnormalities   Anemia, blood loss - likely due to hematoma, FOB negative  S/p 2 unit of PRBC  Hold Xarelto  Monitor CBC  Check anemia panel   Afib HR well controlled  Continue Cardizem  Holding Xarelto due to hematoma  Monitor on Tele   Abscess epidural and paraspinal  Continue abx per pharmacy recs - Zenaida Niece and Rocephin   HFpEF - Hx of SOB on exertions recently diagnosed with Afib  ECHO 50-55%  2+ pitting edema B/L  IV lasix  Monitor I&Os Daily  weights   DVT prophylaxis: SCD's  Code Status: FULL  Family Communication: Wife at bedside  Disposition Plan: Likely home when medically stable   Consultants:   None   Procedures:   ECHO 11/29/16 ------------------------------------------------------------------- Study Conclusions  - Left ventricle: The cavity size was normal. Wall thickness was   increased in a pattern of severe LVH. Systolic function was   normal. The estimated ejection fraction was in the range of 50%   to 55%. Wall motion was normal; there were no regional wall   motion abnormalities. The study is not technically sufficient to   allow evaluation of LV diastolic function. - Aortic valve: Mildly calcified annulus. Trileaflet; mildly   thickened leaflets. Valve area (VTI): 2.17 cm^2. Valve area   (Vmax): 2.28 cm^2. Valve area (Vmean): 2.46 cm^2. - Aorta: The visualized portion of the proximal ascending aorta is   mildly dilated at 4.1 cm. - Mitral valve: Mildly calcified annulus. Normal thickness leaflets   . - Left atrium: The atrium was severely dilated. - Right atrium: The atrium was moderately dilated. - Pulmonary arteries: Systolic pressure was moderately increased.   PA peak pressure: 44 mm Hg (s).  Antimicrobials:  None    Objective: Vitals:   11/29/16 0134 11/29/16 0450 11/29/16 0530 11/29/16 0741  BP: 135/65 (!) 147/79 (!) 150/92 125/75  Pulse: 84 89 65 65  Resp: (!) 22 20 (!) 22 18  Temp: 98.6 F (37 C) 98.1 F (36.7 C) 98.3 F (36.8 C) 98.6 F (37 C)  TempSrc: Oral Oral Oral Oral  SpO2:  94% 94% 100% 95%  Weight:      Height:        Intake/Output Summary (Last 24 hours) at 11/29/16 0855 Last data filed at 11/29/16 0741  Gross per 24 hour  Intake             1622 ml  Output              876 ml  Net              746 ml   Filed Weights   11/28/16 1821  Weight: 97.2 kg (214 lb 4.6 oz)    Examination:  General exam: Appears calm and comfortable. Pale  Respiratory system:  Clear to auscultation. No wheezes,crackle or rhonchi Cardiovascular system: S1S2 Irr Irr, 2/6 SM - no rubs, no JVD  Gastrointestinal system: Abdomen is nondistended, soft and nontender. Normal bowel sounds heard. Central nervous system: Alert and oriented. Extremities: 2+ pedal edema. Strength 5/5   Skin: No rashes, lesions or ulcers Psychiatry: Judgement and insight appear normal. Mood & affect appropriate.   Data Reviewed: I have personally reviewed following labs and imaging studies  CBC:  Recent Labs Lab 11/28/16 1348 11/28/16 2037  WBC 11.7* 11.5*  NEUTROABS 10.1*  --   HGB 7.8* 7.3*  HCT 24.8* 23.2*  MCV 89.9 91.3  PLT 215 209   Basic Metabolic Panel:  Recent Labs Lab 11/28/16 1348  NA 135  K 4.0  CL 101  CO2 24  GLUCOSE 117*  BUN 17  CREATININE 1.32*  CALCIUM 8.3*   GFR: Estimated Creatinine Clearance: 45.2 mL/min (by C-G formula based on SCr of 1.32 mg/dL (H)). Liver Function Tests: No results for input(s): AST, ALT, ALKPHOS, BILITOT, PROT, ALBUMIN in the last 168 hours. No results for input(s): LIPASE, AMYLASE in the last 168 hours. No results for input(s): AMMONIA in the last 168 hours. Coagulation Profile:  Recent Labs Lab 11/28/16 1348  INR 1.93   Cardiac Enzymes: No results for input(s): CKTOTAL, CKMB, CKMBINDEX, TROPONINI in the last 168 hours. BNP (last 3 results) No results for input(s): PROBNP in the last 8760 hours. HbA1C: No results for input(s): HGBA1C in the last 72 hours. CBG: No results for input(s): GLUCAP in the last 168 hours. Lipid Profile: No results for input(s): CHOL, HDL, LDLCALC, TRIG, CHOLHDL, LDLDIRECT in the last 72 hours. Thyroid Function Tests: No results for input(s): TSH, T4TOTAL, FREET4, T3FREE, THYROIDAB in the last 72 hours. Anemia Panel: No results for input(s): VITAMINB12, FOLATE, FERRITIN, TIBC, IRON, RETICCTPCT in the last 72 hours. Sepsis Labs: No results for input(s): PROCALCITON, LATICACIDVEN in the last  168 hours.  No results found for this or any previous visit (from the past 240 hour(s)).    Radiology Studies: Ct Pelvis Wo Contrast  Result Date: 11/28/2016 CLINICAL DATA:  Pt fell against wood part of sofa, c/o right hip pain. Bruising noted right flank, hip, and anterior upper thigh. EXAM: CT PELVIS WITHOUT CONTRAST TECHNIQUE: Multidetector CT imaging of the pelvis was performed following the standard protocol without intravenous contrast. COMPARISON:  11/08/2016 FINDINGS: There is enlargement of the anterior aspect of the right gluteus maximus consistent with a hematoma. There is patchy and linear opacity in the adjacent fat consistent with additional hemorrhage/edema. There is fluid attenuation adjacent to the right greater trochanter consistent with bursal fluid. No acute findings within the pelvis. There are significant aortic and iliac vessel vascular calcifications. Prostate gland is mildly enlarged. No pelvic adenopathy or masses. Visualized bowel is  unremarkable. No abnormality of the bladder. No fracture. Mild bilateral hip joint degenerative changes noted. There are chronic pars defects at L4-L5 with a grade 1 anterolisthesis IMPRESSION: 1. No fracture or dislocation. 2. Right gluteus maximus hematoma with adjacent soft tissue contusion/edema. There is also fluid distending the right greater trochanteric bursa. 3. No other acute abnormality. Electronically Signed   By: Amie Portland M.D.   On: 11/28/2016 16:41   Dg Hip Unilat With Pelvis 2-3 Views Right  Result Date: 11/28/2016 CLINICAL DATA:  Onset yesterday 5pm pt fell against wood part of sofa, c/o right hip pain. Bruising noted right flank, hip, and anterior upper thigh. Hx right distal femur and knee surgery. EXAM: DG HIP (WITH OR WITHOUT PELVIS) 2-3V RIGHT COMPARISON:  None. FINDINGS: No acute fracture.  No dislocation.  No bone lesion. There is a smooth defect along the right iliac wing consistent with a previous bone graft donor  site. Hip joints, SI joints and symphysis pubis are normally aligned. Bones are demineralized. There are aortoiliac and femoral vessel vascular calcifications. Soft tissues are otherwise unremarkable. IMPRESSION: 1. No acute fracture or dislocation. Electronically Signed   By: Amie Portland M.D.   On: 11/28/2016 12:46    Scheduled Meds: . cefTRIAXone (ROCEPHIN)  IV  2 g Intravenous Q12H  . cycloSPORINE  1 drop Both Eyes BID  . diltiazem  240 mg Oral Daily  . furosemide  40 mg Intravenous Daily  . latanoprost  1 drop Both Eyes QHS  . multivitamin  1 tablet Oral BID  . pravastatin  40 mg Oral Daily  . sodium chloride flush  3 mL Intravenous Q12H  . sodium chloride flush  3 mL Intravenous Q12H  . vancomycin (VANCOCIN) 1250 mg/217mL IVPB  1,250 mg Intravenous Q24H   Continuous Infusions:   LOS: 1 day    Latrelle Dodrill, MD [facility] Pager [phone]  If 7PM-7AM, please contact night-coverage [email or web address] Password [facility] 11/29/2016, 8:55 AM

## 2016-11-30 DIAGNOSIS — R55 Syncope and collapse: Secondary | ICD-10-CM | POA: Diagnosis not present

## 2016-11-30 DIAGNOSIS — N183 Chronic kidney disease, stage 3 (moderate): Secondary | ICD-10-CM | POA: Diagnosis not present

## 2016-11-30 DIAGNOSIS — Z87891 Personal history of nicotine dependence: Secondary | ICD-10-CM | POA: Diagnosis not present

## 2016-11-30 DIAGNOSIS — I5030 Unspecified diastolic (congestive) heart failure: Secondary | ICD-10-CM | POA: Diagnosis not present

## 2016-11-30 DIAGNOSIS — Z7901 Long term (current) use of anticoagulants: Secondary | ICD-10-CM | POA: Diagnosis not present

## 2016-11-30 DIAGNOSIS — Z96651 Presence of right artificial knee joint: Secondary | ICD-10-CM | POA: Diagnosis not present

## 2016-11-30 DIAGNOSIS — G061 Intraspinal abscess and granuloma: Secondary | ICD-10-CM | POA: Diagnosis not present

## 2016-11-30 DIAGNOSIS — E860 Dehydration: Secondary | ICD-10-CM | POA: Diagnosis not present

## 2016-11-30 DIAGNOSIS — I4891 Unspecified atrial fibrillation: Secondary | ICD-10-CM | POA: Diagnosis not present

## 2016-11-30 DIAGNOSIS — S301XXA Contusion of abdominal wall, initial encounter: Secondary | ICD-10-CM | POA: Diagnosis not present

## 2016-11-30 DIAGNOSIS — I130 Hypertensive heart and chronic kidney disease with heart failure and stage 1 through stage 4 chronic kidney disease, or unspecified chronic kidney disease: Secondary | ICD-10-CM | POA: Diagnosis not present

## 2016-11-30 DIAGNOSIS — R5381 Other malaise: Secondary | ICD-10-CM | POA: Diagnosis not present

## 2016-11-30 DIAGNOSIS — E785 Hyperlipidemia, unspecified: Secondary | ICD-10-CM | POA: Diagnosis not present

## 2016-11-30 DIAGNOSIS — Z79899 Other long term (current) drug therapy: Secondary | ICD-10-CM | POA: Diagnosis not present

## 2016-11-30 DIAGNOSIS — D62 Acute posthemorrhagic anemia: Secondary | ICD-10-CM | POA: Diagnosis not present

## 2016-11-30 LAB — CBC
HEMATOCRIT: 28 % — AB (ref 39.0–52.0)
Hemoglobin: 9.1 g/dL — ABNORMAL LOW (ref 13.0–17.0)
MCH: 29 pg (ref 26.0–34.0)
MCHC: 32.5 g/dL (ref 30.0–36.0)
MCV: 89.2 fL (ref 78.0–100.0)
Platelets: 181 10*3/uL (ref 150–400)
RBC: 3.14 MIL/uL — ABNORMAL LOW (ref 4.22–5.81)
RDW: 16.1 % — AB (ref 11.5–15.5)
WBC: 11 10*3/uL — ABNORMAL HIGH (ref 4.0–10.5)

## 2016-11-30 LAB — GLUCOSE, CAPILLARY: Glucose-Capillary: 109 mg/dL — ABNORMAL HIGH (ref 65–99)

## 2016-11-30 LAB — BASIC METABOLIC PANEL
ANION GAP: 9 (ref 5–15)
BUN: 17 mg/dL (ref 6–20)
CALCIUM: 8.6 mg/dL — AB (ref 8.9–10.3)
CO2: 27 mmol/L (ref 22–32)
CREATININE: 1.29 mg/dL — AB (ref 0.61–1.24)
Chloride: 102 mmol/L (ref 101–111)
GFR calc Af Amer: 55 mL/min — ABNORMAL LOW (ref >60)
GFR calc non Af Amer: 48 mL/min — ABNORMAL LOW (ref >60)
GLUCOSE: 114 mg/dL — AB (ref 65–99)
Potassium: 3.7 mmol/L (ref 3.5–5.1)
Sodium: 138 mmol/L (ref 135–145)

## 2016-11-30 NOTE — Progress Notes (Addendum)
PROGRESS NOTE [facility]   Leavy Heatherly   LKG:[id number] DOB: 06-Mar-1926  DOA: 11/28/2016 PCP: Paulina Fusi, MD   Brief Narrative:  83 year old male with medical history significant for hypertension, osteoarthritis, chronic back pain - status post epidural injection on 11/04/2016 which resulted in epidural and paraspinal abscess. He was recently discharged from [facility] on IV antibiotic to continue a total of 6 weeks of vancomycin and Rocephin. Patient came complaining of bilateral lower extremity weakness after he stood up from his recliner he started to fall and the next thing that he remembers he was on the floor near his recliner. This was not witnessed but he was found by his wife and the floor. Patient reported that he's been having persistent right hip pain.  CT abd/pelvis was found to have right gluteus maximus hematoma with dense soft tissue contusion and swelling of the right greater trochanteric bursa. Patient was also found to be anemic with hemoglobin of 7.8 a drop from 10.1 since January 3.  Patient admitted for blood transfusion and syncope workup.   Subjective: Patient continues to improved, he c/o of R hip pain which has gotten better since admission. Leg edema decreasing. No new complaints at this time.   Assessment & Plan: Near syncope - likely secondary to anemia, possible dehydration ECHO - EF 50-55%  Get Orthostatic vitals  CT head negative for acute abnormalities   Anemia, blood loss - likely due to hematoma, FOB negative  S/p 2 unit of PRBC  Hold Xarelto  Monitor CBC  Check anemia panel   Afib  remains in Afib but HR well controlled Continue Cardizem  Holding Xarelto due to hematoma  Monitor on Tele   Abscess epidural and paraspinal  Continue abx per pharmacy recs - Zenaida Niece and Rocephin   HFpEF - Hx of SOB on exertions recently diagnosed with Afib and initially 2+ pitting edema B/L after transfusion  ECHO 50-55%  Continue IV  lasix  Monitor I&Os Daily weights   CKD stage III Cr stable at baseline  Monitor while on IV lasix   DVT prophylaxis: SCD's  Code Status: FULL  Family Communication: Wife at bedside  Disposition Plan: Likely home 1-2 days   Consultants:   None   Procedures:   ECHO 11/29/16 ------------------------------------------------------------------- Study Conclusions  - Left ventricle: The cavity size was normal. Wall thickness was   increased in a pattern of severe LVH. Systolic function was   normal. The estimated ejection fraction was in the range of 50%   to 55%. Wall motion was normal; there were no regional wall   motion abnormalities. The study is not technically sufficient to   allow evaluation of LV diastolic function. - Aortic valve: Mildly calcified annulus. Trileaflet; mildly   thickened leaflets. Valve area (VTI): 2.17 cm^2. Valve area   (Vmax): 2.28 cm^2. Valve area (Vmean): 2.46 cm^2. - Aorta: The visualized portion of the proximal ascending aorta is   mildly dilated at 4.1 cm. - Mitral valve: Mildly calcified annulus. Normal thickness leaflets   . - Left atrium: The atrium was severely dilated. - Right atrium: The atrium was moderately dilated. - Pulmonary arteries: Systolic pressure was moderately increased.   PA peak pressure: 44 mm Hg (s).  Antimicrobials:  None    Objective: Vitals:   11/29/16 1225 11/29/16 1954 11/30/16 0530 11/30/16 1230  BP: 135/85 (!) 143/68 (!) 132/59 (!) 148/68  Pulse: 98 90 91 84  Resp: 20 19 20 20   Temp: 97.7 F (36.5  C) 98 F (36.7 C) 97.6 F (36.4 C) 98.4 F (36.9 C)  TempSrc: Oral Oral Oral Oral  SpO2: 98% 97% 100% 98%  Weight:   95.4 kg (210 lb 6.4 oz)   Height:        Intake/Output Summary (Last 24 hours) at 11/30/16 1505 Last data filed at 11/30/16 1354  Gross per 24 hour  Intake             1200 ml  Output             2352 ml  Net            -1152 ml   Filed Weights   11/28/16 1821 11/30/16 0530  Weight:  97.2 kg (214 lb 4.6 oz) 95.4 kg (210 lb 6.4 oz)    Examination:  General exam: NAD Respiratory system: CTA no wheezing or crackle  Cardiovascular system: S1S2 Irr Irr, 2/6 SM - no rubs, no JVD  Central nervous system: Alert and oriented. Extremities: 1+ pedal edema. Strength 5/5   Skin: No rashes, lesions or ulcers  Data Reviewed: I have personally reviewed following labs and imaging studies  CBC:  Recent Labs Lab 11/28/16 1348 11/28/16 2037 11/29/16 0903 11/30/16 0329  WBC 11.7* 11.5* 11.6* 11.0*  NEUTROABS 10.1*  --   --   --   HGB 7.8* 7.3* 9.3* 9.1*  HCT 24.8* 23.2* 28.8* 28.0*  MCV 89.9 91.3 89.2 89.2  PLT 215 209 183 181   Basic Metabolic Panel:  Recent Labs Lab 11/28/16 1348 11/29/16 0903 11/30/16 0329  NA 135 135 138  K 4.0 4.2 3.7  CL 101 101 102  CO2 24 25 27   GLUCOSE 117* 121* 114*  BUN 17 16 17   CREATININE 1.32* 1.27* 1.29*  CALCIUM 8.3* 8.4* 8.6*   GFR: Estimated Creatinine Clearance: 45.9 mL/min (by C-G formula based on SCr of 1.29 mg/dL (H)). Liver Function Tests: No results for input(s): AST, ALT, ALKPHOS, BILITOT, PROT, ALBUMIN in the last 168 hours. No results for input(s): LIPASE, AMYLASE in the last 168 hours. No results for input(s): AMMONIA in the last 168 hours. Coagulation Profile:  Recent Labs Lab 11/28/16 1348  INR 1.93   Cardiac Enzymes: No results for input(s): CKTOTAL, CKMB, CKMBINDEX, TROPONINI in the last 168 hours. BNP (last 3 results) No results for input(s): PROBNP in the last 8760 hours. HbA1C: No results for input(s): HGBA1C in the last 72 hours. CBG:  Recent Labs Lab 11/30/16 0651  GLUCAP 109*   Lipid Profile: No results for input(s): CHOL, HDL, LDLCALC, TRIG, CHOLHDL, LDLDIRECT in the last 72 hours. Thyroid Function Tests: No results for input(s): TSH, T4TOTAL, FREET4, T3FREE, THYROIDAB in the last 72 hours. Anemia Panel: No results for input(s): VITAMINB12, FOLATE, FERRITIN, TIBC, IRON, RETICCTPCT in  the last 72 hours. Sepsis Labs: No results for input(s): PROCALCITON, LATICACIDVEN in the last 168 hours.  No results found for this or any previous visit (from the past 240 hour(s)).    Radiology Studies: Ct Pelvis Wo Contrast  Result Date: 11/28/2016 CLINICAL DATA:  Pt fell against wood part of sofa, c/o right hip pain. Bruising noted right flank, hip, and anterior upper thigh. EXAM: CT PELVIS WITHOUT CONTRAST TECHNIQUE: Multidetector CT imaging of the pelvis was performed following the standard protocol without intravenous contrast. COMPARISON:  11/08/2016 FINDINGS: There is enlargement of the anterior aspect of the right gluteus maximus consistent with a hematoma. There is patchy and linear opacity in the adjacent fat consistent with additional  hemorrhage/edema. There is fluid attenuation adjacent to the right greater trochanter consistent with bursal fluid. No acute findings within the pelvis. There are significant aortic and iliac vessel vascular calcifications. Prostate gland is mildly enlarged. No pelvic adenopathy or masses. Visualized bowel is unremarkable. No abnormality of the bladder. No fracture. Mild bilateral hip joint degenerative changes noted. There are chronic pars defects at L4-L5 with a grade 1 anterolisthesis IMPRESSION: 1. No fracture or dislocation. 2. Right gluteus maximus hematoma with adjacent soft tissue contusion/edema. There is also fluid distending the right greater trochanteric bursa. 3. No other acute abnormality. Electronically Signed   By: Amie Portlandavid  Ormond M.D.   On: 11/28/2016 16:41    Scheduled Meds: . cefTRIAXone (ROCEPHIN)  IV  2 g Intravenous Q12H  . cycloSPORINE  1 drop Both Eyes BID  . diltiazem  240 mg Oral Daily  . furosemide  40 mg Intravenous Daily  . latanoprost  1 drop Both Eyes QHS  . multivitamin  1 tablet Oral BID  . pravastatin  40 mg Oral Daily  . sodium chloride flush  3 mL Intravenous Q12H  . sodium chloride flush  3 mL Intravenous Q12H  .  vancomycin (VANCOCIN) 1250 mg/26250mL IVPB  1,250 mg Intravenous Q24H   Continuous Infusions:   LOS: 2 days    Latrelle DodrillEdwin Silva, MD [facility] Pager [phone]  If 7PM-7AM, please contact night-coverage [email or web address] Password [email or web address] 11/30/2016, 3:05 PM

## 2016-11-30 NOTE — Progress Notes (Signed)
Responded to consult for advanced directive  (AD). Pt, a [employer] vet, had completed and had not[facility]. However, that Dec. 27 AD did not state pt's current  desires for his Ad. We therefore engaged in careful explanation/diallog re: the meaning of all the Q's on the [facility] form. E.g., on p. 2 re: living will, pt specifically desires not to accept life-prolonging measures and to accept only tube-feeding. The prior form did not reflect pt's wishes in this regard..  Our discussion on this point included [employer] doctrine about such issues (esp. that life-prolonging measures past the time of what would be natural death are not required, and Ellen Henriope John Paul II himself died after a long illness w/o accepting a feeding tube) and cremation (which is allowed, but the ashes are to be buried together in a receptacle as a body would be buried, and not scattered).  Pt's prior Dec. 27 AD also listed only 1 healthcare agent, not the 2 he has now named..   After execution of pt's new AD today, we destroyed four copies of his prior Dec. 27 AD. Only the notarized AD dated today, 11/30/16, is now operative and reflects pt's wishes.   A copy if this new 11/30/16 AD was given to administrative staff for placement in pt's chart. The original and 2 copies were given to pt for him and his 2 healthcare agents. B/c pt is legally blind, his healthcare agent #1 [name], who was in the rm w/ him when I arrived), served as his scribe, completing the form as I discussed it w/ pt, recording the answers that pt told me were his wishes to each of the questions on the form.   In prior first visit today to ensure that  pt understood Q's on form and had capacity to complete it, also, provided spiritual/emotional support and prayer.Andrew Salas. Chaplain available for f/u.   11/30/16 1300  Clinical Encounter Type  Visited With Patient;Other (Comment) (friend)  Visit Type Initial;Follow-up;Psychological support;Spiritual  support;Social support  Referral From Nurse  Spiritual Encounters  Spiritual Needs Literature;Brochure;Prayer;Emotional  Stress Factors  Patient Stress Factors Health changes;Loss of control   Andrew Salas, [address]

## 2016-11-30 NOTE — Progress Notes (Signed)
[facility]  Mr. Daphine DeutscherMartin is an active pt with [facility] prior to this readmission.  [facility] is providing H[facility] and [facility] services for home IV ABX  [facility] to support home care services as ordered upon DC.    If patient discharges after hours, please call [phone]. h  Sedalia MutaPamela S Chandler 11/30/2016, 11:52 AM

## 2016-11-30 NOTE — Progress Notes (Signed)
Patient rested well overnight. Vitals remained stable. Patient currently in bed. Caregiver at the bedside.

## 2016-12-01 DIAGNOSIS — E860 Dehydration: Secondary | ICD-10-CM | POA: Diagnosis not present

## 2016-12-01 DIAGNOSIS — S301XXA Contusion of abdominal wall, initial encounter: Secondary | ICD-10-CM | POA: Diagnosis not present

## 2016-12-01 DIAGNOSIS — Z87891 Personal history of nicotine dependence: Secondary | ICD-10-CM | POA: Diagnosis not present

## 2016-12-01 DIAGNOSIS — R5381 Other malaise: Secondary | ICD-10-CM | POA: Diagnosis not present

## 2016-12-01 DIAGNOSIS — I4891 Unspecified atrial fibrillation: Secondary | ICD-10-CM | POA: Diagnosis not present

## 2016-12-01 DIAGNOSIS — R55 Syncope and collapse: Secondary | ICD-10-CM | POA: Diagnosis not present

## 2016-12-01 DIAGNOSIS — Z7901 Long term (current) use of anticoagulants: Secondary | ICD-10-CM | POA: Diagnosis not present

## 2016-12-01 DIAGNOSIS — S301XXD Contusion of abdominal wall, subsequent encounter: Secondary | ICD-10-CM | POA: Diagnosis not present

## 2016-12-01 DIAGNOSIS — D62 Acute posthemorrhagic anemia: Secondary | ICD-10-CM | POA: Diagnosis not present

## 2016-12-01 DIAGNOSIS — Z79899 Other long term (current) drug therapy: Secondary | ICD-10-CM | POA: Diagnosis not present

## 2016-12-01 DIAGNOSIS — I5030 Unspecified diastolic (congestive) heart failure: Secondary | ICD-10-CM | POA: Diagnosis not present

## 2016-12-01 DIAGNOSIS — I130 Hypertensive heart and chronic kidney disease with heart failure and stage 1 through stage 4 chronic kidney disease, or unspecified chronic kidney disease: Secondary | ICD-10-CM | POA: Diagnosis not present

## 2016-12-01 DIAGNOSIS — N183 Chronic kidney disease, stage 3 (moderate): Secondary | ICD-10-CM | POA: Diagnosis not present

## 2016-12-01 DIAGNOSIS — Z96651 Presence of right artificial knee joint: Secondary | ICD-10-CM | POA: Diagnosis not present

## 2016-12-01 DIAGNOSIS — E785 Hyperlipidemia, unspecified: Secondary | ICD-10-CM | POA: Diagnosis not present

## 2016-12-01 DIAGNOSIS — G061 Intraspinal abscess and granuloma: Secondary | ICD-10-CM | POA: Diagnosis not present

## 2016-12-01 LAB — CBC
HCT: 26.6 % — ABNORMAL LOW (ref 39.0–52.0)
HEMOGLOBIN: 8.6 g/dL — AB (ref 13.0–17.0)
MCH: 29.1 pg (ref 26.0–34.0)
MCHC: 32.3 g/dL (ref 30.0–36.0)
MCV: 89.9 fL (ref 78.0–100.0)
Platelets: 152 10*3/uL (ref 150–400)
RBC: 2.96 MIL/uL — AB (ref 4.22–5.81)
RDW: 16.4 % — ABNORMAL HIGH (ref 11.5–15.5)
WBC: 9.2 10*3/uL (ref 4.0–10.5)

## 2016-12-01 LAB — IRON AND TIBC
IRON: 38 ug/dL — AB (ref 45–182)
Saturation Ratios: 14 % — ABNORMAL LOW (ref 17.9–39.5)
TIBC: 276 ug/dL (ref 250–450)
UIBC: 238 ug/dL

## 2016-12-01 LAB — BASIC METABOLIC PANEL
ANION GAP: 9 (ref 5–15)
BUN: 19 mg/dL (ref 6–20)
CALCIUM: 8.4 mg/dL — AB (ref 8.9–10.3)
CO2: 27 mmol/L (ref 22–32)
Chloride: 99 mmol/L — ABNORMAL LOW (ref 101–111)
Creatinine, Ser: 1.26 mg/dL — ABNORMAL HIGH (ref 0.61–1.24)
GFR calc non Af Amer: 49 mL/min — ABNORMAL LOW (ref >60)
GFR, EST AFRICAN AMERICAN: 57 mL/min — AB (ref >60)
Glucose, Bld: 108 mg/dL — ABNORMAL HIGH (ref 65–99)
Potassium: 3.6 mmol/L (ref 3.5–5.1)
Sodium: 135 mmol/L (ref 135–145)

## 2016-12-01 LAB — RETICULOCYTES
RBC.: 3.08 MIL/uL — ABNORMAL LOW (ref 4.22–5.81)
Retic Count, Absolute: 80.1 10*3/uL (ref 19.0–186.0)
Retic Ct Pct: 2.6 % (ref 0.4–3.1)

## 2016-12-01 LAB — VITAMIN B12: VITAMIN B 12: 275 pg/mL (ref 180–914)

## 2016-12-01 LAB — GLUCOSE, CAPILLARY: GLUCOSE-CAPILLARY: 100 mg/dL — AB (ref 65–99)

## 2016-12-01 LAB — FERRITIN: Ferritin: 186 ng/mL (ref 24–336)

## 2016-12-01 LAB — FOLATE: FOLATE: 11.5 ng/mL (ref >5.9)

## 2016-12-01 MED ORDER — FERROUS SULFATE 325 (65 FE) MG PO TABS: 325.0000 mg | ORAL_TABLET | Freq: Three times a day (TID) | ORAL | 0 refills | Status: AC

## 2016-12-01 MED ORDER — CEFTRIAXONE IV (FOR PTA / DISCHARGE USE ONLY): 2.0000 g | Freq: Two times a day (BID) | INTRAVENOUS | 0 refills | Status: DC

## 2016-12-01 MED ORDER — FUROSEMIDE 40 MG PO TABS
40.0000 mg | ORAL_TABLET | Freq: Two times a day (BID) | ORAL | Status: DC
  Administered 2016-12-01: 40 mg via ORAL
  Filled 2016-12-01: qty 1

## 2016-12-01 MED ORDER — HEPARIN SOD (PORK) LOCK FLUSH 100 UNIT/ML IV SOLN
250.0000 [IU] | INTRAVENOUS | Status: AC | PRN
Start: 2016-12-01 — End: 2016-12-01
  Administered 2016-12-01: 250 [IU]

## 2016-12-01 MED ORDER — FUROSEMIDE 40 MG PO TABS: 40.0000 mg | ORAL_TABLET | Freq: Two times a day (BID) | ORAL | 0 refills | Status: AC

## 2016-12-01 MED ORDER — VANCOMYCIN IV (FOR PTA / DISCHARGE USE ONLY): 1250.0000 mg | INTRAVENOUS | 0 refills | Status: AC

## 2016-12-01 NOTE — Discharge Summary (Signed)
Physician Discharge Summary  Andrew Salas  DDU:[id number]  DOB: [birth year]  DOA: 11/28/2016 PCP: Andrew Dress, MD  Admit date: 11/28/2016 Discharge date: 12/01/2016  Admitted From: Home  Disposition:  Home   Recommendations for Outpatient Follow-up:  1. Follow up with PCP in 2-3 days 2. Please obtain BMP/CBC in one week 3. Please refer patient to cardiology  4. Please have patient evaluated by ID   Home Health: HH PT and Infusions  Equipment/Devices: Rolling walker   Discharge Condition: Improved  CODE STATUS: FULL   Diet recommendation: Heart Healthy / Carb Modified  Brief/Interim Summary: 83 year old male with medical history significant for hypertension, osteoarthritis, chronic back pain - status post epidural injection on 11/04/2016 which resulted in epidural and paraspinal abscess. He was recently discharged from [facility] on IV antibiotic to continue a total of 6 weeks of vancomycin and Rocephin. Patient came complaining of bilateral lower extremity weakness after he stood up from his recliner he started to fall and the next thing that he remembers he was on the floor near his recliner. This was not witnessed but he was found by his wife and the floor. Patient reported that he's been having persistent right hip pain.  CT abd/pelvis was found to have right gluteus maximus hematoma with dense soft tissue contusion and swelling of the right greater trochanteric bursa. Patient was also found to be anemic with hemoglobin of 7.8 a drop from 10.1 since January 3.  Patient was admitted for symptomatic anemia, PRBC were transfused and, patient also found to have peripheral edema. Patient was started on IV lasix with significant improvement. PT evaluated patient and recommended HH PT [name] now clinically stable and continues to improve, so he will be discharge home. Patient's chronic condition remained stable during the hospital stay.   Subjective: Patient seen and  examined feeling much better. Good diuresis. Tolerating diet well, up with PT whom recommended [facility] PT   Discharge Diagnoses/Hospital Course:  Near syncope - multifactorial, anemia, afib, dehydration, deconditioning  ECHO - EF 50-55%  CT head negative for acute abnormalities  HH PT for strenghtening   Anemia, blood loss -  due to hematoma from fall, FOB negative, a component of Iron deficiency as well  S/p 2 unit of PRBC  Hgb stable after transfusion  Will start iron 325 mg TID  Monitor CBC in 1 week  Follow up with PCP   Afib  remains in Afib but HR well controlled CHADSVACs 4  Continue Cardizem  Holding Xarelto 5-7 days due to hematoma Patient advised to hold a/c until seen by PCP  Patient need Cardiology follow up   Abscess epidural and paraspinal  PTA Rocephin and vancomycin - IV abx therapy ending on 12/21/16 Vancomycin renally adjusted - will continue 1,250 mg q daily  Keep Vancomycin trough 15-20 Continue Rocephin 2G every 12 hrs  Follow up with ID doctor   HFpEF - Hx of SOB on exertions recently diagnosed with Afib and initially 2+ pitting edema B/L after transfusion  ECHO 50-55%  Treated with IV lasix switched to PO  Will continue Lasix 40 mg PO BID  Daily weights  Low Salt diet  Recommended follow up with cardiology in 2-3 weeks   CKD stage III Cr stable at baseline    Discharge Instructions  You were cared for by a hospitalist during your hospital stay. If you have any questions about your discharge medications or the care you received while you were in the hospital after you  are discharged, you can call the unit and asked to speak with the hospitalist on call if the hospitalist that took care of you is not available. Once you are discharged, your primary care physician will handle any further medical issues. Please note that NO REFILLS for any discharge medications will be authorized once you are discharged, as it is imperative that you return to your primary  care physician (or establish a relationship with a primary care physician if you do not have one) for your aftercare needs so that they can reassess your need for medications and monitor your lab values.  Discharge Instructions    Call MD for:  difficulty breathing, headache or visual disturbances    Complete by:  As directed    Call MD for:  extreme fatigue    Complete by:  As directed    Call MD for:  hives    Complete by:  As directed    Call MD for:  persistant dizziness or light-headedness    Complete by:  As directed    Call MD for:  persistant nausea and vomiting    Complete by:  As directed    Call MD for:  redness, tenderness, or signs of infection (pain, swelling, redness, odor or green/yellow discharge around incision site)    Complete by:  As directed    Call MD for:  severe uncontrolled pain    Complete by:  As directed    Call MD for:  temperature >100.4    Complete by:  As directed    Diet - low sodium heart healthy    Complete by:  As directed    Discharge instructions    Complete by:  As directed    Home infusion instructions [facility] May follow [place] Dosing Protocol; May administer Cathflo as needed to maintain patency of vascular access device.; Flushing of vascular access device: per [facility] Protocol: 0.9% NaCl pre/post medica...    Complete by:  As directed    Instructions:  May follow [place] Dosing Protocol   Instructions:  May administer Cathflo as needed to maintain patency of vascular access device.   Instructions:  Flushing of vascular access device: per [facility] Protocol: 0.9% NaCl pre/post medication administration and prn patency; Heparin 100 u/ml, 61m for implanted ports and Heparin 10u/ml, 532mfor all other central venous catheters.   Instructions:  May follow [facility] Anaphylaxis Protocol for First Dose Administration in the home: 0.9% NaCl at 25-50 ml/hr to maintain IV access for protocol meds. Epinephrine 0.3 ml IV/IM PRN and Benadryl 25-50 IV/IM  PRN s/s of anaphylaxis.   Instructions:  [facility] Coordinator (RN) to assist per patient IV care needs in the home PRN.   Increase activity slowly    Complete by:  As directed      Allergies as of 12/01/2016   No Known Allergies     Medication List    STOP taking these medications   loperamide 2 MG capsule Commonly known as:  IMODIUM   rivaroxaban 20 MG Tabs tablet Commonly known as:  XARELTO     TAKE these medications   benazepril 40 MG tablet Commonly known as:  LOTENSIN Take 40 mg by mouth daily.   carboxymethylcellulose 0.5 % Soln Commonly known as:  REFRESH PLUS Place 1 drop into both eyes 3 (three) times daily as needed (for dryness).   cefTRIAXone IVPB Commonly known as:  ROCEPHIN Inject 2 g into the vein every 12 (twelve) hours.  Indication:  Lumbar epidural abscess Last Day of Therapy:  12/21/16 Labs - Once weekly:  CBC/D and BMP, Labs - Every other week:  ESR and CRP   cycloSPORINE 0.05 % ophthalmic emulsion Commonly known as:  RESTASIS Place 1 drop into both eyes 2 (two) times daily.   diclofenac sodium 1 % Gel Commonly known as:  VOLTAREN Apply 2 g topically 4 (four) times daily.   diltiazem 240 MG 24 hr capsule Commonly known as:  CARDIZEM CD Take 1 capsule (240 mg total) by mouth daily.   ferrous sulfate 325 (65 FE) MG tablet Commonly known as:  FERROUSUL Take 1 tablet (325 mg total) by mouth 3 (three) times daily with meals.   furosemide 40 MG tablet Commonly known as:  LASIX Take 1 tablet (40 mg total) by mouth 2 (two) times daily.   latanoprost 0.005 % ophthalmic solution Commonly known as:  XALATAN Place 1 drop into both eyes at bedtime.   pravastatin 40 MG tablet Commonly known as:  PRAVACHOL Take 40 mg by mouth daily.   PRESERVISION AREDS 2 PO Take 1 tablet by mouth 2 (two) times daily.   traMADol 50 MG tablet Commonly known as:  ULTRAM Take 50 mg by mouth 3 (three) times daily as needed for pain.   vancomycin  IVPB Inject 1,250 mg into the vein daily. Indication:  Lumbar epidural abscess Last Day of Therapy:  12/21/16 Labs - 'Sunday/Monday:  CBC/D, BMP, and vancomycin trough. Labs - Thursday:  BMP and vancomycin trough Labs - Every other week:  ESR and CRP            Home Infusion Instuctions        Start     Ordered   12/01/16 0000  Home infusion instructions [facility] May follow [facility] Dosing Protocol; May administer Cathflo as needed to maintain patency of vascular access device.; Flushing of vascular access device: per [facility] Protocol: 0.9% NaCl pre/post medica...    Question Answer Comment  Instructions May follow [facility] Dosing Protocol   Instructions May administer Cathflo as needed to maintain patency of vascular access device.   Instructions Flushing of vascular access device: per [facility] Protocol: 0.9% NaCl pre/post medication administration and prn patency; Heparin 100 u/ml, 5ml for implanted ports and Heparin 10u/ml, 5ml for all other central venous catheters.   Instructions May follow [facility] Anaphylaxis Protocol for First Dose Administration in the home: 0.9% NaCl at 25-50 ml/hr to maintain IV access for protocol meds. Epinephrine 0.3 ml IV/IM PRN and Benadryl 25-50 IV/IM PRN s/s of anaphylaxis.   Instructions [facility] Infusion Coordinator (RN) to assist per patient IV care needs in the home PRN.      12/01/16 1600       Durable Medical Equipment        Start     Ordered   12/01/16 1318  For home use only DME 3 n 1  Once     01' /16/18 1317     Follow-up Information    [place] [facility].   Why:  They will continue to do your home health care at your home Contact information: [address] [place] [id number] [phone]          No Known Allergies  Consultations:  None    Procedures/Studies: Dg Chest 2 View  Result Date: 11/14/2016 CLINICAL DATA:  Shortness of breath.  History of recent pneumonia. EXAM:  CHEST  2 VIEW COMPARISON:  11/13/2016 FINDINGS: The heart is enlarged but stable. Stable tortuosity and ectasia of the thoracic aorta. Persistent bibasilar infiltrates or atelectasis and small effusions. IMPRESSION: Persistent bibasilar process and small effusions. Stable cardiac enlargement.  No pulmonary edema. Electronically Signed   By: Marijo Sanes M.D.   On: 11/14/2016 08:58   Dg Chest 2 View  Result Date: 11/12/2016 CLINICAL DATA:  Discitis. EXAM: CHEST  2 VIEW COMPARISON:  11/06/2016 FINDINGS: Cardiomegaly and large hiatal hernia. Pulmonary vascular congestion and hazy basilar opacity that is likely atelectasis. Posttraumatic deformity of the left chest wall. Chronic upper mediastinal widening from ectatic vasculature based on recent chest CT. IMPRESSION: Cardiomegaly and pulmonary venous congestion. Intrathoracic stomach. Electronically Signed   By: Monte Fantasia M.D.   On: 11/12/2016 08:02   Ct Pelvis Wo Contrast  Result Date: 11/28/2016 CLINICAL DATA:  Pt fell against wood part of sofa, c/o right hip pain. Bruising noted right flank, hip, and anterior upper thigh. EXAM: CT PELVIS WITHOUT CONTRAST TECHNIQUE: Multidetector CT imaging of the pelvis was performed following the standard protocol without intravenous contrast. COMPARISON:  11/08/2016 FINDINGS: There is enlargement of the anterior aspect of the right gluteus maximus consistent with a hematoma. There is patchy and linear opacity in the adjacent fat consistent with additional hemorrhage/edema. There is fluid attenuation adjacent to the right greater trochanter consistent with bursal fluid. No acute findings within the pelvis. There are significant aortic and iliac vessel vascular calcifications. Prostate gland is mildly enlarged. No pelvic adenopathy or masses. Visualized bowel is unremarkable. No abnormality of the bladder. No fracture. Mild bilateral hip joint degenerative changes noted. There are chronic pars defects at L4-L5 with a  grade 1 anterolisthesis IMPRESSION: 1. No fracture or dislocation. 2. Right gluteus maximus hematoma with adjacent soft tissue contusion/edema. There is also fluid distending the right greater trochanteric bursa. 3. No other acute abnormality. Electronically Signed   By: Lajean Manes M.D.   On: 11/28/2016 16:41   Mr Lumbar Spine W Wo Contrast  Addendum Date: 11/12/2016   ADDENDUM REPORT: 11/12/2016 13:03 ADDENDUM: Study discussed by telephone with Dr. Nita Sells on 11/12/2016 At 1244 hours. Electronically Signed   By: Genevie Ann M.D.   On: 11/12/2016 13:03   Result Date: 11/12/2016 CLINICAL DATA:  83 year old male who underwent lumbar epidural steroid injection on 11/04/2016. Admitted to [facility] for acute encephalopathy. Lumbar puncture at [facility] on 11/10/2016 revealing cloudy CSF with debris suspicious for meningitis. Possible sepsis. Initial encounter. EXAM: MRI LUMBAR SPINE WITHOUT AND WITH CONTRAST TECHNIQUE: Multiplanar and multiecho pulse sequences of the lumbar spine were obtained without and with intravenous contrast. CONTRAST:  23m MULTIHANCE GADOBENATE DIMEGLUMINE 529 MG/ML IV SOLN [facility] CT Abdomen and Pelvis 11/08/2016. Lumbar MRI 07/23/2015 FINDINGS: Segmentation: Normal, which is the same numbering system on the 2016 MRI. Alignment: Stable since 2016 with grade 1 anterolisthesis of L4 on L5 and mildly exaggerated lumbar lordosis. Vertebrae: Mild chronic anterior endplate edema at L2 appears degenerative in nature. There is abnormal increased fluid in the bilateral L5-S1 and the right L4-L5 facets, see below. There is no definite posterior element marrow edema. Visible sacrum appears intact. Visible SI joints are within normal limits. Conus medullaris: Extends to the L1 level. There is no abnormal signal in the visible lower thoracic spinal cord or conus. See epidural abnormalities described below. No abnormal  intradural enhancement identified. Paraspinal and other soft tissues: Negative visualized abdominal viscera. Diffusely abnormal epidural space in the lumbar  spine from the superior L3 to the sacral level. Elongated posterior epidural fluid collection tracking from L4-L5 into the sacrum encompassing 11 x 15 x 75 mm (AP by transverse by CC, for an estimated volume of 6-7 mL). See series 9, image 7, series 3, image 7, series 8, image 29 and series 7, image 35. Mild associated mass effect on the posterior thecal sac. Associated abnormal posterior dural thickening and heterogeneous epidural space enhancement, maximal at L4 (series 9, image 7). There is also a superimposed smaller ventral epidural abscess primarily to the left of midline at L3 and L4 best seen on series 3, image 7 and series 7 image 21. It is possible there is a superimposed subdural fluid collection tracking as high as the tip of the conus (series 7, image 1) but this is not rim enhancing. There are posterior erector spinae muscle abscesses which are small and clustered and present more so to the right of midline. These are best demonstrated on series 6 images 3 through 6. The cluster of right side muscle abscess encompasses 24 x 22 x 69 mm (AP by transverse by CC, for an estimated volume of 18 mL). See series 9, image 2. This is in association with new right side L4-L5, L5-S1, and left side L5-S1 facet joint fluid suspicious. There is superimposed diffuse lumbar subcutaneous edema. Disc levels: The above epidural abnormality exacerbates multifactorial degenerative lumbar spinal stenosis from L3-L4 to L5-S1. Heterogeneous signal in the L1-L2 disc is chronic and appears degenerative in nature. IMPRESSION: 1. Positive for multifocal Lumbar Epidural and Paraspinal Abscesses from the L3 to the S2 level. - Ventral and dorsal epidural abscesses, most pronounced from L4 to S2. Associated lumbar dural thickening and enhancement. - Right greater than left erector  spinae muscle abscesses, encompassing 2 x 2 x 7 cm on the right from the L4 to the S1 level. 2. Suspect associated Septic Facet Joints on the right at L4-L5 and L5-S1, and on the left at L5-S1. 3. No overt lumbosacral osteomyelitis and no discitis suspected at this time. Electronically Signed: By: Genevie Ann M.D. On: 11/12/2016 12:40   Ct Aspiration  Result Date: 11/13/2016 CLINICAL DATA:  Lumbar epidural and paraspinal abscesses. EXAM: CT GUIDED ASPIRATION OF PARASPINAL ABSCESS ANESTHESIA/SEDATION: 1.0 mg IV Versed;  50 mcg IV Fentanyl Total Moderate Sedation Time:  15 minutes The patient's level of consciousness and physiologic status were continuously monitored during the procedure by Radiology nursing. PROCEDURE: The procedure, risks, benefits, and alternatives were explained to the patient. Questions regarding the procedure were encouraged and answered. The patient understands and consents to the procedure. A time out was performed prior to initiating the procedure. The lower lumbar paraspinal region was prepped with chlorhexidine in a sterile fashion, and a sterile drape was applied covering the operative field. A sterile gown and sterile gloves were used for the procedure. Local anesthesia was provided with 1% Lidocaine. CT was performed of the lower lumbosacral spine in a prone position. Under CT guidance, an 18 gauge spinal needle was advanced into right-sided lumbosacral paraspinal soft tissues. Aspiration was performed. Aspirate was combined with some sterile saline and sent for culture analysis. COMPLICATIONS: None FINDINGS: Aspiration was performed in the right posterior paraspinous musculature at the L5-S1 level. These yielded approximately 1 mL of purulent and bloody fluid. IMPRESSION: CT-guided aspiration at the level of right posterior paraspinous musculature at the L5-S1 level. Approximately 1 mL of purulent and bloody fluid was able to be aspirated. This was combined with  an additional 1 mL of  sterile saline and sent for culture analysis. Electronically Signed   By: Aletta Edouard M.D.   On: 11/13/2016 13:23   Dg Chest Port 1 View  Result Date: 11/17/2016 CLINICAL DATA:  PICC placement EXAM: PORTABLE CHEST 1 VIEW COMPARISON:  11/14/2016 FINDINGS: Right arm PICC tip in the mid SVC. Bibasilar airspace disease has progressed in the interval. No significant heart failure or edema. Small left pleural effusion IMPRESSION: Satisfactory PICC placement in the mid SVC Progression of bibasilar airspace disease. Electronically Signed   By: Franchot Gallo M.D.   On: 11/17/2016 13:54   Dg Chest Port 1 View  Result Date: 11/13/2016 CLINICAL DATA:  83 year old male with shortness of breath. Extensive acute lumbar spinal infection. Initial encounter. EXAM: PORTABLE CHEST 1 VIEW COMPARISON:  Chest radiographs 11/12/2016 and earlier. FINDINGS: Portable AP upright view at at 0950 hours. Stable cardiomegaly and mediastinal contours. Enlarged cardiac silhouette is in part related to large gastric hiatal hernia better demonstrated on the [facility] chest CT 11/06/2016. Veiling opacity at both lung bases. Confluent retrocardiac opacity. No pneumothorax. No superimposed pulmonary edema. Visualized tracheal air column is within normal limits. Calcified aortic atherosclerosis. IMPRESSION: Bilateral lower lobe collapse or consolidation and small bilateral pleural effusions are new since 11/06/2016. Calcified aortic atherosclerosis. Electronically Signed   By: Genevie Ann M.D.   On: 11/13/2016 10:12   Dg Hip Unilat With Pelvis 2-3 Views Right  Result Date: 11/28/2016 CLINICAL DATA:  Onset yesterday 5pm pt fell against wood part of sofa, c/o right hip pain. Bruising noted right flank, hip, and anterior upper thigh. Hx right distal femur and knee surgery. EXAM: DG HIP (WITH OR WITHOUT PELVIS) 2-3V RIGHT COMPARISON:  None. FINDINGS: No acute fracture.  No dislocation.  No bone lesion. There is a smooth defect along the  right iliac wing consistent with a previous bone graft donor site. Hip joints, SI joints and symphysis pubis are normally aligned. Bones are demineralized. There are aortoiliac and femoral vessel vascular calcifications. Soft tissues are otherwise unremarkable. IMPRESSION: 1. No acute fracture or dislocation. Electronically Signed   By: Lajean Manes M.D.   On: 11/28/2016 12:46   ECHO 11/29/16 ------------------------------------------------------------------- Study Conclusions  - Left ventricle: The cavity size was normal. Wall thickness was   increased in a pattern of severe LVH. Systolic function was   normal. The estimated ejection fraction was in the range of 50%   to 55%. Wall motion was normal; there were no regional wall   motion abnormalities. The study is not technically sufficient to   allow evaluation of LV diastolic function. - Aortic valve: Mildly calcified annulus. Trileaflet; mildly   thickened leaflets. Valve area (VTI): 2.17 cm^2. Valve area   (Vmax): 2.28 cm^2. Valve area (Vmean): 2.46 cm^2. - Aorta: The visualized portion of the proximal ascending aorta is   mildly dilated at 4.1 cm. - Mitral valve: Mildly calcified annulus. Normal thickness leaflets   . - Left atrium: The atrium was severely dilated. - Right atrium: The atrium was moderately dilated. - Pulmonary arteries: Systolic pressure was moderately increased.   PA peak pressure: 44 mm Hg (S).   Discharge Exam: Vitals:   12/01/16 0418 12/01/16 1352  BP: (!) 152/69 (!) 131/58  Pulse: 86 77  Resp: 20 12  Temp: 98.1 F (36.7 C) 98.7 F (37.1 C)   Vitals:   11/30/16 1230 11/30/16 2021 12/01/16 0418 12/01/16 1352  BP: (!) 148/68 (!) 145/77 (!) 152/69 (!) 131/58  Pulse: 84 85 86 77  Resp: '20 20 20 12  ' Temp: 98.4 F (36.9 C) 98.2 F (36.8 C) 98.1 F (36.7 C) 98.7 F (37.1 C)  TempSrc: Oral Oral Oral Oral  SpO2: 98% 99% 95% 96%  Weight:   95.9 kg (211 lb 6.4 oz)   Height:        General: Pt is  alert, awake, not in acute distress Cardiovascular: S1S2 Irr Irr 2/6 SM  Respiratory: CTA bilaterally, no wheezing, no rhonchi Abdominal: Soft, NT, ND, bowel sounds + Extremities: 1+ pitting pedal edema, no cyanosis    The results of significant diagnostics from this hospitalization (including imaging, microbiology, ancillary and laboratory) are listed below for reference.     Microbiology: No results found for this or any previous visit (from the past 240 hour(s)).   Labs: BNP (last 3 results) No results for input(s): BNP in the last 8760 hours. Basic Metabolic Panel:  Recent Labs Lab 11/28/16 1348 11/29/16 0903 11/30/16 0329 12/01/16 0355  NA 135 135 138 135  K 4.0 4.2 3.7 3.6  CL 101 101 102 99*  CO2 '24 25 27 27  ' GLUCOSE 117* 121* 114* 108*  BUN '17 16 17 19  ' CREATININE 1.32* 1.27* 1.29* 1.26*  CALCIUM 8.3* 8.4* 8.6* 8.4*   Liver Function Tests: No results for input(s): AST, ALT, ALKPHOS, BILITOT, PROT, ALBUMIN in the last 168 hours. No results for input(s): LIPASE, AMYLASE in the last 168 hours. No results for input(s): AMMONIA in the last 168 hours. CBC:  Recent Labs Lab 11/28/16 1348 11/28/16 2037 11/29/16 0903 11/30/16 0329 12/01/16 0355  WBC 11.7* 11.5* 11.6* 11.0* 9.2  NEUTROABS 10.1*  --   --   --   --   HGB 7.8* 7.3* 9.3* 9.1* 8.6*  HCT 24.8* 23.2* 28.8* 28.0* 26.6*  MCV 89.9 91.3 89.2 89.2 89.9  PLT 215 209 183 181 152   Cardiac Enzymes: No results for input(s): CKTOTAL, CKMB, CKMBINDEX, TROPONINI in the last 168 hours. BNP: Invalid input(s): POCBNP CBG:  Recent Labs Lab 11/30/16 0651 12/01/16 0642  GLUCAP 109* 100*   D-Dimer No results for input(s): DDIMER in the last 72 hours. Hgb A1c No results for input(s): HGBA1C in the last 72 hours. Lipid Profile No results for input(s): CHOL, HDL, LDLCALC, TRIG, CHOLHDL, LDLDIRECT in the last 72 hours. Thyroid function studies No results for input(s): TSH, T4TOTAL, T3FREE, THYROIDAB in the  last 72 hours.  Invalid input(s): FREET3 Anemia work up  Recent Labs  12/01/16 1230  VITAMINB12 275  FOLATE 11.5  FERRITIN 186  TIBC 276  IRON 38*  RETICCTPCT 2.6   Urinalysis    Component Value Date/Time   COLORURINE AMBER (A) 01/26/2012 1508   APPEARANCEUR CLOUDY (A) 01/26/2012 1508   LABSPEC 1.022 01/26/2012 1508   PHURINE 5.0 01/26/2012 1508   GLUCOSEU NEGATIVE 01/26/2012 1508   HGBUR NEGATIVE 01/26/2012 1508   BILIRUBINUR NEGATIVE 01/26/2012 1508   KETONESUR 15 (A) 01/26/2012 1508   PROTEINUR NEGATIVE 01/26/2012 1508   UROBILINOGEN 1.0 01/26/2012 1508   NITRITE NEGATIVE 01/26/2012 1508   LEUKOCYTESUR LARGE (A) 01/26/2012 1508   Sepsis Labs Invalid input(s): PROCALCITONIN,  WBC,  LACTICIDVEN Microbiology No results found for this or any previous visit (from the past 240 hour(s)).   Time coordinating discharge: Over 30 minutes  SIGNED:  Chipper Oman, MD  [facility] Hospitalists 12/01/2016, 4:39 PM Pager   If 7PM-7AM, please contact night-coverage [email or web address] Password [email or web address]

## 2016-12-01 NOTE — Progress Notes (Signed)
Patient has order for discharge. IV team has flushed and hep locked PICC line. Patient and caregiver, [place] SinkRita, were given discharge instructions and prescriptions for medications. Medication regimen reviewed; patient and caregiver verbalized understanding. Rocephin and vancomycin doses/times discussed with [place] Sinkita by [facility] nurse. [place] SinkRita notified that new script for iron tablets were sent to preferred pharmacy. Patient escorted off floor via wheelchair.

## 2016-12-01 NOTE — Evaluation (Signed)
Physical Therapy Evaluation Patient Details Name: Andrew Salas MRN: [id number] DOB: [birth year] Today's Date: 12/01/2016   History of Present Illness  83 yo male [employer] vet who is admitted after a spinal injection on 12/20 led to epidural abscess that was aspirated on 12/29, has not grown out more bacteria.  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  Clinical Impression  Pt was seen for evaluation of mobility and then to increase his tolerance for standing and gait.  He is demosntrating resting pulse of 95 then up to 126 with gait for second trip.  Talked with nurse about this to see what MD might want to do if anything.  Will follow acutely as needed for gait and balance work, then transition home and will pick back up as pt has previously been seen by HHPT.    Follow Up Recommendations Home health PT    Equipment Recommendations  Rolling walker with 5" wheels    Recommendations for Other Services       Precautions / Restrictions Precautions Precautions: Fall (telemetry) Restrictions Weight Bearing Restrictions: No      Mobility  Bed Mobility Overal bed mobility: Modified Independent                Transfers Overall transfer level: Modified independent Equipment used: Rolling walker (2 wheeled)             General transfer comment: PT supervised for safety given length ot time pt had been since up to walk  Ambulation/Gait Ambulation/Gait assistance: Min guard;Min assist Ambulation Distance (Feet): 70 Feet (30+40) Assistive device: Rolling walker (2 wheeled) Gait Pattern/deviations: Step-through pattern;Decreased stride length;Wide base of support;Trunk flexed;Drifts right/left Gait velocity: reduced Gait velocity interpretation: Below normal speed for age/gender General Gait Details: reminders for safety and to keep walker close  in transition to sit, and to set up to sit (uses a built up shoe on R foot)  Stairs            Wheelchair Mobility    Modified Rankin (Stroke Patients Only)       Balance Overall balance assessment: History of Falls   Sitting balance-Leahy Scale: Good       Standing balance-Leahy Scale: Fair                               Pertinent Vitals/Pain Pain Assessment: Faces Faces Pain Scale: Hurts little more Pain Location: R hip due to hematoma Pain Descriptors / Indicators: Aching Pain Intervention(s): Monitored during session;Repositioned    Home Living Family/patient expects to be discharged to:: Private residence Living Arrangements: Non-relatives/Friends Available Help at Discharge: Friend(s);Available 24 hours/day Type of Home: House Home Access: Stairs to enter Entrance Stairs-Rails: None Entrance Stairs-Number of Steps: 1 Home Layout: One level Home Equipment: Walker - 2 wheels;Walker - 4 wheels;Electric scooter;Cane - single point      Prior Function Level of Independence: Independent with assistive device(s)         Comments: rollator and power scooter are primary transportation     Hand Dominance        Extremity/Trunk Assessment   Upper Extremity Assessment Upper Extremity Assessment: Overall WFL for tasks assessed    Lower Extremity Assessment Lower Extremity Assessment: Generalized weakness    Cervical / Trunk Assessment Cervical / Trunk Assessment: Kyphotic  Communication  Communication: No difficulties  Cognition Arousal/Alertness: Awake/alert Behavior During Therapy: WFL for tasks assessed/performed Overall Cognitive Status: Within Functional Limits for tasks assessed                      General Comments General comments (skin integrity, edema, etc.): Pt has a friend who is a caregiver with him, presents her as his wife.      Exercises     Assessment/Plan    PT Assessment Patient needs continued PT  services  PT Problem List Decreased strength;Decreased range of motion;Decreased activity tolerance;Decreased balance;Decreased mobility;Decreased coordination;Decreased knowledge of use of DME;Decreased safety awareness;Cardiopulmonary status limiting activity;Obesity;Decreased skin integrity;Pain          PT Treatment Interventions Gait training;Stair training;Functional mobility training;Balance training;Therapeutic exercise;Therapeutic activities;Neuromuscular re-education;Patient/family education;DME instruction    PT Goals (Current goals can be found in the Care Plan section)  Acute Rehab PT Goals Patient Stated Goal: get home and feel better PT Goal Formulation: With patient/family Time For Goal Achievement: 12/15/16    Frequency Min 3X/week   Barriers to discharge   will need consistent help at home for supervising him as he is mildly unsteady and weak    Co-evaluation               End of Session Equipment Utilized During Treatment: Gait belt Activity Tolerance: Patient tolerated treatment well Patient left: in bed;with call bell/phone within reach;with family/visitor present;with nursing/sitter in room Nurse Communication: Mobility status         Time: 8119-1478 PT Time Calculation (min) (ACUTE ONLY): 26 min   Charges:   PT Evaluation $PT Eval Moderate Complexity: 1 Procedure PT Treatments $Gait Training: 8-22 mins   PT G Codes:        Ivar Drape December 16, 2016, 11:02 AM   Samul Dada, PT MS Acute Rehab Dept. Number: [facility] [id number] and [facility] [phone]

## 2016-12-01 NOTE — Progress Notes (Signed)
Per physical therapist, patient's HR increased to 120's with ambulation. No symptoms. MD made aware. No changes needed.

## 2016-12-02 LAB — TYPE AND SCREEN
ABO/RH(D): A POS
ANTIBODY SCREEN: NEGATIVE
Unit division: 0
Unit division: 0
Unit division: 0
Unit division: 0

## 2016-12-03 DIAGNOSIS — Z452 Encounter for adjustment and management of vascular access device: Secondary | ICD-10-CM | POA: Diagnosis not present

## 2016-12-03 DIAGNOSIS — M1711 Unilateral primary osteoarthritis, right knee: Secondary | ICD-10-CM | POA: Diagnosis not present

## 2016-12-03 DIAGNOSIS — Z792 Long term (current) use of antibiotics: Secondary | ICD-10-CM | POA: Diagnosis not present

## 2016-12-03 DIAGNOSIS — G061 Intraspinal abscess and granuloma: Secondary | ICD-10-CM | POA: Diagnosis not present

## 2016-12-03 DIAGNOSIS — A419 Sepsis, unspecified organism: Secondary | ICD-10-CM | POA: Diagnosis not present

## 2016-12-03 DIAGNOSIS — I4891 Unspecified atrial fibrillation: Secondary | ICD-10-CM | POA: Diagnosis not present

## 2016-12-03 DIAGNOSIS — G039 Meningitis, unspecified: Secondary | ICD-10-CM | POA: Diagnosis not present

## 2016-12-03 DIAGNOSIS — Z5181 Encounter for therapeutic drug level monitoring: Secondary | ICD-10-CM | POA: Diagnosis not present

## 2016-12-03 DIAGNOSIS — Z9861 Coronary angioplasty status: Secondary | ICD-10-CM | POA: Diagnosis not present

## 2016-12-08 DIAGNOSIS — R55 Syncope and collapse: Secondary | ICD-10-CM | POA: Diagnosis not present

## 2016-12-08 DIAGNOSIS — G061 Intraspinal abscess and granuloma: Secondary | ICD-10-CM | POA: Diagnosis not present

## 2016-12-08 DIAGNOSIS — I5032 Chronic diastolic (congestive) heart failure: Secondary | ICD-10-CM | POA: Diagnosis not present

## 2016-12-08 DIAGNOSIS — M179 Osteoarthritis of knee, unspecified: Secondary | ICD-10-CM | POA: Diagnosis not present

## 2016-12-08 DIAGNOSIS — G039 Meningitis, unspecified: Secondary | ICD-10-CM | POA: Diagnosis not present

## 2016-12-08 DIAGNOSIS — D509 Iron deficiency anemia, unspecified: Secondary | ICD-10-CM | POA: Diagnosis not present

## 2016-12-08 DIAGNOSIS — D62 Acute posthemorrhagic anemia: Secondary | ICD-10-CM | POA: Diagnosis not present

## 2016-12-08 DIAGNOSIS — Z96659 Presence of unspecified artificial knee joint: Secondary | ICD-10-CM | POA: Diagnosis not present

## 2016-12-08 DIAGNOSIS — I4891 Unspecified atrial fibrillation: Secondary | ICD-10-CM | POA: Diagnosis not present

## 2016-12-10 DIAGNOSIS — G061 Intraspinal abscess and granuloma: Secondary | ICD-10-CM | POA: Diagnosis not present

## 2016-12-10 DIAGNOSIS — G039 Meningitis, unspecified: Secondary | ICD-10-CM | POA: Diagnosis not present

## 2016-12-12 DIAGNOSIS — Z96659 Presence of unspecified artificial knee joint: Secondary | ICD-10-CM | POA: Diagnosis not present

## 2016-12-12 DIAGNOSIS — M179 Osteoarthritis of knee, unspecified: Secondary | ICD-10-CM | POA: Diagnosis not present

## 2016-12-12 DIAGNOSIS — G061 Intraspinal abscess and granuloma: Secondary | ICD-10-CM | POA: Diagnosis not present

## 2016-12-14 ENCOUNTER — Encounter: Payer: Self-pay | Admitting: Internal Medicine

## 2016-12-14 DIAGNOSIS — G061 Intraspinal abscess and granuloma: Secondary | ICD-10-CM | POA: Diagnosis not present

## 2016-12-14 DIAGNOSIS — G039 Meningitis, unspecified: Secondary | ICD-10-CM | POA: Diagnosis not present

## 2016-12-15 DIAGNOSIS — G061 Intraspinal abscess and granuloma: Secondary | ICD-10-CM | POA: Diagnosis not present

## 2016-12-16 DIAGNOSIS — I110 Hypertensive heart disease with heart failure: Secondary | ICD-10-CM | POA: Insufficient documentation

## 2016-12-16 DIAGNOSIS — I429 Cardiomyopathy, unspecified: Secondary | ICD-10-CM

## 2016-12-16 DIAGNOSIS — I452 Bifascicular block: Secondary | ICD-10-CM

## 2016-12-16 HISTORY — DX: Cardiomyopathy, unspecified: I42.9

## 2016-12-16 HISTORY — DX: Bifascicular block: I45.2

## 2016-12-16 HISTORY — DX: Hypertensive heart disease with heart failure: I11.0

## 2016-12-17 ENCOUNTER — Telehealth: Payer: Self-pay | Admitting: Pharmacist

## 2016-12-17 NOTE — Telephone Encounter (Signed)
[name]'s SCr has been trending up on Vanc + Rocephin for his epidural abscess.  His SCr trend is 1.43>1.60>1.25>1.89.  Stop date for abx is 2/5.  Will have him stop vancomycin and continue the Rocephin until 2/5.  He sees Dr. Orvan Falconerampbell on 2/6. Plan discussed with Dr. Orvan Falconerampbell. Melanie Crazieralled Lynn, [facility], and gave orders to d/c vanc.

## 2016-12-18 DIAGNOSIS — G061 Intraspinal abscess and granuloma: Secondary | ICD-10-CM | POA: Diagnosis not present

## 2016-12-22 ENCOUNTER — Encounter: Payer: Self-pay | Admitting: Internal Medicine

## 2016-12-22 ENCOUNTER — Ambulatory Visit (INDEPENDENT_AMBULATORY_CARE_PROVIDER_SITE_OTHER): Payer: Medicare Other | Admitting: Internal Medicine

## 2016-12-22 ENCOUNTER — Telehealth: Payer: Self-pay

## 2016-12-22 DIAGNOSIS — G061 Intraspinal abscess and granuloma: Secondary | ICD-10-CM | POA: Diagnosis not present

## 2016-12-22 MED ORDER — DOXYCYCLINE HYCLATE 100 MG PO TABS: 100.0000 mg | ORAL_TABLET | Freq: Two times a day (BID) | ORAL | 1 refills | Status: DC

## 2016-12-22 MED ORDER — CEFUROXIME AXETIL 250 MG PO TABS: 250.0000 mg | ORAL_TABLET | Freq: Two times a day (BID) | ORAL | 1 refills | Status: DC

## 2016-12-22 NOTE — Telephone Encounter (Signed)
Called and spoke with someone in pharmacy and informed them that today [name] will be getting labs and getting PICC removed. Pharmacist stated that she'd noted it in the nursing chart and would make sure it was taken care of. [facility] that [facility] comes after [facility] today, [facility] stated [facility] could come at requested time.

## 2016-12-22 NOTE — Assessment & Plan Note (Signed)
He is improving slowly but I would like to continue antibiotic therapy with a regimen of oral doxycycline and cefuroxime given the extent of the abscesses around his lower spine. I will stop ceftriaxone and have his PICC removed. His vancomycin was stopped last week after his creatinine started going up. He will have repeat blood work today. He will have a follow-up MRI in early March in follow-up here in 6-8 weeks.

## 2016-12-22 NOTE — Progress Notes (Signed)
[place] for Infectious Disease  Patient Active Problem List   Diagnosis Date Noted  . Abscess in epidural space of lumbar spine 11/17/2016    Priority: High  . Meningitis 11/11/2016    Priority: High  . Near syncope 11/28/2016  . Hematoma of right flank 11/28/2016  . Hyperlipidemia 11/28/2016  . Abnormal CXR   . Discitis   . PNA (pneumonia)   . A-fib [facility]), new onset [facility] hospital 12/25 11/11/2016  . Obesity hypoventilation syndrome ([place]), presumed 11/11/2016  . UTI (urinary tract infection) 11/11/2016  . Sepsis ([place]) 11/11/2016  . Osteoarthritis of right knee 02/05/2012    Class: End Stage    Patient's Medications  New Prescriptions   CEFUROXIME (CEFTIN) 250 MG TABLET    Take 1 tablet (250 mg total) by mouth 2 (two) times daily with a meal.   DOXYCYCLINE (VIBRA-TABS) 100 MG TABLET    Take 1 tablet (100 mg total) by mouth 2 (two) times daily.  Previous Medications   BENAZEPRIL (LOTENSIN) 40 MG TABLET    Take 40 mg by mouth daily.   CARBOXYMETHYLCELLULOSE (REFRESH PLUS) 0.5 % SOLN    Place 1 drop into both eyes 3 (three) times daily as needed (for dryness).    CYCLOSPORINE (RESTASIS) 0.05 % OPHTHALMIC EMULSION    Place 1 drop into both eyes 2 (two) times daily.   DICLOFENAC SODIUM (VOLTAREN) 1 % GEL    Apply 2 g topically 4 (four) times daily.   DILTIAZEM (CARDIZEM CD) 240 MG 24 HR CAPSULE    Take 1 capsule (240 mg total) by mouth daily.   FERROUS SULFATE (FERROUSUL) 325 (65 FE) MG TABLET    Take 1 tablet (325 mg total) by mouth 3 (three) times daily with meals.   FUROSEMIDE (LASIX) 40 MG TABLET    Take 1 tablet (40 mg total) by mouth 2 (two) times daily.   HYDROCODONE-ACETAMINOPHEN (NORCO/VICODIN) 5-325 MG TABLET       LATANOPROST (XALATAN) 0.005 % OPHTHALMIC SOLUTION    Place 1 drop into both eyes at bedtime.   MULTIPLE VITAMINS-MINERALS (PRESERVISION AREDS 2 PO)    Take 1 tablet by mouth 2 (two) times daily.   PRAVASTATIN (PRAVACHOL) 40 MG TABLET     Take 40 mg by mouth daily.   TRAMADOL (ULTRAM) 50 MG TABLET    Take 50 mg by mouth 3 (three) times daily as needed for pain.   VANCOMYCIN IVPB    Inject 1,250 mg into the vein daily. Indication:  Lumbar epidural abscess Last Day of Therapy:  12/21/16 Labs - Sunday/Monday:  CBC/D, BMP, and vancomycin trough. Labs - Thursday:  BMP and vancomycin trough Labs - Every other week:  ESR and CRP  Modified Medications   No medications on file  Discontinued Medications   CEFTRIAXONE (ROCEPHIN) IVPB    Inject 2 g into the vein every 12 (twelve) hours. Indication:  Lumbar epidural abscess Last Day of Therapy:  12/21/16 Labs - Once weekly:  CBC/D and BMP, Labs - Every other week:  ESR and CRP    Subjective: [name] is in for his hospital follow-up visit. He was hospitalized in late December with a three-week history of severe and worsening low back pain. He had undergone an epidural injection after the back pain began. He was found to have multiple paraspinal abscesses from L2-S1. Admission blood cultures were negative. A lumbar aspirate showed no organisms on stain and the culture remain negative. That specimen  was obtained after he started on empiric antibiotic therapy. He was discharged on IV vancomycin and ceftriaxone and has now completed 6 weeks of therapy. He is feeling better but still having low back pain. On admission to the hospital he rated his pain as 10 out of 10. His pain currently ranges from 6-8 out of 10. He is now only requiring 1 hydrocodone tablet at bedtime. He has not had any problems tolerating his PICC or antibiotics. He is scheduled for a follow-up MRI scan in early March.  Review of Systems: Review of Systems  Constitutional: Positive for malaise/fatigue. Negative for chills, diaphoresis, fever and weight loss.  Respiratory: Negative for cough and shortness of breath.   Cardiovascular: Negative for chest pain.  Gastrointestinal: Negative for abdominal pain, diarrhea, nausea and  vomiting.  Musculoskeletal: Positive for back pain.  Neurological: Positive for weakness. Negative for focal weakness.    Past Medical History:  Diagnosis Date  . Abscess in epidural space of lumbar spine 10/2016  . Arthritis    R knee  . Hyperlipidemia   . Hypertension    Dr Bettina Gavia in [place] is pt's cardiologist.  . Pneumonia   . Ulcer ([place])    stomach 60 years ago- no current problem    Social History  Substance Use Topics  . Smoking status: Former Smoker    Years: 6.00  . Smokeless tobacco: Former Systems developer    Quit date: 11/16/1950  . Alcohol use 3.6 oz/week    6 Glasses of wine per week    Family History  Problem Relation Age of Onset  . Anesthesia problems Neg Hx     No Known Allergies  Objective: Vitals:   12/22/16 0945  BP: 128/86  Pulse: (!) 106  Temp: 97.5 F (36.4 C)  TempSrc: Oral  Weight: 194 lb (88 kg)  Height: '5\' 11"'  (1.803 m)   Body mass index is 27.06 kg/m.  Physical Exam  Constitutional: He is oriented to person, place, and time.  He is in good spirits.  Cardiovascular: Normal rate and regular rhythm.   No murmur heard. Pulmonary/Chest: Effort normal and breath sounds normal.  Abdominal: Soft. There is no tenderness.  Musculoskeletal:  He walks slowly with the aid of a walker.  Neurological: He is alert and oriented to person, place, and time.  Skin: No rash noted.  His right arm PICC site looks good.  Psychiatric: Mood and affect normal.    Lab Results Creatinine 12/14/2016: 1.89 Sedimentation rate 12/14/2016: 22 C-reactive protein 12/14/2016: 18.3 Vancomycin trough 12/14/2016: 8.9   Problem List Items Addressed This Visit      High   Abscess in epidural space of lumbar spine    He is improving slowly but I would like to continue antibiotic therapy with a regimen of oral doxycycline and cefuroxime given the extent of the abscesses around his lower spine. I will stop ceftriaxone and have his PICC removed. His vancomycin was stopped  last week after his creatinine started going up. He will have repeat blood work today. He will have a follow-up MRI in early March in follow-up here in 6-8 weeks.      Relevant Medications   doxycycline (VIBRA-TABS) 100 MG tablet   cefUROXime (CEFTIN) 250 MG tablet       Michel Bickers, MD [facility] for Infectious [facility] [phone] pager   [phone] cell 12/22/2016, 10:25 AM

## 2016-12-23 DIAGNOSIS — G039 Meningitis, unspecified: Secondary | ICD-10-CM | POA: Diagnosis not present

## 2016-12-23 DIAGNOSIS — G061 Intraspinal abscess and granuloma: Secondary | ICD-10-CM | POA: Diagnosis not present

## 2016-12-24 DIAGNOSIS — N189 Chronic kidney disease, unspecified: Secondary | ICD-10-CM

## 2016-12-24 DIAGNOSIS — I5030 Unspecified diastolic (congestive) heart failure: Secondary | ICD-10-CM | POA: Insufficient documentation

## 2016-12-24 HISTORY — DX: Chronic kidney disease, unspecified: N18.9

## 2016-12-24 HISTORY — DX: Unspecified diastolic (congestive) heart failure: I50.30

## 2016-12-24 NOTE — Progress Notes (Signed)
Late entry for missed G-code. Based on review of the evaluation and goals by Samul Dadauth Stout, PT    12/01/16 1103  PT G-Codes **NOT FOR INPATIENT CLASS**  Functional Assessment Tool Used clinical judgement based on review of medical record  Functional Limitation Mobility: Walking and moving around  Mobility: Walking and Moving Around Current Status ([id number](G8978) CJ  Mobility: Walking and Moving Around Goal Status [phone](G8979) CI  Lavona MoundMark Safal Halderman, PT  [phone] 12/24/2016

## 2016-12-25 DIAGNOSIS — I481 Persistent atrial fibrillation: Secondary | ICD-10-CM | POA: Diagnosis not present

## 2016-12-25 DIAGNOSIS — I5032 Chronic diastolic (congestive) heart failure: Secondary | ICD-10-CM | POA: Diagnosis not present

## 2016-12-25 DIAGNOSIS — I119 Hypertensive heart disease without heart failure: Secondary | ICD-10-CM | POA: Diagnosis not present

## 2016-12-25 DIAGNOSIS — I482 Chronic atrial fibrillation: Secondary | ICD-10-CM | POA: Diagnosis not present

## 2017-01-01 DIAGNOSIS — H353134 Nonexudative age-related macular degeneration, bilateral, advanced atrophic with subfoveal involvement: Secondary | ICD-10-CM | POA: Diagnosis not present

## 2017-01-22 DIAGNOSIS — I5032 Chronic diastolic (congestive) heart failure: Secondary | ICD-10-CM | POA: Diagnosis not present

## 2017-01-22 DIAGNOSIS — D649 Anemia, unspecified: Secondary | ICD-10-CM | POA: Diagnosis not present

## 2017-01-22 DIAGNOSIS — D62 Acute posthemorrhagic anemia: Secondary | ICD-10-CM | POA: Diagnosis not present

## 2017-01-22 DIAGNOSIS — I110 Hypertensive heart disease with heart failure: Secondary | ICD-10-CM | POA: Diagnosis not present

## 2017-01-22 DIAGNOSIS — Z9181 History of falling: Secondary | ICD-10-CM | POA: Diagnosis not present

## 2017-01-22 DIAGNOSIS — I481 Persistent atrial fibrillation: Secondary | ICD-10-CM | POA: Diagnosis not present

## 2017-01-22 DIAGNOSIS — N189 Chronic kidney disease, unspecified: Secondary | ICD-10-CM | POA: Diagnosis not present

## 2017-01-22 DIAGNOSIS — J309 Allergic rhinitis, unspecified: Secondary | ICD-10-CM | POA: Diagnosis not present

## 2017-01-22 DIAGNOSIS — G061 Intraspinal abscess and granuloma: Secondary | ICD-10-CM | POA: Diagnosis not present

## 2017-01-22 DIAGNOSIS — I4891 Unspecified atrial fibrillation: Secondary | ICD-10-CM | POA: Diagnosis not present

## 2017-01-29 DIAGNOSIS — G061 Intraspinal abscess and granuloma: Secondary | ICD-10-CM | POA: Diagnosis not present

## 2017-01-29 DIAGNOSIS — M5126 Other intervertebral disc displacement, lumbar region: Secondary | ICD-10-CM | POA: Diagnosis not present

## 2017-02-03 DIAGNOSIS — M4316 Spondylolisthesis, lumbar region: Secondary | ICD-10-CM | POA: Diagnosis not present

## 2017-02-16 ENCOUNTER — Telehealth (INDEPENDENT_AMBULATORY_CARE_PROVIDER_SITE_OTHER): Payer: Self-pay | Admitting: Orthopaedic Surgery

## 2017-02-16 NOTE — Telephone Encounter (Signed)
[facility] called, received records but not the bills. I faxed the billing 10/16/2016- present [phone]

## 2017-02-16 NOTE — Telephone Encounter (Signed)
I RECEIVED VM FROM PTS DAUGHTER KATHY STEVENS,STATING NEEDING RECORDS FROM 1990'S. I CALLED HER BACK AT [phone] AND TOLD HER I NEEDED TO HAVE PERMISSION FROM THE PATIENT TO DISCUSS HIS RECORDS. I CALLED HIM AT [phone] AND LEFT A MESSAGE FOR HIM TO CALL ME BACK

## 2017-02-19 ENCOUNTER — Other Ambulatory Visit: Payer: Self-pay | Admitting: Internal Medicine

## 2017-02-19 DIAGNOSIS — G061 Intraspinal abscess and granuloma: Secondary | ICD-10-CM

## 2017-02-22 NOTE — Telephone Encounter (Signed)
RN spoke with Dr. Orvan Falconer.  Do not refill medications at this time.  Patient has an appointment 02/23/17 to see Dr. Orvan Falconer.

## 2017-02-23 ENCOUNTER — Ambulatory Visit (INDEPENDENT_AMBULATORY_CARE_PROVIDER_SITE_OTHER): Payer: Medicare Other | Admitting: Internal Medicine

## 2017-02-23 ENCOUNTER — Encounter: Payer: Self-pay | Admitting: Internal Medicine

## 2017-02-23 DIAGNOSIS — G061 Intraspinal abscess and granuloma: Secondary | ICD-10-CM

## 2017-02-23 NOTE — Progress Notes (Signed)
[facility]  Patient Active Problem List   Diagnosis Date Noted  . Abscess in epidural space of lumbar spine 11/17/2016    Priority: High  . Meningitis 11/11/2016    Priority: High  . Near syncope 11/28/2016  . Hematoma of right flank 11/28/2016  . Hyperlipidemia 11/28/2016  . Abnormal CXR   . Discitis   . PNA (pneumonia)   . A-fib [facility]), new onset [facility] hospital 12/25 11/11/2016  . Obesity hypoventilation syndrome ([facility]), presumed 11/11/2016  . UTI (urinary tract infection) 11/11/2016  . Sepsis ([facility]) 11/11/2016  . Osteoarthritis of right knee 02/05/2012    Class: End Stage    Patient's Medications  New Prescriptions   No medications on file  Previous Medications   ACETAMINOPHEN (TYLENOL PO)    Take by mouth.   ASPIRIN EC 81 MG TABLET    Take 81 mg by mouth.   AZELASTINE (ASTELIN) 0.1 % NASAL SPRAY    1 spray by Each Nare route Two (2) times a day. Use in each nostril as directed   AZELASTINE (OPTIVAR) 0.05 % OPHTHALMIC SOLUTION    1 drop Two (2) times a day.   BENAZEPRIL (LOTENSIN) 40 MG TABLET    Take 40 mg by mouth daily.   CARBOXYMETHYLCELLULOSE (REFRESH PLUS) 0.5 % SOLN    Place 1 drop into both eyes 3 (three) times daily as needed (for dryness).    CYCLOSPORINE (RESTASIS) 0.05 % OPHTHALMIC EMULSION    Place 1 drop into both eyes 2 (two) times daily.   DICLOFENAC SODIUM (VOLTAREN) 1 % GEL    Apply 2 g topically 4 (four) times daily.   DILTIAZEM (CARDIZEM CD) 240 MG 24 HR CAPSULE    Take 1 capsule (240 mg total) by mouth daily.   DIPHENHYDRAMINE HCL (BENADRYL PO)    Take by mouth.   FERROUS SULFATE (FERROUSUL) 325 (65 FE) MG TABLET    Take 1 tablet (325 mg total) by mouth 3 (three) times daily with meals.   FUROSEMIDE (LASIX) 40 MG TABLET    Take 1 tablet (40 mg total) by mouth 2 (two) times daily.   HYDROCODONE-ACETAMINOPHEN (NORCO/VICODIN) 5-325 MG TABLET       LATANOPROST (XALATAN) 0.005 % OPHTHALMIC SOLUTION    Place 1 drop  into both eyes at bedtime.   MULTIPLE VITAMINS-MINERALS (PRESERVISION AREDS 2 PO)    Take 1 tablet by mouth 2 (two) times daily.   PRAVASTATIN (PRAVACHOL) 40 MG TABLET    Take 40 mg by mouth daily.   TRAMADOL (ULTRAM) 50 MG TABLET    Take 50 mg by mouth 3 (three) times daily as needed for pain.  Modified Medications   No medications on file  Discontinued Medications   CEFUROXIME (CEFTIN) 250 MG TABLET    Take 1 tablet (250 mg total) by mouth 2 (two) times daily with a meal.   DOXYCYCLINE (VIBRA-TABS) 100 MG TABLET    Take 1 tablet (100 mg total) by mouth 2 (two) times daily.   PROBIOTIC PRODUCT (PROBIOTIC-10 PO)    Take by mouth.    Subjective: Andrew Salas is in for his routine follow-up visit. He completed 4 months of empiric antibiotic therapy for culture-negative lumbar epidural abscess 2 days ago. He was taking cefuroxime and doxycycline. He had no problems tolerating his antibiotics. He is feeling better. He is still having low back pain but much less and he was previously. He is only requiring occasional extra  strength Tylenol. He did undergo a lumbar MRI recently and Dr. Dutch Quint, his neurosurgeon, told him that the infection was gone.  Review of Systems: Review of Systems  Constitutional: Negative for chills, diaphoresis and fever.  Musculoskeletal: Positive for back pain.    Past Medical History:  Diagnosis Date  . Abscess in epidural space of lumbar spine 10/2016  . Arthritis    R knee  . Hyperlipidemia   . Hypertension    Dr Dulce Sellar in [place] is pt's cardiologist.  . Pneumonia   . Ulcer ([facility])    stomach 60 years ago- no current problem    Social History  Substance Use Topics  . Smoking status: Former Smoker    Years: 6.00  . Smokeless tobacco: Former Neurosurgeon    Quit date: 11/16/1950  . Alcohol use 3.6 oz/week    6 Glasses of wine per week    Family History  Problem Relation Age of Onset  . Anesthesia problems Neg Hx     No Known Allergies  Objective: Vitals:    02/23/17 1009  BP: 108/70  Pulse: 78  Temp: 97.6 F (36.4 C)  TempSrc: Oral  Weight: 200 lb (90.7 kg)  Height:  (1.803 m)   Body mass index is 27.89 kg/m.  Physical Exam  Constitutional: He is oriented to person, place, and time. No distress.  Neurological: He is alert and oriented to person, place, and time.  He is able to stand without much difficulty. He walks slowly with the aid of his walker which he was using before he developed his infection.  Skin: No rash noted.  Psychiatric: Mood and affect normal.    Lab Results    Problem List Items Addressed This Visit      High   Abscess in epidural space of lumbar spine    I'm hopeful that his infection has been cured. I will recheck his inflammatory markers, observe off of antibiotics and see him back in 6 weeks.      Relevant Orders   C-reactive protein   Sedimentation rate       Cliffton Asters, MD [facility] for Infectious Disease [facility] [phone] pager   [phone] cell 02/23/2017, 10:33 AM

## 2017-02-23 NOTE — Assessment & Plan Note (Signed)
I'm hopeful that his infection has been cured. I will recheck his inflammatory markers, observe off of antibiotics and see him back in 6 weeks.

## 2017-02-24 LAB — C-REACTIVE PROTEIN: CRP: 11.5 mg/L — AB (ref <8.0)

## 2017-02-24 LAB — SEDIMENTATION RATE: SED RATE: 15 mm/h (ref 0–20)

## 2017-03-22 DIAGNOSIS — I5032 Chronic diastolic (congestive) heart failure: Secondary | ICD-10-CM | POA: Diagnosis not present

## 2017-03-22 DIAGNOSIS — I482 Chronic atrial fibrillation: Secondary | ICD-10-CM | POA: Diagnosis not present

## 2017-03-22 DIAGNOSIS — I110 Hypertensive heart disease with heart failure: Secondary | ICD-10-CM | POA: Diagnosis not present

## 2017-04-06 ENCOUNTER — Ambulatory Visit: Payer: Self-pay | Admitting: Internal Medicine

## 2017-04-15 DIAGNOSIS — M4316 Spondylolisthesis, lumbar region: Secondary | ICD-10-CM | POA: Diagnosis not present

## 2017-04-23 ENCOUNTER — Other Ambulatory Visit: Payer: Self-pay

## 2017-04-26 ENCOUNTER — Ambulatory Visit (INDEPENDENT_AMBULATORY_CARE_PROVIDER_SITE_OTHER): Payer: Medicare Other | Admitting: Internal Medicine

## 2017-04-26 ENCOUNTER — Encounter: Payer: Self-pay | Admitting: Internal Medicine

## 2017-04-26 VITALS — BP 120/68 | HR 85 | Ht 71.0 in | Wt 201.6 lb

## 2017-04-26 DIAGNOSIS — I481 Persistent atrial fibrillation: Secondary | ICD-10-CM

## 2017-04-26 DIAGNOSIS — I5032 Chronic diastolic (congestive) heart failure: Secondary | ICD-10-CM | POA: Diagnosis not present

## 2017-04-26 DIAGNOSIS — I4819 Other persistent atrial fibrillation: Secondary | ICD-10-CM

## 2017-04-26 DIAGNOSIS — E785 Hyperlipidemia, unspecified: Secondary | ICD-10-CM | POA: Diagnosis not present

## 2017-04-26 DIAGNOSIS — I10 Essential (primary) hypertension: Secondary | ICD-10-CM | POA: Diagnosis not present

## 2017-04-26 DIAGNOSIS — D509 Iron deficiency anemia, unspecified: Secondary | ICD-10-CM | POA: Diagnosis not present

## 2017-04-26 NOTE — Patient Instructions (Signed)
Medication Instructions:  Your physician recommends that you continue on your current medications as directed. Please refer to the Current Medication list given to you today.   Labwork: None ordered   Testing/Procedures: None ordered   Follow-Up: Your physician recommends that you schedule a follow-up appointment in: 3 months with Dr [place] and as needed with Dr Johney FrameAllred   Any Other Special Instructions Will Be Listed Below (If Applicable).     If you need a refill on your cardiac medications before your next appointment, please call your pharmacy.

## 2017-04-26 NOTE — Progress Notes (Signed)
Watchman Consult Note   Date:  04/26/2017   ID:  Andrew Salas, DOB September 14, 1926, MRN [id number]  PCP:  Paulina FusiSchultz, Douglas E, MD  Cardiologist:  Dulce SellarMunley Referring Physician: Dulce SellarMunley   CC: to discuss Watchman implant    History of Present Illness: Andrew Salas is a 83 y.o. male referred by Dr Dulce SellarMunley for evaluation of atrial fibrillation and stroke prevention. He has permanent atrial fibrillation as well as hypertension, recent epidural abscess requiring IV antibiotics, and chronic kidney disease. He has also had several falls with hematoma requiring transfusion.  The patient has been evaluated by their referring physician and is felt to be a poor candidate for long term OAC due to falls, anemia requiring transfusion after significant hematoma.  He therefore presents today for Watchman evaluation.   Today, he denies symptoms of palpitations, chest pain, shortness of breath, orthopnea, PND, lower extremity edema, claudication, dizziness, presyncope, syncope, bleeding, or neurologic sequela. The patient is tolerating medications without difficulties and is otherwise without complaint today.   Echo 11/2016 demonstrated EF 50-55%, no RWMA, LA 41.    Past Medical History:  Diagnosis Date  . Abscess in epidural space of lumbar spine 10/2016  . Arthritis    R knee  . Chronic kidney disease (CKD), stage III (moderate)   . Hyperlipidemia   . Hypertension    Dr Dul[place]unley in [place] is pt's cardiologist.  . Permanent atrial fibrillation (HCC)   . Pneumonia   . Ulcer    stomach 60 years ago- no current problem   Past Surgical History:  Procedure Laterality Date  . CARDIAC CATHETERIZATION  6 yrs ago   [facility]  . EYE SURGERY  12/2010   Cataract bil with lens implant, please be careful if washing  eyes  . HARDWARE REMOVAL  12/25/2011   Procedure: HARDWARE REMOVAL;  Surgeon: Eldred MangesMark C Yates, MD;  [facility];  Service: Orthopedics;  Laterality: Right;   Hardware Removal Right  Femur  . HERNIA REPAIR     per chest X- Ray  . KNEE ARTHROPLASTY  02/03/2012   Procedure: COMPUTER ASSISTED TOTAL KNEE ARTHROPLASTY;  Surgeon: Eldred MangesMark C Yates, MD;  Location: [facility] OR;  Service: Orthopedics;  Laterality: Right;  Right Total Knee Arthroplasty, Cemented  . KNEE ARTHROSCOPY     bilateral  . KNEE ARTHROSCOPY  1989   Right  . KNEE RECONSTRUCTION, MEDIAL PATELLAR FEMORAL LIGAMENT     "screws and pins inserted" from a fracture  . PROSTATE SURGERY     years ago "the Dr microwaved it"  . REPLACEMENT TOTAL KNEE       Current Outpatient Prescriptions  Medication Sig Dispense Refill  . Acetaminophen (TYLENOL PO) Take by mouth as directed.     Marland Kitchen. aspirin EC 81 MG tablet Take 81 mg by mouth as directed.     Marland Kitchen. azelastine (OPTIVAR) 0.05 % ophthalmic solution 1 drop Two (2) times a day as directed    . benazepril (LOTENSIN) 40 MG tablet Take 40 mg by mouth daily.    . carboxymethylcellulose (REFRESH PLUS) 0.5 % SOLN Place 1 drop into both eyes 3 (three) times daily as needed (for dryness).     . cycloSPORINE (RESTASIS) 0.05 % ophthalmic emulsion Place 1 drop into both eyes 2 (two) times daily.    . diclofenac sodium (VOLTAREN) 1 % GEL Apply 2 g topically 4 (four) times daily.    Marland Kitchen. diltiazem (CARDIZEM CD) 240 MG 24 hr capsule Take 1 capsule (  240 mg total) by mouth daily. 30 capsule 0  . DiphenhydrAMINE HCl (BENADRYL PO) Take by mouth as directed.     . ferrous sulfate (FERROUSUL) 325 (65 FE) MG tablet Take 1 tablet (325 mg total) by mouth 3 (three) times daily with meals. 90 tablet 0  . furosemide (LASIX) 40 MG tablet Take 1 tablet (40 mg total) by mouth 2 (two) times daily. (Patient taking differently: Take 40 mg by mouth daily. ) 60 tablet 0  . latanoprost (XALATAN) 0.005 % ophthalmic solution Place 1 drop into both eyes at bedtime.    . Multiple Vitamins-Minerals (PRESERVISION AREDS 2 PO) Take 1 tablet by mouth 2 (two) times daily.    . pravastatin (PRAVACHOL) 40 MG tablet Take 40 mg by  mouth daily.     No current facility-administered medications for this visit.     Allergies:   Patient has no known allergies.   Social History:  The patient  reports that he has quit smoking. He quit after 6.00 years of use. He quit smokeless tobacco use about 66 years ago. He reports that he drinks about 3.6 oz of alcohol per week . He reports that he does not use drugs.   Family History:  No premature CAD or stroke   ROS:  Please see the history of present illness.   All other systems are reviewed and negative.    PHYSICAL EXAM: VS:  BP 120/68   Pulse 85   Ht 5\' 11"  (1.803 m)   Wt 201 lb 9.6 oz (91.4 kg)   SpO2 95%   BMI 28.12 kg/m  , BMI Body mass index is 28.12 kg/m. GEN: elderly, well nourished, well developed, elderly,  in no acute distress  HEENT: normal  Neck: no JVD, carotid bruits, or masses Cardiac: iRRR; no murmurs, rubs, or gallops,no edema  Respiratory:  clear to auscultation bilaterally, normal work of breathing GI: soft, nontender, nondistended, + BS MS: no deformity or atrophy  Skin: warm and dry  Neuro:  Strength and sensation are intact Psych: euthymic mood, full affect  EKG:  EKG is ordered today. EKG today demonstrates typical atrial flutter, v rate 85, RBBB  Recent Labs: 11/15/2016: ALT 34; Magnesium 2.0 12/01/2016: BUN 19; Creatinine, Ser 1.26; Hemoglobin 8.6; Platelets 152; Potassium 3.6; Sodium 135    Lipid Panel  No results found for: CHOL, TRIG, HDL, CHOLHDL, VLDL, LDLCALC, LDLDIRECT   Wt Readings from Last 3 Encounters:  04/26/17 201 lb 9.6 oz (91.4 kg)  02/23/17 200 lb (90.7 kg)  12/22/16 194 lb (88 kg)      Other studies Reviewed: Additional studies/ records that were reviewed today include: Dr Dulce Sellar and Dr Blair Dolphin office notes   ASSESSMENT AND PLAN:  1.  Permanet atrial fibrillation I have seen Andrew Salas is a 83 y.o. male in the office today who has been referred by Dr Mathis Bud for a Watchman left atrial appendage  closure device.  He has a history of permanent atrial fibrillation.  This patients CHA2DS2-VASc Score and unadjusted Ischemic Stroke Rate (% per year) is equal to 3.2 % stroke rate/year from a score of 3 which necessitates long term oral anticoagulation to prevent stroke.  Unfortunately, He is not felt to be a long term Warfarin candidate secondary to frequent falls and significant hematoma off OAC requiring transfusion.  With advanced age, and bleeding risks off of OAC, I am concerned about procedural risks as well as risks for [facility] peri-procedurally. I have discussed with the  patient and his friend today.  After a long discussion, they would like to continue current plan and not pursue Watchman implant which I think is reasonable.      Follow-up:  With Watchman team prn   Current medicines are reviewed at length with the patient today.   The patient does not have concerns regarding his medicines.  The following changes were made today:  none   Signed, Hillis Range, MD  04/26/2017 11:24 AM     [facility] [address] [place] [address] [phone] (office) [phone] (fax)

## 2017-04-28 DIAGNOSIS — E875 Hyperkalemia: Secondary | ICD-10-CM | POA: Diagnosis not present

## 2017-06-28 ENCOUNTER — Ambulatory Visit (INDEPENDENT_AMBULATORY_CARE_PROVIDER_SITE_OTHER): Payer: Medicare Other | Admitting: Cardiology

## 2017-06-28 VITALS — BP 100/60 | HR 68 | Resp 10 | Ht 71.0 in | Wt 197.1 lb

## 2017-06-28 DIAGNOSIS — I110 Hypertensive heart disease with heart failure: Secondary | ICD-10-CM | POA: Diagnosis not present

## 2017-06-28 DIAGNOSIS — I4821 Permanent atrial fibrillation: Secondary | ICD-10-CM

## 2017-06-28 DIAGNOSIS — I5032 Chronic diastolic (congestive) heart failure: Secondary | ICD-10-CM | POA: Diagnosis not present

## 2017-06-28 DIAGNOSIS — I482 Chronic atrial fibrillation: Secondary | ICD-10-CM

## 2017-06-28 MED ORDER — APIXABAN 2.5 MG PO TABS: 2.5000 mg | ORAL_TABLET | Freq: Two times a day (BID) | ORAL | 11 refills | Status: DC

## 2017-06-28 MED ORDER — APIXABAN 2.5 MG PO TABS: 2.5000 mg | ORAL_TABLET | Freq: Two times a day (BID) | ORAL | Status: DC

## 2017-06-28 NOTE — Progress Notes (Signed)
Cardiology Office Note:    Date:  06/28/2017   ID:  Andrew Salas, DOB 08/30/1926, MRN [id number]  PCP:  Paulina Fusi, MD  Cardiologist:  Norman Herrlich, MD    Referring MD: Paulina Fusi, MD    ASSESSMENT:    1. Chronic diastolic congestive heart failure (HCC)   2. Permanent atrial fibrillation (HCC)   3. Hypertensive heart disease with heart failure (HCC)    PLAN:    In order of problems listed above:  1. Stable compensated continue current loop diuretic weight labs from his PCP regarding renal function 2. Stable rate controlled continue calcium channel blocker stop aspirin and start renal dose reduced anticoagulant 3. Stable blood pressure at target continue current diuretic. 4. Hyperlipidemia stable continue his statin   Next appointment: 6 months   Medication Adjustments/Labs and Tests Ordered: Current medicines are reviewed at length with the patient today.  Concerns regarding medicines are outlined above.  No orders of the defined types were placed in this encounter.  Meds ordered this encounter  Medications  . apixaban (ELIQUIS) tablet 2.5 mg    Chief Complaint  Patient presents with  . Follow-up    History of Present Illness:    Andrew Salas is a 83 y.o. male with a hx of CHF, permanent Atrial Fibrillation with CHADS2 vasc of 3, Dyslipidemia, HTN, stage 3 CKD, epidural abcess and a traumatic right gluteus maximus hematoma with anemia requiring transfusion that occurred while anticoagulated  last seen 3 months ago.Marland Kitchen He continues to improve feels he is back to his baseline pleased with the quality of his life and has had no angina dyspnea palpitation syncope falls or trauma. He is felt to be an excessive risk for the watchman device and agrees to restart anticoagulant air be using a direct age and with her renal dose reduction. Compliance with diet, lifestyle and medications: Yes Past Medical History:  Diagnosis Date  . Abnormal CXR     . Abscess in epidural space of lumbar spine 10/2016  . Arthritis    R knee  . Cardiomyopathy (HCC) 12/16/2016   Overview:  EF 45-50%  . Chronic kidney disease (CKD), stage III (moderate)   . CKD (chronic kidney disease) 12/24/2016  . Diastolic congestive heart failure (HCC) 12/24/2016  . Discitis   . Hematoma of right flank 11/28/2016  . Hyperlipidemia   . Hypertension    Dr Dulce Sellar in [place] is pt's cardiologist.  . Hypertensive heart disease with heart failure (HCC) 12/16/2016  . Meningitis 11/11/2016  . Near syncope 11/28/2016  . Obesity hypoventilation syndrome (HCC), presumed 11/11/2016  . Osteoarthritis of right knee 02/05/2012   Admitting diagnosis   . Permanent atrial fibrillation (HCC)   . Persistent atrial fibrillation (HCC) 11/11/2016  . PNA (pneumonia)   . Pneumonia   . 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 Sepsis (HCC) 11/11/2016  . Ulcer    stomach 60 years ago- no current problem  . UTI (urinary tract infection) 11/11/2016    Past Surgical History:  Procedure Laterality Date  . CARDIAC CATHETERIZATION  6 yrs ago   [facility]  . EYE SURGERY  12/2010   Cataract bil with lens implant, please be careful if washing  eyes  . HARDWARE REMOVAL  12/25/2011   Procedure: HARDWARE REMOVAL;  Surgeon: Eldred Manges, MD;  Location: [facility] OR;  Service: Orthopedics;  Laterality: Right;   Hardware Removal Right Femur  .  HERNIA REPAIR     per chest X- Ray  . KNEE ARTHROPLASTY  02/03/2012   Procedure: COMPUTER ASSISTED TOTAL KNEE ARTHROPLASTY;  Surgeon: Eldred MangesMark C Yates, MD;  Location: [facility] OR;  Service: Orthopedics;  Laterality: Right;  Right Total Knee Arthroplasty, Cemented  . KNEE ARTHROSCOPY     bilateral  . KNEE ARTHROSCOPY  1989   Right  . KNEE RECONSTRUCTION, MEDIAL PATELLAR FEMORAL LIGAMENT     "screws and pins inserted" from a fracture  . PROSTATE SURGERY     years ago "the Dr microwaved it"  . REPLACEMENT TOTAL KNEE       Current Medications: Current Meds  Medication Sig  . Acetaminophen (TYLENOL PO) Take by mouth as directed.   Marland Kitchen. azelastine (OPTIVAR) 0.05 % ophthalmic solution 1 drop Two (2) times a day as directed  . benazepril (LOTENSIN) 40 MG tablet Take 40 mg by mouth daily.  . carboxymethylcellulose (REFRESH PLUS) 0.5 % SOLN Place 1 drop into both eyes 3 (three) times daily as needed (for dryness).   . cycloSPORINE (RESTASIS) 0.05 % ophthalmic emulsion Place 1 drop into both eyes 2 (two) times daily.  . diclofenac sodium (VOLTAREN) 1 % GEL Apply 2 g topically 4 (four) times daily.  Marland Kitchen. diltiazem (CARDIZEM CD) 240 MG 24 hr capsule Take 1 capsule (240 mg total) by mouth daily.  . DiphenhydrAMINE HCl (BENADRYL PO) Take by mouth as directed.   . ferrous sulfate (FERROUSUL) 325 (65 FE) MG tablet Take 1 tablet (325 mg total) by mouth 3 (three) times daily with meals.  . furosemide (LASIX) 40 MG tablet Take 1 tablet (40 mg total) by mouth 2 (two) times daily. (Patient taking differently: Take 40 mg by mouth daily. )  . latanoprost (XALATAN) 0.005 % ophthalmic solution Place 1 drop into both eyes at bedtime.  . Multiple Vitamins-Minerals (PRESERVISION AREDS 2 PO) Take 1 tablet by mouth 2 (two) times daily.  . pravastatin (PRAVACHOL) 40 MG tablet Take 40 mg by mouth daily.  . [DISCONTINUED] aspirin EC 81 MG tablet Take 81 mg by mouth as directed.    Current Facility-Administered Medications for the 06/28/17 encounter (Office Visit) with Baldo DaubMunley, Brian J, MD  Medication  . apixaban (ELIQUIS) tablet 2.5 mg     Allergies:   Patient has no known allergies.   Social History   Social History  . Marital status: Widowed    Spouse name: N/A  . Number of children: N/A  . Years of education: N/A   Social History Main Topics  . Smoking status: Former Smoker    Years: 6.00  . Smokeless tobacco: Former NeurosurgeonUser    Quit date: 11/16/1950  . Alcohol use 3.6 oz/week    6 Glasses of wine per week  . Drug use: No  .  Sexual activity: Not Asked   Other Topics Concern  . None   Social History Narrative  . None     Family History: The patient's family history includes Cancer in his father; Heart failure in his father; Macular degeneration in his mother. There is no history of Anesthesia problems. ROS:   Please see the history of present illness.    All other systems reviewed and are negative.  EKGs/Labs/Other Studies Reviewed:    The following studies were reviewed today:  EKG:  EKG ordered today.  The ekg ordered today demonstrates SRTH normal  Recent Labs: 11/15/2016: ALT 34; Magnesium 2.0 12/01/2016: BUN 19; Creatinine, Ser 1.26; Hemoglobin 8.6; Platelets 152; Potassium 3.6; Sodium 135  Recent Lipid Panel No results found for: CHOL, TRIG, HDL, CHOLHDL, VLDL, LDLCALC, LDLDIRECT  Physical Exam:    VS:  BP 100/60   Pulse 68   Resp 10   Ht 5\' 11"  (1.803 m)   Wt 197 lb 1.9 oz (89.4 kg)   BMI 27.49 kg/m     Wt Readings from Last 3 Encounters:  06/28/17 197 lb 1.9 oz (89.4 kg)  04/26/17 201 lb 9.6 oz (91.4 kg)  02/23/17 200 lb (90.7 kg)     GEN:  Well nourished, well developed in no acute distress HEENT: Normal NECK: No JVD; No carotid bruits LYMPHATICS: No lymphadenopathy CARDIAC: RRR, no murmurs, rubs, gallops RESPIRATORY:  Clear to auscultation without rales, wheezing or rhonchi  ABDOMEN: Soft, non-tender, non-distended MUSCULOSKELETAL:  No edema; No deformity  SKIN: Warm and dry NEUROLOGIC:  Alert and oriented x 3 PSYCHIATRIC:  Normal affect    Signed, Norman Herrlich, MD  06/28/2017 3:51 PM    [facility]

## 2017-06-28 NOTE — Patient Instructions (Addendum)
Medication Instructions:  Your physician has recommended you make the following change in your medication:  STOP aspirin START apixaban (Eliquis) 2.5 mg daily   Labwork: NONE  Testing/Procedures: NONE  Follow-Up: Your physician wants you to follow-up in: 6 months.   You will receive a reminder letter in the mail two months in advance. If you don't receive a letter, please call our office to schedule the follow-up appointment.  Any Other Special Instructions Will Be Listed Below (If Applicable).     If you need a refill on your cardiac medications before your next appointment, please call your pharmacy.

## 2017-08-17 DIAGNOSIS — I10 Essential (primary) hypertension: Secondary | ICD-10-CM | POA: Diagnosis not present

## 2017-08-17 DIAGNOSIS — Z23 Encounter for immunization: Secondary | ICD-10-CM | POA: Diagnosis not present

## 2017-08-17 DIAGNOSIS — E785 Hyperlipidemia, unspecified: Secondary | ICD-10-CM | POA: Diagnosis not present

## 2017-08-17 DIAGNOSIS — D509 Iron deficiency anemia, unspecified: Secondary | ICD-10-CM | POA: Diagnosis not present

## 2017-08-17 DIAGNOSIS — L0211 Cutaneous abscess of neck: Secondary | ICD-10-CM | POA: Diagnosis not present

## 2017-08-17 DIAGNOSIS — I5032 Chronic diastolic (congestive) heart failure: Secondary | ICD-10-CM | POA: Diagnosis not present

## 2017-08-25 DIAGNOSIS — Z7689 Persons encountering health services in other specified circumstances: Secondary | ICD-10-CM | POA: Diagnosis not present

## 2017-12-03 DIAGNOSIS — D509 Iron deficiency anemia, unspecified: Secondary | ICD-10-CM | POA: Diagnosis not present

## 2017-12-03 DIAGNOSIS — E785 Hyperlipidemia, unspecified: Secondary | ICD-10-CM | POA: Diagnosis not present

## 2017-12-03 DIAGNOSIS — I4891 Unspecified atrial fibrillation: Secondary | ICD-10-CM | POA: Diagnosis not present

## 2017-12-03 DIAGNOSIS — I10 Essential (primary) hypertension: Secondary | ICD-10-CM | POA: Diagnosis not present

## 2017-12-03 DIAGNOSIS — I5032 Chronic diastolic (congestive) heart failure: Secondary | ICD-10-CM | POA: Diagnosis not present

## 2017-12-11 DIAGNOSIS — J069 Acute upper respiratory infection, unspecified: Secondary | ICD-10-CM | POA: Diagnosis not present

## 2018-01-27 DIAGNOSIS — R350 Frequency of micturition: Secondary | ICD-10-CM | POA: Diagnosis not present

## 2018-02-28 DIAGNOSIS — L03115 Cellulitis of right lower limb: Secondary | ICD-10-CM | POA: Diagnosis not present

## 2018-03-03 DIAGNOSIS — I5032 Chronic diastolic (congestive) heart failure: Secondary | ICD-10-CM | POA: Diagnosis not present

## 2018-03-03 DIAGNOSIS — D509 Iron deficiency anemia, unspecified: Secondary | ICD-10-CM | POA: Diagnosis not present

## 2018-03-03 DIAGNOSIS — Z9181 History of falling: Secondary | ICD-10-CM | POA: Diagnosis not present

## 2018-03-03 DIAGNOSIS — I10 Essential (primary) hypertension: Secondary | ICD-10-CM | POA: Diagnosis not present

## 2018-03-03 DIAGNOSIS — E785 Hyperlipidemia, unspecified: Secondary | ICD-10-CM | POA: Diagnosis not present

## 2018-03-03 DIAGNOSIS — Z0000 Encounter for general adult medical examination without abnormal findings: Secondary | ICD-10-CM | POA: Diagnosis not present

## 2018-03-03 DIAGNOSIS — Z1331 Encounter for screening for depression: Secondary | ICD-10-CM | POA: Diagnosis not present

## 2018-03-03 DIAGNOSIS — L03115 Cellulitis of right lower limb: Secondary | ICD-10-CM | POA: Diagnosis not present

## 2018-03-18 DIAGNOSIS — L853 Xerosis cutis: Secondary | ICD-10-CM | POA: Diagnosis not present

## 2018-03-18 DIAGNOSIS — L03115 Cellulitis of right lower limb: Secondary | ICD-10-CM | POA: Diagnosis not present

## 2018-04-19 DIAGNOSIS — S61309A Unspecified open wound of unspecified finger with damage to nail, initial encounter: Secondary | ICD-10-CM | POA: Diagnosis not present

## 2018-05-18 DIAGNOSIS — I4510 Unspecified right bundle-branch block: Secondary | ICD-10-CM | POA: Diagnosis not present

## 2018-05-18 DIAGNOSIS — Z7901 Long term (current) use of anticoagulants: Secondary | ICD-10-CM | POA: Diagnosis not present

## 2018-05-18 DIAGNOSIS — E785 Hyperlipidemia, unspecified: Secondary | ICD-10-CM | POA: Diagnosis not present

## 2018-05-18 DIAGNOSIS — I130 Hypertensive heart and chronic kidney disease with heart failure and stage 1 through stage 4 chronic kidney disease, or unspecified chronic kidney disease: Secondary | ICD-10-CM | POA: Diagnosis not present

## 2018-05-18 DIAGNOSIS — I4891 Unspecified atrial fibrillation: Secondary | ICD-10-CM | POA: Diagnosis not present

## 2018-05-18 DIAGNOSIS — H548 Legal blindness, as defined in USA: Secondary | ICD-10-CM | POA: Diagnosis not present

## 2018-05-18 DIAGNOSIS — H3530 Unspecified macular degeneration: Secondary | ICD-10-CM | POA: Diagnosis not present

## 2018-05-18 DIAGNOSIS — D509 Iron deficiency anemia, unspecified: Secondary | ICD-10-CM | POA: Diagnosis not present

## 2018-05-18 DIAGNOSIS — I5032 Chronic diastolic (congestive) heart failure: Secondary | ICD-10-CM | POA: Diagnosis not present

## 2018-05-18 DIAGNOSIS — N183 Chronic kidney disease, stage 3 (moderate): Secondary | ICD-10-CM | POA: Diagnosis not present

## 2018-05-18 DIAGNOSIS — J449 Chronic obstructive pulmonary disease, unspecified: Secondary | ICD-10-CM | POA: Diagnosis not present

## 2018-05-18 DIAGNOSIS — Z791 Long term (current) use of non-steroidal anti-inflammatories (NSAID): Secondary | ICD-10-CM | POA: Diagnosis not present

## 2018-05-18 DIAGNOSIS — I700 Atherosclerosis of aorta: Secondary | ICD-10-CM | POA: Diagnosis not present

## 2018-05-22 DIAGNOSIS — I4510 Unspecified right bundle-branch block: Secondary | ICD-10-CM | POA: Diagnosis not present

## 2018-05-22 DIAGNOSIS — J449 Chronic obstructive pulmonary disease, unspecified: Secondary | ICD-10-CM | POA: Diagnosis not present

## 2018-05-22 DIAGNOSIS — I700 Atherosclerosis of aorta: Secondary | ICD-10-CM | POA: Diagnosis not present

## 2018-05-22 DIAGNOSIS — N183 Chronic kidney disease, stage 3 (moderate): Secondary | ICD-10-CM | POA: Diagnosis not present

## 2018-05-22 DIAGNOSIS — Z7901 Long term (current) use of anticoagulants: Secondary | ICD-10-CM | POA: Diagnosis not present

## 2018-05-22 DIAGNOSIS — D509 Iron deficiency anemia, unspecified: Secondary | ICD-10-CM | POA: Diagnosis not present

## 2018-05-22 DIAGNOSIS — E785 Hyperlipidemia, unspecified: Secondary | ICD-10-CM | POA: Diagnosis not present

## 2018-05-22 DIAGNOSIS — H548 Legal blindness, as defined in USA: Secondary | ICD-10-CM | POA: Diagnosis not present

## 2018-05-22 DIAGNOSIS — I130 Hypertensive heart and chronic kidney disease with heart failure and stage 1 through stage 4 chronic kidney disease, or unspecified chronic kidney disease: Secondary | ICD-10-CM | POA: Diagnosis not present

## 2018-05-22 DIAGNOSIS — H3530 Unspecified macular degeneration: Secondary | ICD-10-CM | POA: Diagnosis not present

## 2018-05-22 DIAGNOSIS — Z791 Long term (current) use of non-steroidal anti-inflammatories (NSAID): Secondary | ICD-10-CM | POA: Diagnosis not present

## 2018-05-22 DIAGNOSIS — I4891 Unspecified atrial fibrillation: Secondary | ICD-10-CM | POA: Diagnosis not present

## 2018-05-22 DIAGNOSIS — I5032 Chronic diastolic (congestive) heart failure: Secondary | ICD-10-CM | POA: Diagnosis not present

## 2018-05-23 DIAGNOSIS — Z7901 Long term (current) use of anticoagulants: Secondary | ICD-10-CM | POA: Diagnosis not present

## 2018-05-23 DIAGNOSIS — Z791 Long term (current) use of non-steroidal anti-inflammatories (NSAID): Secondary | ICD-10-CM | POA: Diagnosis not present

## 2018-05-23 DIAGNOSIS — N183 Chronic kidney disease, stage 3 (moderate): Secondary | ICD-10-CM | POA: Diagnosis not present

## 2018-05-23 DIAGNOSIS — E785 Hyperlipidemia, unspecified: Secondary | ICD-10-CM | POA: Diagnosis not present

## 2018-05-23 DIAGNOSIS — I4891 Unspecified atrial fibrillation: Secondary | ICD-10-CM | POA: Diagnosis not present

## 2018-05-23 DIAGNOSIS — I5032 Chronic diastolic (congestive) heart failure: Secondary | ICD-10-CM | POA: Diagnosis not present

## 2018-05-23 DIAGNOSIS — I700 Atherosclerosis of aorta: Secondary | ICD-10-CM | POA: Diagnosis not present

## 2018-05-23 DIAGNOSIS — J449 Chronic obstructive pulmonary disease, unspecified: Secondary | ICD-10-CM | POA: Diagnosis not present

## 2018-05-23 DIAGNOSIS — H3530 Unspecified macular degeneration: Secondary | ICD-10-CM | POA: Diagnosis not present

## 2018-05-23 DIAGNOSIS — H548 Legal blindness, as defined in USA: Secondary | ICD-10-CM | POA: Diagnosis not present

## 2018-05-23 DIAGNOSIS — I130 Hypertensive heart and chronic kidney disease with heart failure and stage 1 through stage 4 chronic kidney disease, or unspecified chronic kidney disease: Secondary | ICD-10-CM | POA: Diagnosis not present

## 2018-05-23 DIAGNOSIS — D509 Iron deficiency anemia, unspecified: Secondary | ICD-10-CM | POA: Diagnosis not present

## 2018-05-23 DIAGNOSIS — I4510 Unspecified right bundle-branch block: Secondary | ICD-10-CM | POA: Diagnosis not present

## 2018-05-25 DIAGNOSIS — H548 Legal blindness, as defined in USA: Secondary | ICD-10-CM | POA: Diagnosis not present

## 2018-05-25 DIAGNOSIS — I130 Hypertensive heart and chronic kidney disease with heart failure and stage 1 through stage 4 chronic kidney disease, or unspecified chronic kidney disease: Secondary | ICD-10-CM | POA: Diagnosis not present

## 2018-05-25 DIAGNOSIS — D509 Iron deficiency anemia, unspecified: Secondary | ICD-10-CM | POA: Diagnosis not present

## 2018-05-25 DIAGNOSIS — Z791 Long term (current) use of non-steroidal anti-inflammatories (NSAID): Secondary | ICD-10-CM | POA: Diagnosis not present

## 2018-05-25 DIAGNOSIS — I700 Atherosclerosis of aorta: Secondary | ICD-10-CM | POA: Diagnosis not present

## 2018-05-25 DIAGNOSIS — E785 Hyperlipidemia, unspecified: Secondary | ICD-10-CM | POA: Diagnosis not present

## 2018-05-25 DIAGNOSIS — N183 Chronic kidney disease, stage 3 (moderate): Secondary | ICD-10-CM | POA: Diagnosis not present

## 2018-05-25 DIAGNOSIS — I4891 Unspecified atrial fibrillation: Secondary | ICD-10-CM | POA: Diagnosis not present

## 2018-05-25 DIAGNOSIS — J449 Chronic obstructive pulmonary disease, unspecified: Secondary | ICD-10-CM | POA: Diagnosis not present

## 2018-05-25 DIAGNOSIS — I4510 Unspecified right bundle-branch block: Secondary | ICD-10-CM | POA: Diagnosis not present

## 2018-05-25 DIAGNOSIS — Z7901 Long term (current) use of anticoagulants: Secondary | ICD-10-CM | POA: Diagnosis not present

## 2018-05-25 DIAGNOSIS — I5032 Chronic diastolic (congestive) heart failure: Secondary | ICD-10-CM | POA: Diagnosis not present

## 2018-05-25 DIAGNOSIS — H3530 Unspecified macular degeneration: Secondary | ICD-10-CM | POA: Diagnosis not present

## 2018-05-25 IMAGING — DX DG CHEST 2V
2 series · 2 of 2 positions shown · non-contrast
Comparison: 11/06/2016

CLINICAL DATA: Discitis.

EXAM:
CHEST  2 VIEW

[chest lat]
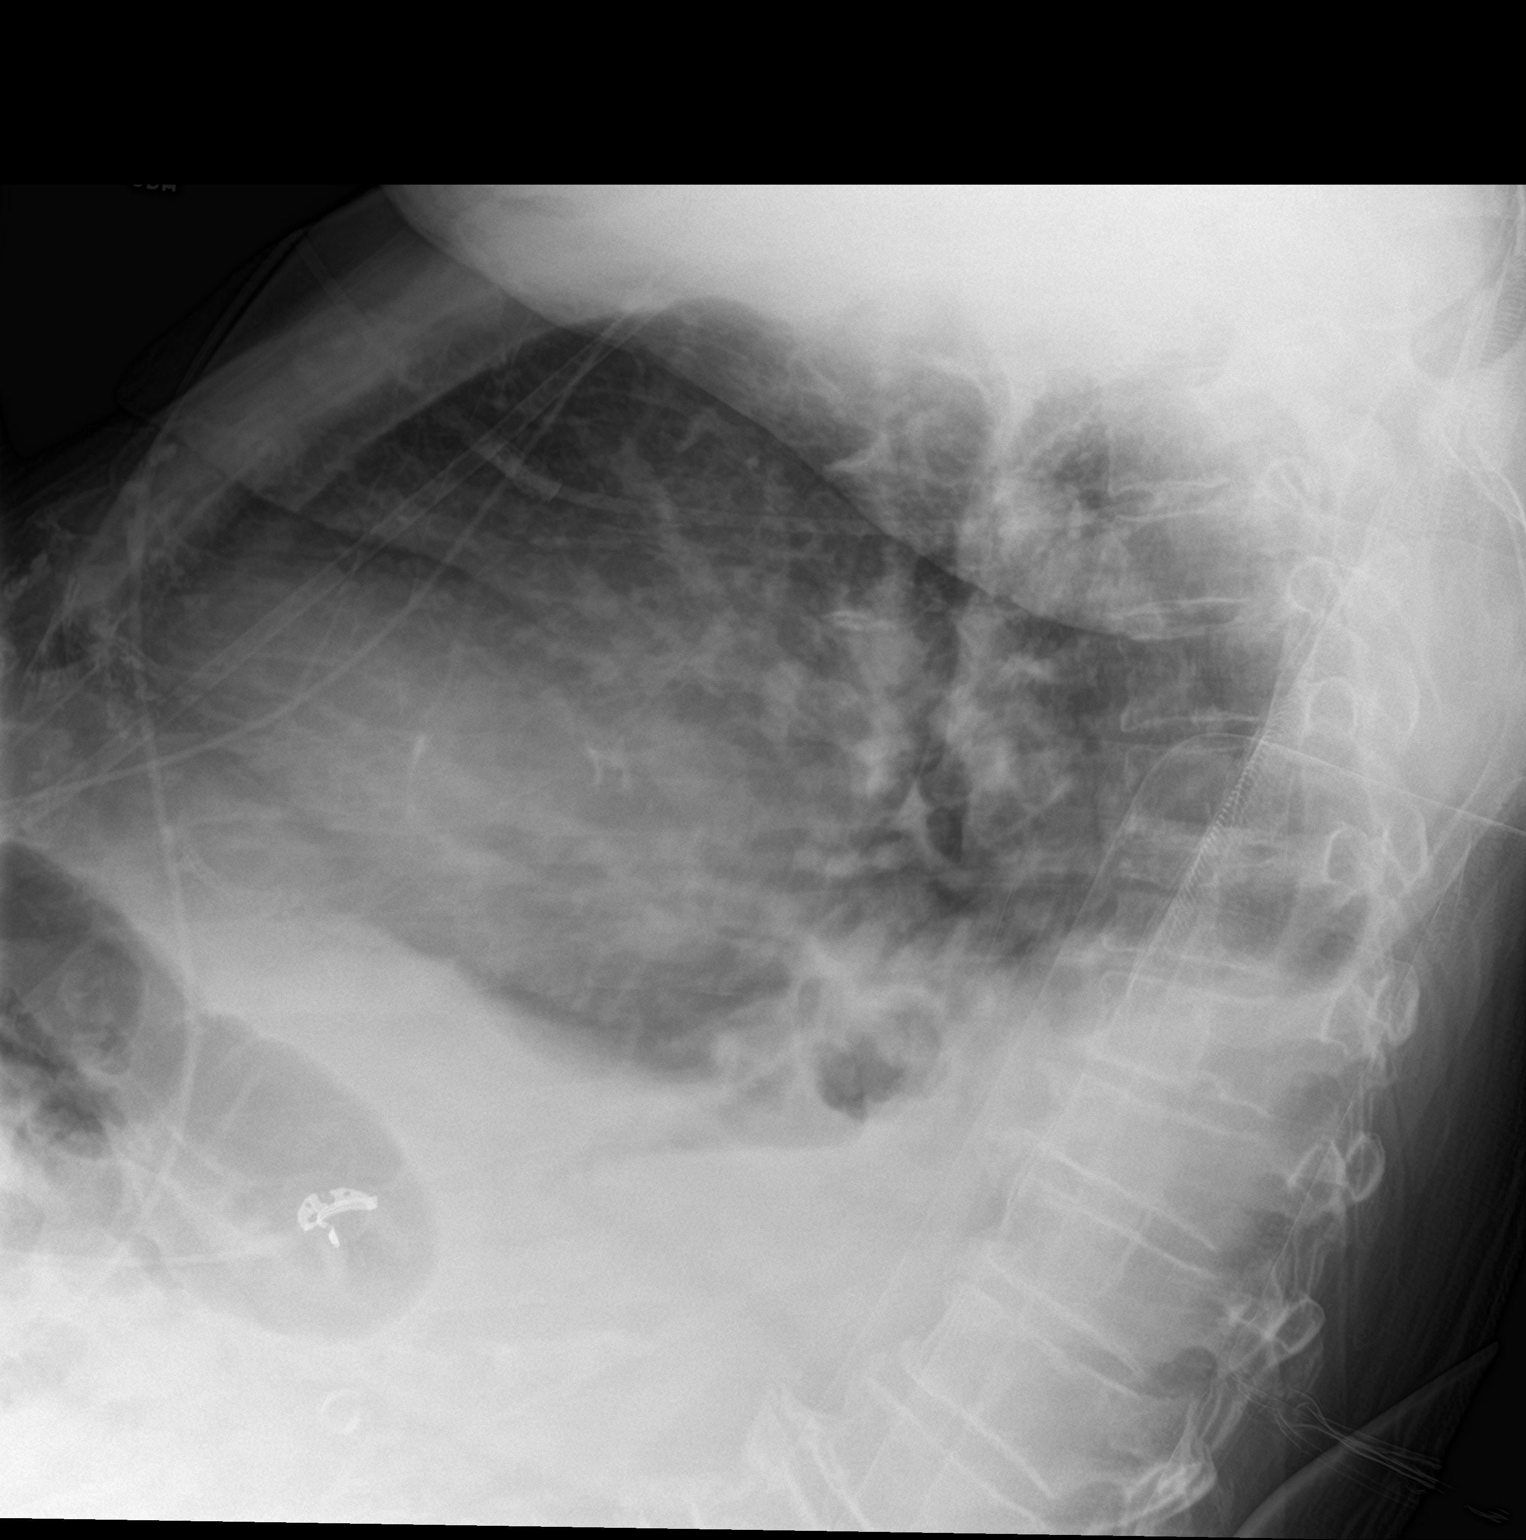

[chest ap]
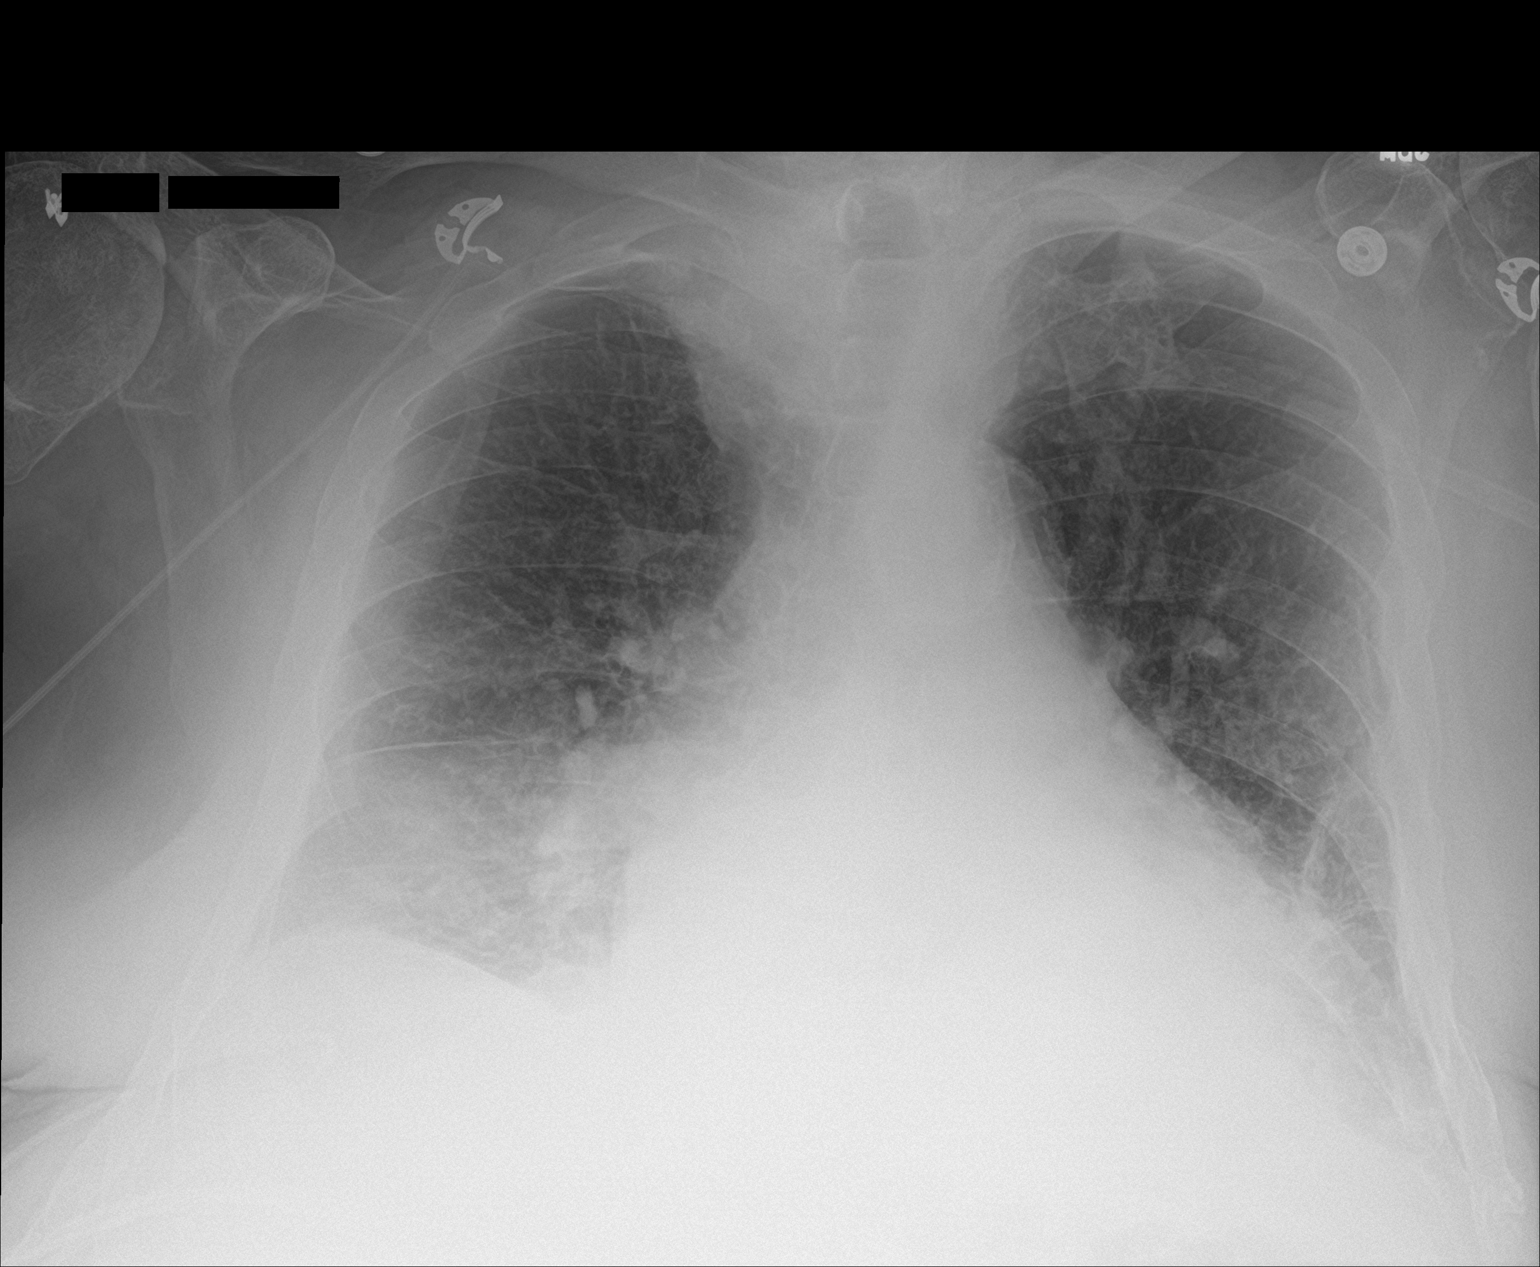

[2 of 2 positions shown; findings below may reference images not displayed]

FINDINGS: Cardiomegaly and large hiatal hernia. Pulmonary vascular congestion
and hazy basilar opacity that is likely atelectasis. Posttraumatic
deformity of the left chest wall. Chronic upper mediastinal widening
from ectatic vasculature based on recent chest CT.
IMPRESSION: Cardiomegaly and pulmonary venous congestion.

Intrathoracic stomach.

## 2018-05-26 DIAGNOSIS — I4891 Unspecified atrial fibrillation: Secondary | ICD-10-CM | POA: Diagnosis not present

## 2018-05-26 DIAGNOSIS — J449 Chronic obstructive pulmonary disease, unspecified: Secondary | ICD-10-CM | POA: Diagnosis not present

## 2018-05-26 DIAGNOSIS — H3530 Unspecified macular degeneration: Secondary | ICD-10-CM | POA: Diagnosis not present

## 2018-05-26 DIAGNOSIS — I130 Hypertensive heart and chronic kidney disease with heart failure and stage 1 through stage 4 chronic kidney disease, or unspecified chronic kidney disease: Secondary | ICD-10-CM | POA: Diagnosis not present

## 2018-05-26 DIAGNOSIS — I5032 Chronic diastolic (congestive) heart failure: Secondary | ICD-10-CM | POA: Diagnosis not present

## 2018-05-26 DIAGNOSIS — E785 Hyperlipidemia, unspecified: Secondary | ICD-10-CM | POA: Diagnosis not present

## 2018-05-26 DIAGNOSIS — N183 Chronic kidney disease, stage 3 (moderate): Secondary | ICD-10-CM | POA: Diagnosis not present

## 2018-05-26 DIAGNOSIS — Z7901 Long term (current) use of anticoagulants: Secondary | ICD-10-CM | POA: Diagnosis not present

## 2018-05-26 DIAGNOSIS — H548 Legal blindness, as defined in USA: Secondary | ICD-10-CM | POA: Diagnosis not present

## 2018-05-26 DIAGNOSIS — D509 Iron deficiency anemia, unspecified: Secondary | ICD-10-CM | POA: Diagnosis not present

## 2018-05-26 DIAGNOSIS — I700 Atherosclerosis of aorta: Secondary | ICD-10-CM | POA: Diagnosis not present

## 2018-05-26 DIAGNOSIS — I4510 Unspecified right bundle-branch block: Secondary | ICD-10-CM | POA: Diagnosis not present

## 2018-05-26 DIAGNOSIS — Z791 Long term (current) use of non-steroidal anti-inflammatories (NSAID): Secondary | ICD-10-CM | POA: Diagnosis not present

## 2018-05-27 DIAGNOSIS — I10 Essential (primary) hypertension: Secondary | ICD-10-CM | POA: Diagnosis not present

## 2018-05-27 DIAGNOSIS — D509 Iron deficiency anemia, unspecified: Secondary | ICD-10-CM | POA: Diagnosis not present

## 2018-05-27 DIAGNOSIS — E785 Hyperlipidemia, unspecified: Secondary | ICD-10-CM | POA: Diagnosis not present

## 2018-05-27 DIAGNOSIS — I5032 Chronic diastolic (congestive) heart failure: Secondary | ICD-10-CM | POA: Diagnosis not present

## 2018-05-27 DIAGNOSIS — Z1339 Encounter for screening examination for other mental health and behavioral disorders: Secondary | ICD-10-CM | POA: Diagnosis not present

## 2018-05-27 IMAGING — DX DG CHEST 2V
2 series · 2 of 2 positions shown · non-contrast
Comparison: 11/13/2016

CLINICAL DATA: Shortness of breath.  History of recent pneumonia.

EXAM:
CHEST  2 VIEW

[w chest lat]
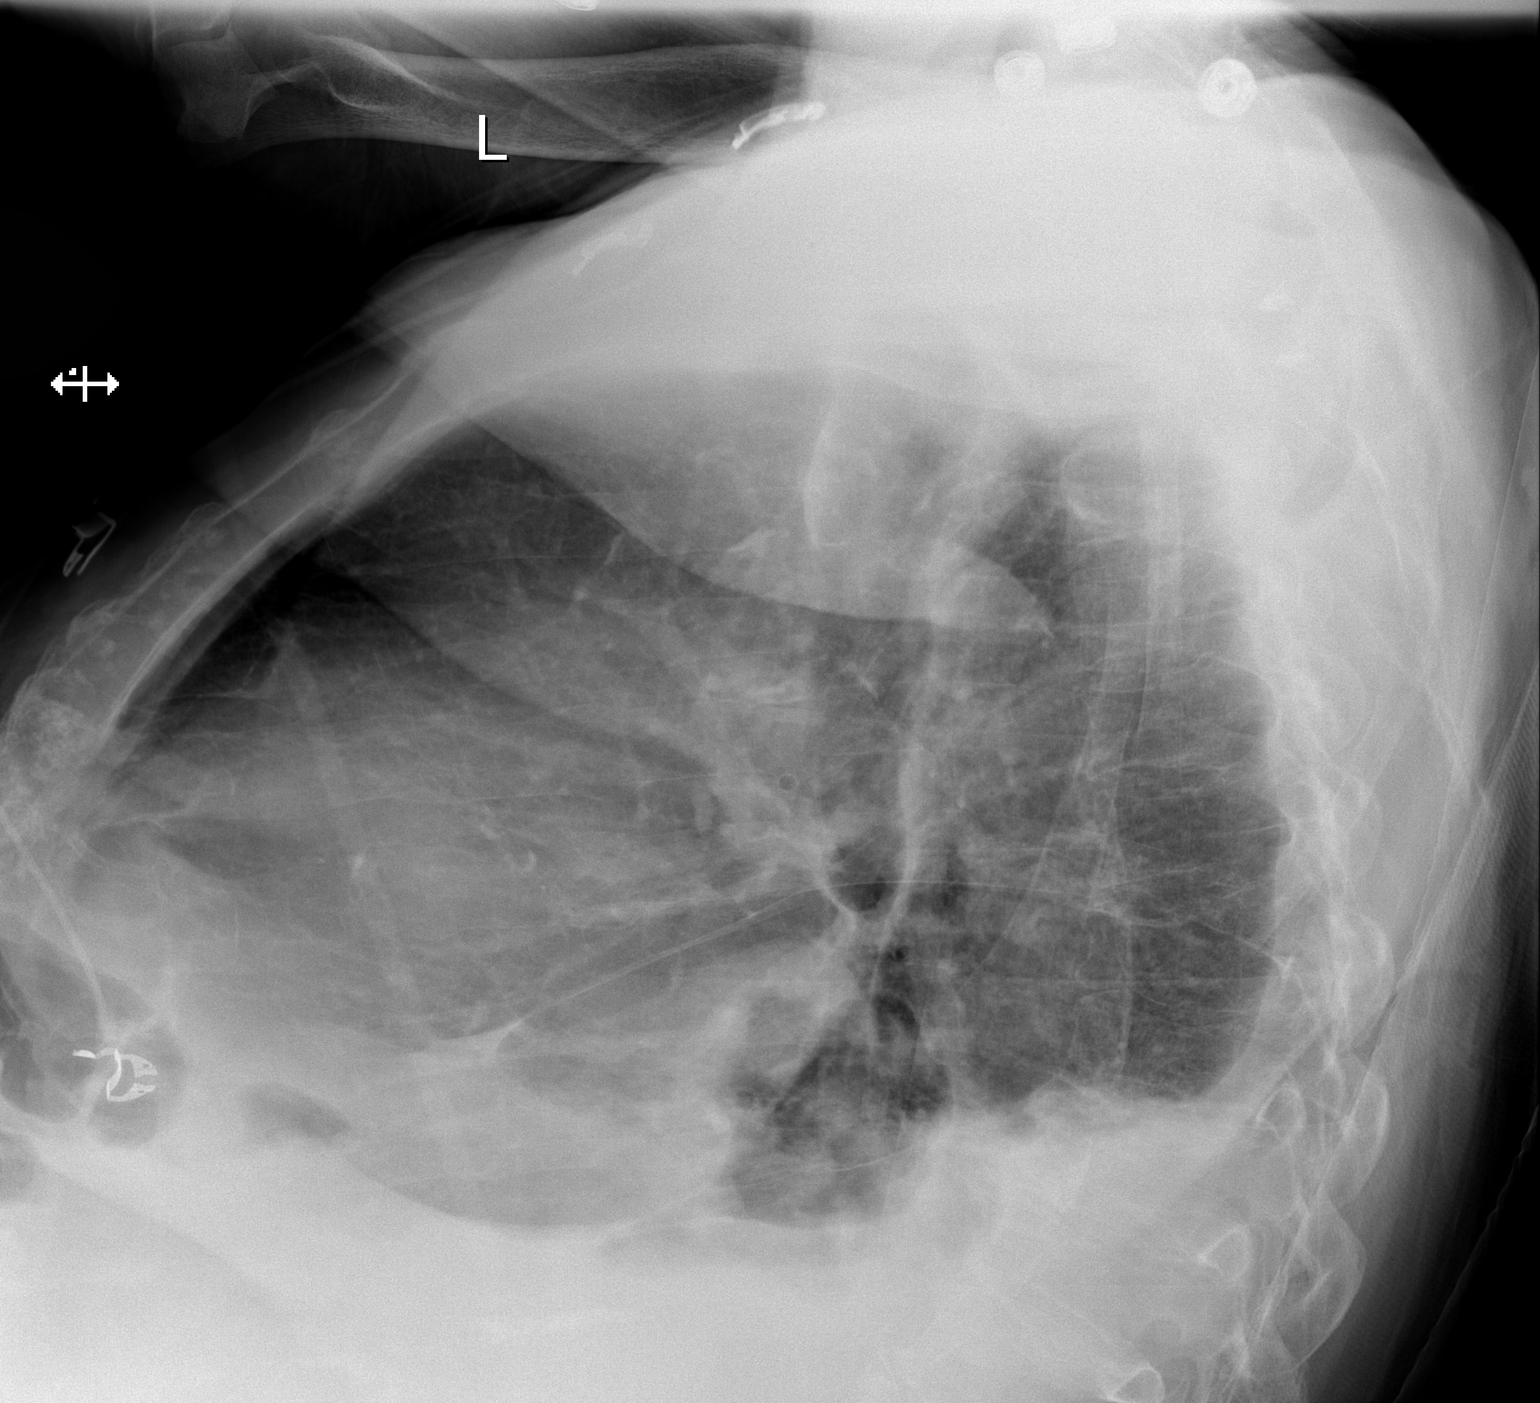

[x chest ap]
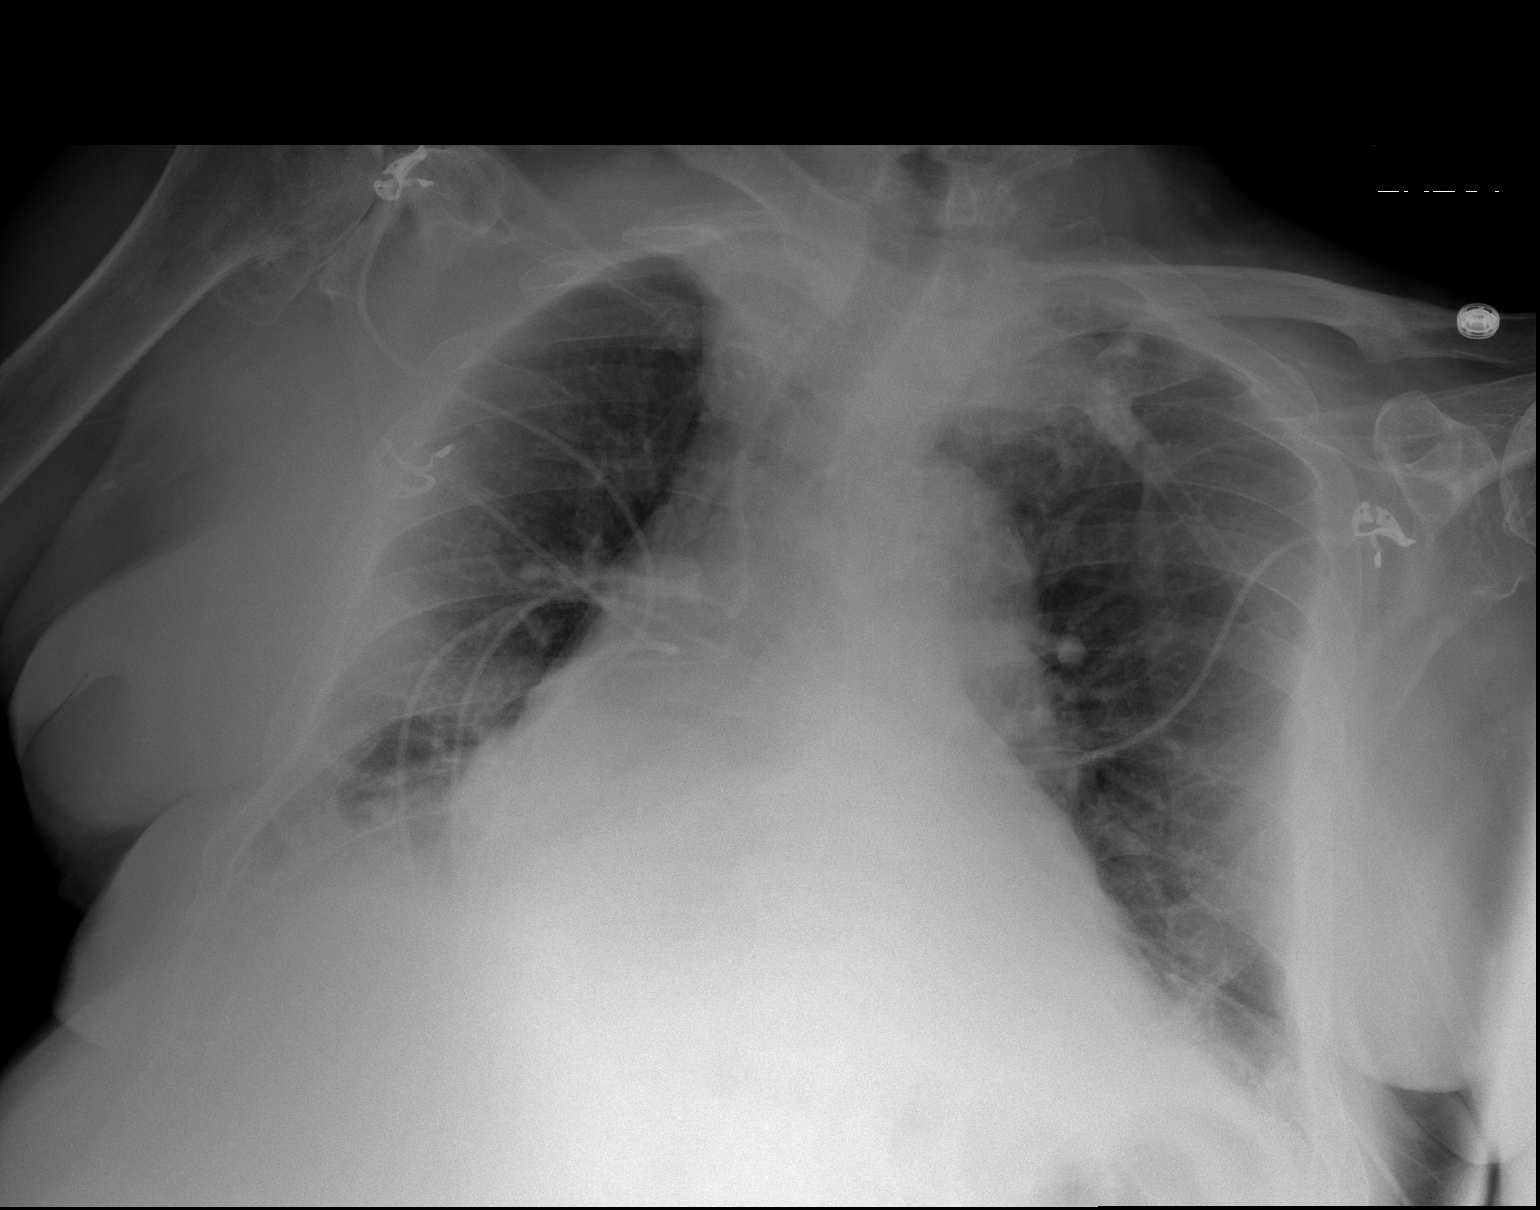

[2 of 2 positions shown; findings below may reference images not displayed]

FINDINGS: The heart is enlarged but stable. Stable tortuosity and ectasia of
the thoracic aorta. Persistent bibasilar infiltrates or atelectasis
and small effusions.
IMPRESSION: Persistent bibasilar process and small effusions.

Stable cardiac enlargement.  No pulmonary edema.

## 2018-05-30 DIAGNOSIS — I4510 Unspecified right bundle-branch block: Secondary | ICD-10-CM | POA: Diagnosis not present

## 2018-05-30 DIAGNOSIS — I130 Hypertensive heart and chronic kidney disease with heart failure and stage 1 through stage 4 chronic kidney disease, or unspecified chronic kidney disease: Secondary | ICD-10-CM | POA: Diagnosis not present

## 2018-05-30 DIAGNOSIS — I700 Atherosclerosis of aorta: Secondary | ICD-10-CM | POA: Diagnosis not present

## 2018-05-30 DIAGNOSIS — N183 Chronic kidney disease, stage 3 (moderate): Secondary | ICD-10-CM | POA: Diagnosis not present

## 2018-05-30 DIAGNOSIS — Z7901 Long term (current) use of anticoagulants: Secondary | ICD-10-CM | POA: Diagnosis not present

## 2018-05-30 DIAGNOSIS — H548 Legal blindness, as defined in USA: Secondary | ICD-10-CM | POA: Diagnosis not present

## 2018-05-30 DIAGNOSIS — I4891 Unspecified atrial fibrillation: Secondary | ICD-10-CM | POA: Diagnosis not present

## 2018-05-30 DIAGNOSIS — H3530 Unspecified macular degeneration: Secondary | ICD-10-CM | POA: Diagnosis not present

## 2018-05-30 DIAGNOSIS — J449 Chronic obstructive pulmonary disease, unspecified: Secondary | ICD-10-CM | POA: Diagnosis not present

## 2018-05-30 DIAGNOSIS — I5032 Chronic diastolic (congestive) heart failure: Secondary | ICD-10-CM | POA: Diagnosis not present

## 2018-05-30 DIAGNOSIS — Z791 Long term (current) use of non-steroidal anti-inflammatories (NSAID): Secondary | ICD-10-CM | POA: Diagnosis not present

## 2018-05-30 DIAGNOSIS — D509 Iron deficiency anemia, unspecified: Secondary | ICD-10-CM | POA: Diagnosis not present

## 2018-05-30 DIAGNOSIS — E785 Hyperlipidemia, unspecified: Secondary | ICD-10-CM | POA: Diagnosis not present

## 2018-05-31 DIAGNOSIS — E785 Hyperlipidemia, unspecified: Secondary | ICD-10-CM | POA: Diagnosis not present

## 2018-05-31 DIAGNOSIS — I4510 Unspecified right bundle-branch block: Secondary | ICD-10-CM | POA: Diagnosis not present

## 2018-05-31 DIAGNOSIS — J449 Chronic obstructive pulmonary disease, unspecified: Secondary | ICD-10-CM | POA: Diagnosis not present

## 2018-05-31 DIAGNOSIS — Z7901 Long term (current) use of anticoagulants: Secondary | ICD-10-CM | POA: Diagnosis not present

## 2018-05-31 DIAGNOSIS — H548 Legal blindness, as defined in USA: Secondary | ICD-10-CM | POA: Diagnosis not present

## 2018-05-31 DIAGNOSIS — I5032 Chronic diastolic (congestive) heart failure: Secondary | ICD-10-CM | POA: Diagnosis not present

## 2018-05-31 DIAGNOSIS — H3530 Unspecified macular degeneration: Secondary | ICD-10-CM | POA: Diagnosis not present

## 2018-05-31 DIAGNOSIS — I700 Atherosclerosis of aorta: Secondary | ICD-10-CM | POA: Diagnosis not present

## 2018-05-31 DIAGNOSIS — Z791 Long term (current) use of non-steroidal anti-inflammatories (NSAID): Secondary | ICD-10-CM | POA: Diagnosis not present

## 2018-05-31 DIAGNOSIS — I4891 Unspecified atrial fibrillation: Secondary | ICD-10-CM | POA: Diagnosis not present

## 2018-05-31 DIAGNOSIS — D509 Iron deficiency anemia, unspecified: Secondary | ICD-10-CM | POA: Diagnosis not present

## 2018-05-31 DIAGNOSIS — I130 Hypertensive heart and chronic kidney disease with heart failure and stage 1 through stage 4 chronic kidney disease, or unspecified chronic kidney disease: Secondary | ICD-10-CM | POA: Diagnosis not present

## 2018-05-31 DIAGNOSIS — N183 Chronic kidney disease, stage 3 (moderate): Secondary | ICD-10-CM | POA: Diagnosis not present

## 2018-06-01 DIAGNOSIS — N183 Chronic kidney disease, stage 3 (moderate): Secondary | ICD-10-CM | POA: Diagnosis not present

## 2018-06-01 DIAGNOSIS — E785 Hyperlipidemia, unspecified: Secondary | ICD-10-CM | POA: Diagnosis not present

## 2018-06-01 DIAGNOSIS — J449 Chronic obstructive pulmonary disease, unspecified: Secondary | ICD-10-CM | POA: Diagnosis not present

## 2018-06-01 DIAGNOSIS — I4510 Unspecified right bundle-branch block: Secondary | ICD-10-CM | POA: Diagnosis not present

## 2018-06-01 DIAGNOSIS — Z791 Long term (current) use of non-steroidal anti-inflammatories (NSAID): Secondary | ICD-10-CM | POA: Diagnosis not present

## 2018-06-01 DIAGNOSIS — H3530 Unspecified macular degeneration: Secondary | ICD-10-CM | POA: Diagnosis not present

## 2018-06-01 DIAGNOSIS — D509 Iron deficiency anemia, unspecified: Secondary | ICD-10-CM | POA: Diagnosis not present

## 2018-06-01 DIAGNOSIS — Z7901 Long term (current) use of anticoagulants: Secondary | ICD-10-CM | POA: Diagnosis not present

## 2018-06-01 DIAGNOSIS — I130 Hypertensive heart and chronic kidney disease with heart failure and stage 1 through stage 4 chronic kidney disease, or unspecified chronic kidney disease: Secondary | ICD-10-CM | POA: Diagnosis not present

## 2018-06-01 DIAGNOSIS — I5032 Chronic diastolic (congestive) heart failure: Secondary | ICD-10-CM | POA: Diagnosis not present

## 2018-06-01 DIAGNOSIS — I700 Atherosclerosis of aorta: Secondary | ICD-10-CM | POA: Diagnosis not present

## 2018-06-01 DIAGNOSIS — I4891 Unspecified atrial fibrillation: Secondary | ICD-10-CM | POA: Diagnosis not present

## 2018-06-01 DIAGNOSIS — H548 Legal blindness, as defined in USA: Secondary | ICD-10-CM | POA: Diagnosis not present

## 2018-06-02 DIAGNOSIS — Z791 Long term (current) use of non-steroidal anti-inflammatories (NSAID): Secondary | ICD-10-CM | POA: Diagnosis not present

## 2018-06-02 DIAGNOSIS — I4891 Unspecified atrial fibrillation: Secondary | ICD-10-CM | POA: Diagnosis not present

## 2018-06-02 DIAGNOSIS — I5032 Chronic diastolic (congestive) heart failure: Secondary | ICD-10-CM | POA: Diagnosis not present

## 2018-06-02 DIAGNOSIS — J449 Chronic obstructive pulmonary disease, unspecified: Secondary | ICD-10-CM | POA: Diagnosis not present

## 2018-06-02 DIAGNOSIS — I130 Hypertensive heart and chronic kidney disease with heart failure and stage 1 through stage 4 chronic kidney disease, or unspecified chronic kidney disease: Secondary | ICD-10-CM | POA: Diagnosis not present

## 2018-06-02 DIAGNOSIS — I700 Atherosclerosis of aorta: Secondary | ICD-10-CM | POA: Diagnosis not present

## 2018-06-02 DIAGNOSIS — H3530 Unspecified macular degeneration: Secondary | ICD-10-CM | POA: Diagnosis not present

## 2018-06-02 DIAGNOSIS — D509 Iron deficiency anemia, unspecified: Secondary | ICD-10-CM | POA: Diagnosis not present

## 2018-06-02 DIAGNOSIS — I4510 Unspecified right bundle-branch block: Secondary | ICD-10-CM | POA: Diagnosis not present

## 2018-06-02 DIAGNOSIS — E785 Hyperlipidemia, unspecified: Secondary | ICD-10-CM | POA: Diagnosis not present

## 2018-06-02 DIAGNOSIS — H548 Legal blindness, as defined in USA: Secondary | ICD-10-CM | POA: Diagnosis not present

## 2018-06-02 DIAGNOSIS — N183 Chronic kidney disease, stage 3 (moderate): Secondary | ICD-10-CM | POA: Diagnosis not present

## 2018-06-02 DIAGNOSIS — Z7901 Long term (current) use of anticoagulants: Secondary | ICD-10-CM | POA: Diagnosis not present

## 2018-06-03 DIAGNOSIS — I4510 Unspecified right bundle-branch block: Secondary | ICD-10-CM | POA: Diagnosis not present

## 2018-06-03 DIAGNOSIS — I130 Hypertensive heart and chronic kidney disease with heart failure and stage 1 through stage 4 chronic kidney disease, or unspecified chronic kidney disease: Secondary | ICD-10-CM | POA: Diagnosis not present

## 2018-06-03 DIAGNOSIS — H3530 Unspecified macular degeneration: Secondary | ICD-10-CM | POA: Diagnosis not present

## 2018-06-03 DIAGNOSIS — I700 Atherosclerosis of aorta: Secondary | ICD-10-CM | POA: Diagnosis not present

## 2018-06-03 DIAGNOSIS — I4891 Unspecified atrial fibrillation: Secondary | ICD-10-CM | POA: Diagnosis not present

## 2018-06-03 DIAGNOSIS — N183 Chronic kidney disease, stage 3 (moderate): Secondary | ICD-10-CM | POA: Diagnosis not present

## 2018-06-03 DIAGNOSIS — Z791 Long term (current) use of non-steroidal anti-inflammatories (NSAID): Secondary | ICD-10-CM | POA: Diagnosis not present

## 2018-06-03 DIAGNOSIS — I5032 Chronic diastolic (congestive) heart failure: Secondary | ICD-10-CM | POA: Diagnosis not present

## 2018-06-03 DIAGNOSIS — J449 Chronic obstructive pulmonary disease, unspecified: Secondary | ICD-10-CM | POA: Diagnosis not present

## 2018-06-03 DIAGNOSIS — H548 Legal blindness, as defined in USA: Secondary | ICD-10-CM | POA: Diagnosis not present

## 2018-06-03 DIAGNOSIS — Z7901 Long term (current) use of anticoagulants: Secondary | ICD-10-CM | POA: Diagnosis not present

## 2018-06-03 DIAGNOSIS — E785 Hyperlipidemia, unspecified: Secondary | ICD-10-CM | POA: Diagnosis not present

## 2018-06-03 DIAGNOSIS — D509 Iron deficiency anemia, unspecified: Secondary | ICD-10-CM | POA: Diagnosis not present

## 2018-06-06 DIAGNOSIS — I130 Hypertensive heart and chronic kidney disease with heart failure and stage 1 through stage 4 chronic kidney disease, or unspecified chronic kidney disease: Secondary | ICD-10-CM | POA: Diagnosis not present

## 2018-06-06 DIAGNOSIS — N183 Chronic kidney disease, stage 3 (moderate): Secondary | ICD-10-CM | POA: Diagnosis not present

## 2018-06-06 DIAGNOSIS — E785 Hyperlipidemia, unspecified: Secondary | ICD-10-CM | POA: Diagnosis not present

## 2018-06-06 DIAGNOSIS — J449 Chronic obstructive pulmonary disease, unspecified: Secondary | ICD-10-CM | POA: Diagnosis not present

## 2018-06-06 DIAGNOSIS — H548 Legal blindness, as defined in USA: Secondary | ICD-10-CM | POA: Diagnosis not present

## 2018-06-06 DIAGNOSIS — I700 Atherosclerosis of aorta: Secondary | ICD-10-CM | POA: Diagnosis not present

## 2018-06-06 DIAGNOSIS — I4891 Unspecified atrial fibrillation: Secondary | ICD-10-CM | POA: Diagnosis not present

## 2018-06-06 DIAGNOSIS — I4510 Unspecified right bundle-branch block: Secondary | ICD-10-CM | POA: Diagnosis not present

## 2018-06-06 DIAGNOSIS — D509 Iron deficiency anemia, unspecified: Secondary | ICD-10-CM | POA: Diagnosis not present

## 2018-06-06 DIAGNOSIS — Z791 Long term (current) use of non-steroidal anti-inflammatories (NSAID): Secondary | ICD-10-CM | POA: Diagnosis not present

## 2018-06-06 DIAGNOSIS — Z7901 Long term (current) use of anticoagulants: Secondary | ICD-10-CM | POA: Diagnosis not present

## 2018-06-06 DIAGNOSIS — I5032 Chronic diastolic (congestive) heart failure: Secondary | ICD-10-CM | POA: Diagnosis not present

## 2018-06-06 DIAGNOSIS — H3530 Unspecified macular degeneration: Secondary | ICD-10-CM | POA: Diagnosis not present

## 2018-06-07 DIAGNOSIS — Z7901 Long term (current) use of anticoagulants: Secondary | ICD-10-CM | POA: Diagnosis not present

## 2018-06-07 DIAGNOSIS — D509 Iron deficiency anemia, unspecified: Secondary | ICD-10-CM | POA: Diagnosis not present

## 2018-06-07 DIAGNOSIS — H3530 Unspecified macular degeneration: Secondary | ICD-10-CM | POA: Diagnosis not present

## 2018-06-07 DIAGNOSIS — H548 Legal blindness, as defined in USA: Secondary | ICD-10-CM | POA: Diagnosis not present

## 2018-06-07 DIAGNOSIS — E785 Hyperlipidemia, unspecified: Secondary | ICD-10-CM | POA: Diagnosis not present

## 2018-06-07 DIAGNOSIS — J449 Chronic obstructive pulmonary disease, unspecified: Secondary | ICD-10-CM | POA: Diagnosis not present

## 2018-06-07 DIAGNOSIS — I5032 Chronic diastolic (congestive) heart failure: Secondary | ICD-10-CM | POA: Diagnosis not present

## 2018-06-07 DIAGNOSIS — I130 Hypertensive heart and chronic kidney disease with heart failure and stage 1 through stage 4 chronic kidney disease, or unspecified chronic kidney disease: Secondary | ICD-10-CM | POA: Diagnosis not present

## 2018-06-07 DIAGNOSIS — I700 Atherosclerosis of aorta: Secondary | ICD-10-CM | POA: Diagnosis not present

## 2018-06-07 DIAGNOSIS — N183 Chronic kidney disease, stage 3 (moderate): Secondary | ICD-10-CM | POA: Diagnosis not present

## 2018-06-07 DIAGNOSIS — I4891 Unspecified atrial fibrillation: Secondary | ICD-10-CM | POA: Diagnosis not present

## 2018-06-07 DIAGNOSIS — I4510 Unspecified right bundle-branch block: Secondary | ICD-10-CM | POA: Diagnosis not present

## 2018-06-07 DIAGNOSIS — Z791 Long term (current) use of non-steroidal anti-inflammatories (NSAID): Secondary | ICD-10-CM | POA: Diagnosis not present

## 2018-06-08 DIAGNOSIS — I4891 Unspecified atrial fibrillation: Secondary | ICD-10-CM | POA: Diagnosis not present

## 2018-06-08 DIAGNOSIS — J449 Chronic obstructive pulmonary disease, unspecified: Secondary | ICD-10-CM | POA: Diagnosis not present

## 2018-06-08 DIAGNOSIS — I700 Atherosclerosis of aorta: Secondary | ICD-10-CM | POA: Diagnosis not present

## 2018-06-08 DIAGNOSIS — Z791 Long term (current) use of non-steroidal anti-inflammatories (NSAID): Secondary | ICD-10-CM | POA: Diagnosis not present

## 2018-06-08 DIAGNOSIS — H548 Legal blindness, as defined in USA: Secondary | ICD-10-CM | POA: Diagnosis not present

## 2018-06-08 DIAGNOSIS — I5032 Chronic diastolic (congestive) heart failure: Secondary | ICD-10-CM | POA: Diagnosis not present

## 2018-06-08 DIAGNOSIS — E785 Hyperlipidemia, unspecified: Secondary | ICD-10-CM | POA: Diagnosis not present

## 2018-06-08 DIAGNOSIS — I4510 Unspecified right bundle-branch block: Secondary | ICD-10-CM | POA: Diagnosis not present

## 2018-06-08 DIAGNOSIS — I130 Hypertensive heart and chronic kidney disease with heart failure and stage 1 through stage 4 chronic kidney disease, or unspecified chronic kidney disease: Secondary | ICD-10-CM | POA: Diagnosis not present

## 2018-06-08 DIAGNOSIS — N183 Chronic kidney disease, stage 3 (moderate): Secondary | ICD-10-CM | POA: Diagnosis not present

## 2018-06-08 DIAGNOSIS — H3530 Unspecified macular degeneration: Secondary | ICD-10-CM | POA: Diagnosis not present

## 2018-06-08 DIAGNOSIS — D509 Iron deficiency anemia, unspecified: Secondary | ICD-10-CM | POA: Diagnosis not present

## 2018-06-08 DIAGNOSIS — Z7901 Long term (current) use of anticoagulants: Secondary | ICD-10-CM | POA: Diagnosis not present

## 2018-06-09 DIAGNOSIS — H548 Legal blindness, as defined in USA: Secondary | ICD-10-CM | POA: Diagnosis not present

## 2018-06-09 DIAGNOSIS — E785 Hyperlipidemia, unspecified: Secondary | ICD-10-CM | POA: Diagnosis not present

## 2018-06-09 DIAGNOSIS — D509 Iron deficiency anemia, unspecified: Secondary | ICD-10-CM | POA: Diagnosis not present

## 2018-06-09 DIAGNOSIS — Z7901 Long term (current) use of anticoagulants: Secondary | ICD-10-CM | POA: Diagnosis not present

## 2018-06-09 DIAGNOSIS — J449 Chronic obstructive pulmonary disease, unspecified: Secondary | ICD-10-CM | POA: Diagnosis not present

## 2018-06-09 DIAGNOSIS — H3530 Unspecified macular degeneration: Secondary | ICD-10-CM | POA: Diagnosis not present

## 2018-06-09 DIAGNOSIS — I5032 Chronic diastolic (congestive) heart failure: Secondary | ICD-10-CM | POA: Diagnosis not present

## 2018-06-09 DIAGNOSIS — I4510 Unspecified right bundle-branch block: Secondary | ICD-10-CM | POA: Diagnosis not present

## 2018-06-09 DIAGNOSIS — Z791 Long term (current) use of non-steroidal anti-inflammatories (NSAID): Secondary | ICD-10-CM | POA: Diagnosis not present

## 2018-06-09 DIAGNOSIS — N183 Chronic kidney disease, stage 3 (moderate): Secondary | ICD-10-CM | POA: Diagnosis not present

## 2018-06-09 DIAGNOSIS — I700 Atherosclerosis of aorta: Secondary | ICD-10-CM | POA: Diagnosis not present

## 2018-06-09 DIAGNOSIS — I130 Hypertensive heart and chronic kidney disease with heart failure and stage 1 through stage 4 chronic kidney disease, or unspecified chronic kidney disease: Secondary | ICD-10-CM | POA: Diagnosis not present

## 2018-06-09 DIAGNOSIS — I4891 Unspecified atrial fibrillation: Secondary | ICD-10-CM | POA: Diagnosis not present

## 2018-06-10 DIAGNOSIS — N183 Chronic kidney disease, stage 3 (moderate): Secondary | ICD-10-CM | POA: Diagnosis not present

## 2018-06-10 DIAGNOSIS — I4891 Unspecified atrial fibrillation: Secondary | ICD-10-CM | POA: Diagnosis not present

## 2018-06-10 DIAGNOSIS — J449 Chronic obstructive pulmonary disease, unspecified: Secondary | ICD-10-CM | POA: Diagnosis not present

## 2018-06-10 DIAGNOSIS — Z7901 Long term (current) use of anticoagulants: Secondary | ICD-10-CM | POA: Diagnosis not present

## 2018-06-10 DIAGNOSIS — H3530 Unspecified macular degeneration: Secondary | ICD-10-CM | POA: Diagnosis not present

## 2018-06-10 DIAGNOSIS — Z791 Long term (current) use of non-steroidal anti-inflammatories (NSAID): Secondary | ICD-10-CM | POA: Diagnosis not present

## 2018-06-10 DIAGNOSIS — I5032 Chronic diastolic (congestive) heart failure: Secondary | ICD-10-CM | POA: Diagnosis not present

## 2018-06-10 DIAGNOSIS — D509 Iron deficiency anemia, unspecified: Secondary | ICD-10-CM | POA: Diagnosis not present

## 2018-06-10 DIAGNOSIS — I700 Atherosclerosis of aorta: Secondary | ICD-10-CM | POA: Diagnosis not present

## 2018-06-10 DIAGNOSIS — H548 Legal blindness, as defined in USA: Secondary | ICD-10-CM | POA: Diagnosis not present

## 2018-06-10 DIAGNOSIS — E785 Hyperlipidemia, unspecified: Secondary | ICD-10-CM | POA: Diagnosis not present

## 2018-06-10 DIAGNOSIS — I130 Hypertensive heart and chronic kidney disease with heart failure and stage 1 through stage 4 chronic kidney disease, or unspecified chronic kidney disease: Secondary | ICD-10-CM | POA: Diagnosis not present

## 2018-06-10 DIAGNOSIS — I4510 Unspecified right bundle-branch block: Secondary | ICD-10-CM | POA: Diagnosis not present

## 2018-06-14 DIAGNOSIS — Z7901 Long term (current) use of anticoagulants: Secondary | ICD-10-CM | POA: Diagnosis not present

## 2018-06-14 DIAGNOSIS — N183 Chronic kidney disease, stage 3 (moderate): Secondary | ICD-10-CM | POA: Diagnosis not present

## 2018-06-14 DIAGNOSIS — H3530 Unspecified macular degeneration: Secondary | ICD-10-CM | POA: Diagnosis not present

## 2018-06-14 DIAGNOSIS — D509 Iron deficiency anemia, unspecified: Secondary | ICD-10-CM | POA: Diagnosis not present

## 2018-06-14 DIAGNOSIS — H548 Legal blindness, as defined in USA: Secondary | ICD-10-CM | POA: Diagnosis not present

## 2018-06-14 DIAGNOSIS — Z791 Long term (current) use of non-steroidal anti-inflammatories (NSAID): Secondary | ICD-10-CM | POA: Diagnosis not present

## 2018-06-14 DIAGNOSIS — I4891 Unspecified atrial fibrillation: Secondary | ICD-10-CM | POA: Diagnosis not present

## 2018-06-14 DIAGNOSIS — E785 Hyperlipidemia, unspecified: Secondary | ICD-10-CM | POA: Diagnosis not present

## 2018-06-14 DIAGNOSIS — J449 Chronic obstructive pulmonary disease, unspecified: Secondary | ICD-10-CM | POA: Diagnosis not present

## 2018-06-14 DIAGNOSIS — I5032 Chronic diastolic (congestive) heart failure: Secondary | ICD-10-CM | POA: Diagnosis not present

## 2018-06-14 DIAGNOSIS — I700 Atherosclerosis of aorta: Secondary | ICD-10-CM | POA: Diagnosis not present

## 2018-06-14 DIAGNOSIS — I4510 Unspecified right bundle-branch block: Secondary | ICD-10-CM | POA: Diagnosis not present

## 2018-06-14 DIAGNOSIS — I130 Hypertensive heart and chronic kidney disease with heart failure and stage 1 through stage 4 chronic kidney disease, or unspecified chronic kidney disease: Secondary | ICD-10-CM | POA: Diagnosis not present

## 2018-06-16 DIAGNOSIS — H3530 Unspecified macular degeneration: Secondary | ICD-10-CM | POA: Diagnosis not present

## 2018-06-16 DIAGNOSIS — I4891 Unspecified atrial fibrillation: Secondary | ICD-10-CM | POA: Diagnosis not present

## 2018-06-16 DIAGNOSIS — E785 Hyperlipidemia, unspecified: Secondary | ICD-10-CM | POA: Diagnosis not present

## 2018-06-16 DIAGNOSIS — I700 Atherosclerosis of aorta: Secondary | ICD-10-CM | POA: Diagnosis not present

## 2018-06-16 DIAGNOSIS — I5032 Chronic diastolic (congestive) heart failure: Secondary | ICD-10-CM | POA: Diagnosis not present

## 2018-06-16 DIAGNOSIS — H548 Legal blindness, as defined in USA: Secondary | ICD-10-CM | POA: Diagnosis not present

## 2018-06-16 DIAGNOSIS — I130 Hypertensive heart and chronic kidney disease with heart failure and stage 1 through stage 4 chronic kidney disease, or unspecified chronic kidney disease: Secondary | ICD-10-CM | POA: Diagnosis not present

## 2018-06-16 DIAGNOSIS — N183 Chronic kidney disease, stage 3 (moderate): Secondary | ICD-10-CM | POA: Diagnosis not present

## 2018-06-16 DIAGNOSIS — I4510 Unspecified right bundle-branch block: Secondary | ICD-10-CM | POA: Diagnosis not present

## 2018-06-16 DIAGNOSIS — J449 Chronic obstructive pulmonary disease, unspecified: Secondary | ICD-10-CM | POA: Diagnosis not present

## 2018-06-16 DIAGNOSIS — Z7901 Long term (current) use of anticoagulants: Secondary | ICD-10-CM | POA: Diagnosis not present

## 2018-06-16 DIAGNOSIS — D509 Iron deficiency anemia, unspecified: Secondary | ICD-10-CM | POA: Diagnosis not present

## 2018-06-16 DIAGNOSIS — Z791 Long term (current) use of non-steroidal anti-inflammatories (NSAID): Secondary | ICD-10-CM | POA: Diagnosis not present

## 2018-06-20 DIAGNOSIS — H548 Legal blindness, as defined in USA: Secondary | ICD-10-CM | POA: Diagnosis not present

## 2018-06-20 DIAGNOSIS — J449 Chronic obstructive pulmonary disease, unspecified: Secondary | ICD-10-CM | POA: Diagnosis not present

## 2018-06-20 DIAGNOSIS — H3530 Unspecified macular degeneration: Secondary | ICD-10-CM | POA: Diagnosis not present

## 2018-06-20 DIAGNOSIS — D509 Iron deficiency anemia, unspecified: Secondary | ICD-10-CM | POA: Diagnosis not present

## 2018-06-20 DIAGNOSIS — I5032 Chronic diastolic (congestive) heart failure: Secondary | ICD-10-CM | POA: Diagnosis not present

## 2018-06-20 DIAGNOSIS — I700 Atherosclerosis of aorta: Secondary | ICD-10-CM | POA: Diagnosis not present

## 2018-06-20 DIAGNOSIS — I130 Hypertensive heart and chronic kidney disease with heart failure and stage 1 through stage 4 chronic kidney disease, or unspecified chronic kidney disease: Secondary | ICD-10-CM | POA: Diagnosis not present

## 2018-06-20 DIAGNOSIS — E785 Hyperlipidemia, unspecified: Secondary | ICD-10-CM | POA: Diagnosis not present

## 2018-06-20 DIAGNOSIS — N183 Chronic kidney disease, stage 3 (moderate): Secondary | ICD-10-CM | POA: Diagnosis not present

## 2018-06-20 DIAGNOSIS — Z7901 Long term (current) use of anticoagulants: Secondary | ICD-10-CM | POA: Diagnosis not present

## 2018-06-20 DIAGNOSIS — Z791 Long term (current) use of non-steroidal anti-inflammatories (NSAID): Secondary | ICD-10-CM | POA: Diagnosis not present

## 2018-06-20 DIAGNOSIS — I4510 Unspecified right bundle-branch block: Secondary | ICD-10-CM | POA: Diagnosis not present

## 2018-06-20 DIAGNOSIS — I4891 Unspecified atrial fibrillation: Secondary | ICD-10-CM | POA: Diagnosis not present

## 2018-06-21 DIAGNOSIS — I5032 Chronic diastolic (congestive) heart failure: Secondary | ICD-10-CM | POA: Diagnosis not present

## 2018-06-21 DIAGNOSIS — H548 Legal blindness, as defined in USA: Secondary | ICD-10-CM | POA: Diagnosis not present

## 2018-06-21 DIAGNOSIS — Z791 Long term (current) use of non-steroidal anti-inflammatories (NSAID): Secondary | ICD-10-CM | POA: Diagnosis not present

## 2018-06-21 DIAGNOSIS — I4510 Unspecified right bundle-branch block: Secondary | ICD-10-CM | POA: Diagnosis not present

## 2018-06-21 DIAGNOSIS — D509 Iron deficiency anemia, unspecified: Secondary | ICD-10-CM | POA: Diagnosis not present

## 2018-06-21 DIAGNOSIS — E785 Hyperlipidemia, unspecified: Secondary | ICD-10-CM | POA: Diagnosis not present

## 2018-06-21 DIAGNOSIS — H3530 Unspecified macular degeneration: Secondary | ICD-10-CM | POA: Diagnosis not present

## 2018-06-21 DIAGNOSIS — Z7901 Long term (current) use of anticoagulants: Secondary | ICD-10-CM | POA: Diagnosis not present

## 2018-06-21 DIAGNOSIS — J449 Chronic obstructive pulmonary disease, unspecified: Secondary | ICD-10-CM | POA: Diagnosis not present

## 2018-06-21 DIAGNOSIS — I700 Atherosclerosis of aorta: Secondary | ICD-10-CM | POA: Diagnosis not present

## 2018-06-21 DIAGNOSIS — I130 Hypertensive heart and chronic kidney disease with heart failure and stage 1 through stage 4 chronic kidney disease, or unspecified chronic kidney disease: Secondary | ICD-10-CM | POA: Diagnosis not present

## 2018-06-21 DIAGNOSIS — I4891 Unspecified atrial fibrillation: Secondary | ICD-10-CM | POA: Diagnosis not present

## 2018-06-21 DIAGNOSIS — N183 Chronic kidney disease, stage 3 (moderate): Secondary | ICD-10-CM | POA: Diagnosis not present

## 2018-06-22 DIAGNOSIS — H548 Legal blindness, as defined in USA: Secondary | ICD-10-CM | POA: Diagnosis not present

## 2018-06-22 DIAGNOSIS — I4510 Unspecified right bundle-branch block: Secondary | ICD-10-CM | POA: Diagnosis not present

## 2018-06-22 DIAGNOSIS — J449 Chronic obstructive pulmonary disease, unspecified: Secondary | ICD-10-CM | POA: Diagnosis not present

## 2018-06-22 DIAGNOSIS — E785 Hyperlipidemia, unspecified: Secondary | ICD-10-CM | POA: Diagnosis not present

## 2018-06-22 DIAGNOSIS — I5032 Chronic diastolic (congestive) heart failure: Secondary | ICD-10-CM | POA: Diagnosis not present

## 2018-06-22 DIAGNOSIS — I130 Hypertensive heart and chronic kidney disease with heart failure and stage 1 through stage 4 chronic kidney disease, or unspecified chronic kidney disease: Secondary | ICD-10-CM | POA: Diagnosis not present

## 2018-06-22 DIAGNOSIS — Z7901 Long term (current) use of anticoagulants: Secondary | ICD-10-CM | POA: Diagnosis not present

## 2018-06-22 DIAGNOSIS — I4891 Unspecified atrial fibrillation: Secondary | ICD-10-CM | POA: Diagnosis not present

## 2018-06-22 DIAGNOSIS — N183 Chronic kidney disease, stage 3 (moderate): Secondary | ICD-10-CM | POA: Diagnosis not present

## 2018-06-22 DIAGNOSIS — D509 Iron deficiency anemia, unspecified: Secondary | ICD-10-CM | POA: Diagnosis not present

## 2018-06-22 DIAGNOSIS — Z791 Long term (current) use of non-steroidal anti-inflammatories (NSAID): Secondary | ICD-10-CM | POA: Diagnosis not present

## 2018-06-22 DIAGNOSIS — I700 Atherosclerosis of aorta: Secondary | ICD-10-CM | POA: Diagnosis not present

## 2018-06-22 DIAGNOSIS — H3530 Unspecified macular degeneration: Secondary | ICD-10-CM | POA: Diagnosis not present

## 2018-06-28 DIAGNOSIS — I4891 Unspecified atrial fibrillation: Secondary | ICD-10-CM | POA: Diagnosis not present

## 2018-06-28 DIAGNOSIS — I700 Atherosclerosis of aorta: Secondary | ICD-10-CM | POA: Diagnosis not present

## 2018-06-28 DIAGNOSIS — E785 Hyperlipidemia, unspecified: Secondary | ICD-10-CM | POA: Diagnosis not present

## 2018-06-28 DIAGNOSIS — N183 Chronic kidney disease, stage 3 (moderate): Secondary | ICD-10-CM | POA: Diagnosis not present

## 2018-06-28 DIAGNOSIS — Z791 Long term (current) use of non-steroidal anti-inflammatories (NSAID): Secondary | ICD-10-CM | POA: Diagnosis not present

## 2018-06-28 DIAGNOSIS — I130 Hypertensive heart and chronic kidney disease with heart failure and stage 1 through stage 4 chronic kidney disease, or unspecified chronic kidney disease: Secondary | ICD-10-CM | POA: Diagnosis not present

## 2018-06-28 DIAGNOSIS — I4510 Unspecified right bundle-branch block: Secondary | ICD-10-CM | POA: Diagnosis not present

## 2018-06-28 DIAGNOSIS — I5032 Chronic diastolic (congestive) heart failure: Secondary | ICD-10-CM | POA: Diagnosis not present

## 2018-06-28 DIAGNOSIS — J449 Chronic obstructive pulmonary disease, unspecified: Secondary | ICD-10-CM | POA: Diagnosis not present

## 2018-06-28 DIAGNOSIS — Z7901 Long term (current) use of anticoagulants: Secondary | ICD-10-CM | POA: Diagnosis not present

## 2018-06-28 DIAGNOSIS — D509 Iron deficiency anemia, unspecified: Secondary | ICD-10-CM | POA: Diagnosis not present

## 2018-06-28 DIAGNOSIS — H3530 Unspecified macular degeneration: Secondary | ICD-10-CM | POA: Diagnosis not present

## 2018-06-28 DIAGNOSIS — H548 Legal blindness, as defined in USA: Secondary | ICD-10-CM | POA: Diagnosis not present

## 2018-07-01 DIAGNOSIS — Z791 Long term (current) use of non-steroidal anti-inflammatories (NSAID): Secondary | ICD-10-CM | POA: Diagnosis not present

## 2018-07-01 DIAGNOSIS — E785 Hyperlipidemia, unspecified: Secondary | ICD-10-CM | POA: Diagnosis not present

## 2018-07-01 DIAGNOSIS — Z7901 Long term (current) use of anticoagulants: Secondary | ICD-10-CM | POA: Diagnosis not present

## 2018-07-01 DIAGNOSIS — I700 Atherosclerosis of aorta: Secondary | ICD-10-CM | POA: Diagnosis not present

## 2018-07-01 DIAGNOSIS — I130 Hypertensive heart and chronic kidney disease with heart failure and stage 1 through stage 4 chronic kidney disease, or unspecified chronic kidney disease: Secondary | ICD-10-CM | POA: Diagnosis not present

## 2018-07-01 DIAGNOSIS — I5032 Chronic diastolic (congestive) heart failure: Secondary | ICD-10-CM | POA: Diagnosis not present

## 2018-07-01 DIAGNOSIS — H3530 Unspecified macular degeneration: Secondary | ICD-10-CM | POA: Diagnosis not present

## 2018-07-01 DIAGNOSIS — I4510 Unspecified right bundle-branch block: Secondary | ICD-10-CM | POA: Diagnosis not present

## 2018-07-01 DIAGNOSIS — N183 Chronic kidney disease, stage 3 (moderate): Secondary | ICD-10-CM | POA: Diagnosis not present

## 2018-07-01 DIAGNOSIS — D509 Iron deficiency anemia, unspecified: Secondary | ICD-10-CM | POA: Diagnosis not present

## 2018-07-01 DIAGNOSIS — J449 Chronic obstructive pulmonary disease, unspecified: Secondary | ICD-10-CM | POA: Diagnosis not present

## 2018-07-01 DIAGNOSIS — H548 Legal blindness, as defined in USA: Secondary | ICD-10-CM | POA: Diagnosis not present

## 2018-07-01 DIAGNOSIS — I4891 Unspecified atrial fibrillation: Secondary | ICD-10-CM | POA: Diagnosis not present

## 2018-07-04 DIAGNOSIS — Z7901 Long term (current) use of anticoagulants: Secondary | ICD-10-CM | POA: Diagnosis not present

## 2018-07-04 DIAGNOSIS — I700 Atherosclerosis of aorta: Secondary | ICD-10-CM | POA: Diagnosis not present

## 2018-07-04 DIAGNOSIS — J449 Chronic obstructive pulmonary disease, unspecified: Secondary | ICD-10-CM | POA: Diagnosis not present

## 2018-07-04 DIAGNOSIS — I4510 Unspecified right bundle-branch block: Secondary | ICD-10-CM | POA: Diagnosis not present

## 2018-07-04 DIAGNOSIS — I5032 Chronic diastolic (congestive) heart failure: Secondary | ICD-10-CM | POA: Diagnosis not present

## 2018-07-04 DIAGNOSIS — H548 Legal blindness, as defined in USA: Secondary | ICD-10-CM | POA: Diagnosis not present

## 2018-07-04 DIAGNOSIS — H3530 Unspecified macular degeneration: Secondary | ICD-10-CM | POA: Diagnosis not present

## 2018-07-04 DIAGNOSIS — E785 Hyperlipidemia, unspecified: Secondary | ICD-10-CM | POA: Diagnosis not present

## 2018-07-04 DIAGNOSIS — N183 Chronic kidney disease, stage 3 (moderate): Secondary | ICD-10-CM | POA: Diagnosis not present

## 2018-07-04 DIAGNOSIS — D509 Iron deficiency anemia, unspecified: Secondary | ICD-10-CM | POA: Diagnosis not present

## 2018-07-04 DIAGNOSIS — I4891 Unspecified atrial fibrillation: Secondary | ICD-10-CM | POA: Diagnosis not present

## 2018-07-04 DIAGNOSIS — I130 Hypertensive heart and chronic kidney disease with heart failure and stage 1 through stage 4 chronic kidney disease, or unspecified chronic kidney disease: Secondary | ICD-10-CM | POA: Diagnosis not present

## 2018-07-04 DIAGNOSIS — Z791 Long term (current) use of non-steroidal anti-inflammatories (NSAID): Secondary | ICD-10-CM | POA: Diagnosis not present

## 2018-07-06 DIAGNOSIS — H3530 Unspecified macular degeneration: Secondary | ICD-10-CM | POA: Diagnosis not present

## 2018-07-06 DIAGNOSIS — H548 Legal blindness, as defined in USA: Secondary | ICD-10-CM | POA: Diagnosis not present

## 2018-07-06 DIAGNOSIS — I700 Atherosclerosis of aorta: Secondary | ICD-10-CM | POA: Diagnosis not present

## 2018-07-06 DIAGNOSIS — J449 Chronic obstructive pulmonary disease, unspecified: Secondary | ICD-10-CM | POA: Diagnosis not present

## 2018-07-06 DIAGNOSIS — N183 Chronic kidney disease, stage 3 (moderate): Secondary | ICD-10-CM | POA: Diagnosis not present

## 2018-07-06 DIAGNOSIS — Z7901 Long term (current) use of anticoagulants: Secondary | ICD-10-CM | POA: Diagnosis not present

## 2018-07-06 DIAGNOSIS — I4891 Unspecified atrial fibrillation: Secondary | ICD-10-CM | POA: Diagnosis not present

## 2018-07-06 DIAGNOSIS — Z791 Long term (current) use of non-steroidal anti-inflammatories (NSAID): Secondary | ICD-10-CM | POA: Diagnosis not present

## 2018-07-06 DIAGNOSIS — I5032 Chronic diastolic (congestive) heart failure: Secondary | ICD-10-CM | POA: Diagnosis not present

## 2018-07-06 DIAGNOSIS — I4510 Unspecified right bundle-branch block: Secondary | ICD-10-CM | POA: Diagnosis not present

## 2018-07-06 DIAGNOSIS — E785 Hyperlipidemia, unspecified: Secondary | ICD-10-CM | POA: Diagnosis not present

## 2018-07-06 DIAGNOSIS — I130 Hypertensive heart and chronic kidney disease with heart failure and stage 1 through stage 4 chronic kidney disease, or unspecified chronic kidney disease: Secondary | ICD-10-CM | POA: Diagnosis not present

## 2018-07-06 DIAGNOSIS — D509 Iron deficiency anemia, unspecified: Secondary | ICD-10-CM | POA: Diagnosis not present

## 2018-11-26 DIAGNOSIS — L97909 Non-pressure chronic ulcer of unspecified part of unspecified lower leg with unspecified severity: Secondary | ICD-10-CM | POA: Diagnosis not present

## 2018-11-26 DIAGNOSIS — I872 Venous insufficiency (chronic) (peripheral): Secondary | ICD-10-CM | POA: Diagnosis not present

## 2018-12-02 DIAGNOSIS — L03115 Cellulitis of right lower limb: Secondary | ICD-10-CM | POA: Diagnosis not present

## 2019-01-23 DIAGNOSIS — I5043 Acute on chronic combined systolic (congestive) and diastolic (congestive) heart failure: Secondary | ICD-10-CM | POA: Diagnosis not present

## 2019-02-09 DIAGNOSIS — L0390 Cellulitis, unspecified: Secondary | ICD-10-CM | POA: Diagnosis not present

## 2019-02-10 DIAGNOSIS — S81801A Unspecified open wound, right lower leg, initial encounter: Secondary | ICD-10-CM | POA: Diagnosis not present

## 2019-02-12 DIAGNOSIS — Z7901 Long term (current) use of anticoagulants: Secondary | ICD-10-CM | POA: Diagnosis not present

## 2019-02-12 DIAGNOSIS — H3530 Unspecified macular degeneration: Secondary | ICD-10-CM | POA: Diagnosis not present

## 2019-02-12 DIAGNOSIS — J449 Chronic obstructive pulmonary disease, unspecified: Secondary | ICD-10-CM | POA: Diagnosis not present

## 2019-02-12 DIAGNOSIS — I130 Hypertensive heart and chronic kidney disease with heart failure and stage 1 through stage 4 chronic kidney disease, or unspecified chronic kidney disease: Secondary | ICD-10-CM | POA: Diagnosis not present

## 2019-02-12 DIAGNOSIS — S81801D Unspecified open wound, right lower leg, subsequent encounter: Secondary | ICD-10-CM | POA: Diagnosis not present

## 2019-02-12 DIAGNOSIS — I4891 Unspecified atrial fibrillation: Secondary | ICD-10-CM | POA: Diagnosis not present

## 2019-02-12 DIAGNOSIS — D509 Iron deficiency anemia, unspecified: Secondary | ICD-10-CM | POA: Diagnosis not present

## 2019-02-12 DIAGNOSIS — I5032 Chronic diastolic (congestive) heart failure: Secondary | ICD-10-CM | POA: Diagnosis not present

## 2019-02-12 DIAGNOSIS — I4510 Unspecified right bundle-branch block: Secondary | ICD-10-CM | POA: Diagnosis not present

## 2019-02-12 DIAGNOSIS — I700 Atherosclerosis of aorta: Secondary | ICD-10-CM | POA: Diagnosis not present

## 2019-02-12 DIAGNOSIS — E785 Hyperlipidemia, unspecified: Secondary | ICD-10-CM | POA: Diagnosis not present

## 2019-02-12 DIAGNOSIS — L03115 Cellulitis of right lower limb: Secondary | ICD-10-CM | POA: Diagnosis not present

## 2019-02-12 DIAGNOSIS — Z791 Long term (current) use of non-steroidal anti-inflammatories (NSAID): Secondary | ICD-10-CM | POA: Diagnosis not present

## 2019-02-12 DIAGNOSIS — H548 Legal blindness, as defined in USA: Secondary | ICD-10-CM | POA: Diagnosis not present

## 2019-02-12 DIAGNOSIS — N183 Chronic kidney disease, stage 3 (moderate): Secondary | ICD-10-CM | POA: Diagnosis not present

## 2019-02-13 DIAGNOSIS — H3530 Unspecified macular degeneration: Secondary | ICD-10-CM | POA: Diagnosis not present

## 2019-02-13 DIAGNOSIS — I700 Atherosclerosis of aorta: Secondary | ICD-10-CM | POA: Diagnosis not present

## 2019-02-13 DIAGNOSIS — I4510 Unspecified right bundle-branch block: Secondary | ICD-10-CM | POA: Diagnosis not present

## 2019-02-13 DIAGNOSIS — H548 Legal blindness, as defined in USA: Secondary | ICD-10-CM | POA: Diagnosis not present

## 2019-02-13 DIAGNOSIS — L03115 Cellulitis of right lower limb: Secondary | ICD-10-CM | POA: Diagnosis not present

## 2019-02-13 DIAGNOSIS — E785 Hyperlipidemia, unspecified: Secondary | ICD-10-CM | POA: Diagnosis not present

## 2019-02-13 DIAGNOSIS — Z791 Long term (current) use of non-steroidal anti-inflammatories (NSAID): Secondary | ICD-10-CM | POA: Diagnosis not present

## 2019-02-13 DIAGNOSIS — S81801D Unspecified open wound, right lower leg, subsequent encounter: Secondary | ICD-10-CM | POA: Diagnosis not present

## 2019-02-13 DIAGNOSIS — Z7901 Long term (current) use of anticoagulants: Secondary | ICD-10-CM | POA: Diagnosis not present

## 2019-02-13 DIAGNOSIS — I5032 Chronic diastolic (congestive) heart failure: Secondary | ICD-10-CM | POA: Diagnosis not present

## 2019-02-13 DIAGNOSIS — I130 Hypertensive heart and chronic kidney disease with heart failure and stage 1 through stage 4 chronic kidney disease, or unspecified chronic kidney disease: Secondary | ICD-10-CM | POA: Diagnosis not present

## 2019-02-13 DIAGNOSIS — I4891 Unspecified atrial fibrillation: Secondary | ICD-10-CM | POA: Diagnosis not present

## 2019-02-13 DIAGNOSIS — D509 Iron deficiency anemia, unspecified: Secondary | ICD-10-CM | POA: Diagnosis not present

## 2019-02-13 DIAGNOSIS — J449 Chronic obstructive pulmonary disease, unspecified: Secondary | ICD-10-CM | POA: Diagnosis not present

## 2019-02-13 DIAGNOSIS — N183 Chronic kidney disease, stage 3 (moderate): Secondary | ICD-10-CM | POA: Diagnosis not present

## 2019-02-14 DIAGNOSIS — D509 Iron deficiency anemia, unspecified: Secondary | ICD-10-CM | POA: Diagnosis not present

## 2019-02-14 DIAGNOSIS — I4891 Unspecified atrial fibrillation: Secondary | ICD-10-CM | POA: Diagnosis not present

## 2019-02-14 DIAGNOSIS — I130 Hypertensive heart and chronic kidney disease with heart failure and stage 1 through stage 4 chronic kidney disease, or unspecified chronic kidney disease: Secondary | ICD-10-CM | POA: Diagnosis not present

## 2019-02-14 DIAGNOSIS — Z791 Long term (current) use of non-steroidal anti-inflammatories (NSAID): Secondary | ICD-10-CM | POA: Diagnosis not present

## 2019-02-14 DIAGNOSIS — L03115 Cellulitis of right lower limb: Secondary | ICD-10-CM | POA: Diagnosis not present

## 2019-02-14 DIAGNOSIS — S81801D Unspecified open wound, right lower leg, subsequent encounter: Secondary | ICD-10-CM | POA: Diagnosis not present

## 2019-02-14 DIAGNOSIS — Z7901 Long term (current) use of anticoagulants: Secondary | ICD-10-CM | POA: Diagnosis not present

## 2019-02-14 DIAGNOSIS — I4510 Unspecified right bundle-branch block: Secondary | ICD-10-CM | POA: Diagnosis not present

## 2019-02-14 DIAGNOSIS — J449 Chronic obstructive pulmonary disease, unspecified: Secondary | ICD-10-CM | POA: Diagnosis not present

## 2019-02-14 DIAGNOSIS — N183 Chronic kidney disease, stage 3 (moderate): Secondary | ICD-10-CM | POA: Diagnosis not present

## 2019-02-14 DIAGNOSIS — E785 Hyperlipidemia, unspecified: Secondary | ICD-10-CM | POA: Diagnosis not present

## 2019-02-14 DIAGNOSIS — H3530 Unspecified macular degeneration: Secondary | ICD-10-CM | POA: Diagnosis not present

## 2019-02-14 DIAGNOSIS — I700 Atherosclerosis of aorta: Secondary | ICD-10-CM | POA: Diagnosis not present

## 2019-02-14 DIAGNOSIS — I5032 Chronic diastolic (congestive) heart failure: Secondary | ICD-10-CM | POA: Diagnosis not present

## 2019-02-14 DIAGNOSIS — H548 Legal blindness, as defined in USA: Secondary | ICD-10-CM | POA: Diagnosis not present

## 2019-02-15 DIAGNOSIS — E785 Hyperlipidemia, unspecified: Secondary | ICD-10-CM | POA: Diagnosis not present

## 2019-02-15 DIAGNOSIS — I4891 Unspecified atrial fibrillation: Secondary | ICD-10-CM | POA: Diagnosis not present

## 2019-02-15 DIAGNOSIS — S81801D Unspecified open wound, right lower leg, subsequent encounter: Secondary | ICD-10-CM | POA: Diagnosis not present

## 2019-02-15 DIAGNOSIS — D509 Iron deficiency anemia, unspecified: Secondary | ICD-10-CM | POA: Diagnosis not present

## 2019-02-15 DIAGNOSIS — Z7901 Long term (current) use of anticoagulants: Secondary | ICD-10-CM | POA: Diagnosis not present

## 2019-02-15 DIAGNOSIS — I5032 Chronic diastolic (congestive) heart failure: Secondary | ICD-10-CM | POA: Diagnosis not present

## 2019-02-15 DIAGNOSIS — H3530 Unspecified macular degeneration: Secondary | ICD-10-CM | POA: Diagnosis not present

## 2019-02-15 DIAGNOSIS — L03115 Cellulitis of right lower limb: Secondary | ICD-10-CM | POA: Diagnosis not present

## 2019-02-15 DIAGNOSIS — N183 Chronic kidney disease, stage 3 (moderate): Secondary | ICD-10-CM | POA: Diagnosis not present

## 2019-02-15 DIAGNOSIS — I700 Atherosclerosis of aorta: Secondary | ICD-10-CM | POA: Diagnosis not present

## 2019-02-15 DIAGNOSIS — I4510 Unspecified right bundle-branch block: Secondary | ICD-10-CM | POA: Diagnosis not present

## 2019-02-15 DIAGNOSIS — J449 Chronic obstructive pulmonary disease, unspecified: Secondary | ICD-10-CM | POA: Diagnosis not present

## 2019-02-15 DIAGNOSIS — I130 Hypertensive heart and chronic kidney disease with heart failure and stage 1 through stage 4 chronic kidney disease, or unspecified chronic kidney disease: Secondary | ICD-10-CM | POA: Diagnosis not present

## 2019-02-15 DIAGNOSIS — H548 Legal blindness, as defined in USA: Secondary | ICD-10-CM | POA: Diagnosis not present

## 2019-02-15 DIAGNOSIS — Z791 Long term (current) use of non-steroidal anti-inflammatories (NSAID): Secondary | ICD-10-CM | POA: Diagnosis not present

## 2019-02-16 DIAGNOSIS — H548 Legal blindness, as defined in USA: Secondary | ICD-10-CM | POA: Diagnosis not present

## 2019-02-16 DIAGNOSIS — I130 Hypertensive heart and chronic kidney disease with heart failure and stage 1 through stage 4 chronic kidney disease, or unspecified chronic kidney disease: Secondary | ICD-10-CM | POA: Diagnosis not present

## 2019-02-16 DIAGNOSIS — I4891 Unspecified atrial fibrillation: Secondary | ICD-10-CM | POA: Diagnosis not present

## 2019-02-16 DIAGNOSIS — Z7901 Long term (current) use of anticoagulants: Secondary | ICD-10-CM | POA: Diagnosis not present

## 2019-02-16 DIAGNOSIS — E785 Hyperlipidemia, unspecified: Secondary | ICD-10-CM | POA: Diagnosis not present

## 2019-02-16 DIAGNOSIS — S81801D Unspecified open wound, right lower leg, subsequent encounter: Secondary | ICD-10-CM | POA: Diagnosis not present

## 2019-02-16 DIAGNOSIS — D509 Iron deficiency anemia, unspecified: Secondary | ICD-10-CM | POA: Diagnosis not present

## 2019-02-16 DIAGNOSIS — I5032 Chronic diastolic (congestive) heart failure: Secondary | ICD-10-CM | POA: Diagnosis not present

## 2019-02-16 DIAGNOSIS — Z791 Long term (current) use of non-steroidal anti-inflammatories (NSAID): Secondary | ICD-10-CM | POA: Diagnosis not present

## 2019-02-16 DIAGNOSIS — N183 Chronic kidney disease, stage 3 (moderate): Secondary | ICD-10-CM | POA: Diagnosis not present

## 2019-02-16 DIAGNOSIS — H3530 Unspecified macular degeneration: Secondary | ICD-10-CM | POA: Diagnosis not present

## 2019-02-16 DIAGNOSIS — I4510 Unspecified right bundle-branch block: Secondary | ICD-10-CM | POA: Diagnosis not present

## 2019-02-16 DIAGNOSIS — L03115 Cellulitis of right lower limb: Secondary | ICD-10-CM | POA: Diagnosis not present

## 2019-02-16 DIAGNOSIS — I700 Atherosclerosis of aorta: Secondary | ICD-10-CM | POA: Diagnosis not present

## 2019-02-16 DIAGNOSIS — J449 Chronic obstructive pulmonary disease, unspecified: Secondary | ICD-10-CM | POA: Diagnosis not present

## 2019-02-17 DIAGNOSIS — J449 Chronic obstructive pulmonary disease, unspecified: Secondary | ICD-10-CM | POA: Diagnosis not present

## 2019-02-17 DIAGNOSIS — E785 Hyperlipidemia, unspecified: Secondary | ICD-10-CM | POA: Diagnosis not present

## 2019-02-17 DIAGNOSIS — I4891 Unspecified atrial fibrillation: Secondary | ICD-10-CM | POA: Diagnosis not present

## 2019-02-17 DIAGNOSIS — I5032 Chronic diastolic (congestive) heart failure: Secondary | ICD-10-CM | POA: Diagnosis not present

## 2019-02-17 DIAGNOSIS — I130 Hypertensive heart and chronic kidney disease with heart failure and stage 1 through stage 4 chronic kidney disease, or unspecified chronic kidney disease: Secondary | ICD-10-CM | POA: Diagnosis not present

## 2019-02-17 DIAGNOSIS — H3530 Unspecified macular degeneration: Secondary | ICD-10-CM | POA: Diagnosis not present

## 2019-02-17 DIAGNOSIS — N183 Chronic kidney disease, stage 3 (moderate): Secondary | ICD-10-CM | POA: Diagnosis not present

## 2019-02-17 DIAGNOSIS — L03115 Cellulitis of right lower limb: Secondary | ICD-10-CM | POA: Diagnosis not present

## 2019-02-17 DIAGNOSIS — Z791 Long term (current) use of non-steroidal anti-inflammatories (NSAID): Secondary | ICD-10-CM | POA: Diagnosis not present

## 2019-02-17 DIAGNOSIS — D509 Iron deficiency anemia, unspecified: Secondary | ICD-10-CM | POA: Diagnosis not present

## 2019-02-17 DIAGNOSIS — S81801D Unspecified open wound, right lower leg, subsequent encounter: Secondary | ICD-10-CM | POA: Diagnosis not present

## 2019-02-17 DIAGNOSIS — I4510 Unspecified right bundle-branch block: Secondary | ICD-10-CM | POA: Diagnosis not present

## 2019-02-17 DIAGNOSIS — Z7901 Long term (current) use of anticoagulants: Secondary | ICD-10-CM | POA: Diagnosis not present

## 2019-02-17 DIAGNOSIS — H548 Legal blindness, as defined in USA: Secondary | ICD-10-CM | POA: Diagnosis not present

## 2019-02-17 DIAGNOSIS — I700 Atherosclerosis of aorta: Secondary | ICD-10-CM | POA: Diagnosis not present

## 2019-02-18 DIAGNOSIS — I130 Hypertensive heart and chronic kidney disease with heart failure and stage 1 through stage 4 chronic kidney disease, or unspecified chronic kidney disease: Secondary | ICD-10-CM | POA: Diagnosis not present

## 2019-02-18 DIAGNOSIS — I4891 Unspecified atrial fibrillation: Secondary | ICD-10-CM | POA: Diagnosis not present

## 2019-02-18 DIAGNOSIS — Z7901 Long term (current) use of anticoagulants: Secondary | ICD-10-CM | POA: Diagnosis not present

## 2019-02-18 DIAGNOSIS — S81801D Unspecified open wound, right lower leg, subsequent encounter: Secondary | ICD-10-CM | POA: Diagnosis not present

## 2019-02-18 DIAGNOSIS — E785 Hyperlipidemia, unspecified: Secondary | ICD-10-CM | POA: Diagnosis not present

## 2019-02-18 DIAGNOSIS — H3530 Unspecified macular degeneration: Secondary | ICD-10-CM | POA: Diagnosis not present

## 2019-02-18 DIAGNOSIS — N183 Chronic kidney disease, stage 3 (moderate): Secondary | ICD-10-CM | POA: Diagnosis not present

## 2019-02-18 DIAGNOSIS — I4510 Unspecified right bundle-branch block: Secondary | ICD-10-CM | POA: Diagnosis not present

## 2019-02-18 DIAGNOSIS — I5032 Chronic diastolic (congestive) heart failure: Secondary | ICD-10-CM | POA: Diagnosis not present

## 2019-02-18 DIAGNOSIS — L03115 Cellulitis of right lower limb: Secondary | ICD-10-CM | POA: Diagnosis not present

## 2019-02-18 DIAGNOSIS — Z791 Long term (current) use of non-steroidal anti-inflammatories (NSAID): Secondary | ICD-10-CM | POA: Diagnosis not present

## 2019-02-18 DIAGNOSIS — J449 Chronic obstructive pulmonary disease, unspecified: Secondary | ICD-10-CM | POA: Diagnosis not present

## 2019-02-18 DIAGNOSIS — D509 Iron deficiency anemia, unspecified: Secondary | ICD-10-CM | POA: Diagnosis not present

## 2019-02-18 DIAGNOSIS — H548 Legal blindness, as defined in USA: Secondary | ICD-10-CM | POA: Diagnosis not present

## 2019-02-18 DIAGNOSIS — I700 Atherosclerosis of aorta: Secondary | ICD-10-CM | POA: Diagnosis not present

## 2019-02-19 DIAGNOSIS — J449 Chronic obstructive pulmonary disease, unspecified: Secondary | ICD-10-CM | POA: Diagnosis not present

## 2019-02-19 DIAGNOSIS — H548 Legal blindness, as defined in USA: Secondary | ICD-10-CM | POA: Diagnosis not present

## 2019-02-19 DIAGNOSIS — I700 Atherosclerosis of aorta: Secondary | ICD-10-CM | POA: Diagnosis not present

## 2019-02-19 DIAGNOSIS — Z7901 Long term (current) use of anticoagulants: Secondary | ICD-10-CM | POA: Diagnosis not present

## 2019-02-19 DIAGNOSIS — Z791 Long term (current) use of non-steroidal anti-inflammatories (NSAID): Secondary | ICD-10-CM | POA: Diagnosis not present

## 2019-02-19 DIAGNOSIS — I130 Hypertensive heart and chronic kidney disease with heart failure and stage 1 through stage 4 chronic kidney disease, or unspecified chronic kidney disease: Secondary | ICD-10-CM | POA: Diagnosis not present

## 2019-02-19 DIAGNOSIS — N183 Chronic kidney disease, stage 3 (moderate): Secondary | ICD-10-CM | POA: Diagnosis not present

## 2019-02-19 DIAGNOSIS — I5032 Chronic diastolic (congestive) heart failure: Secondary | ICD-10-CM | POA: Diagnosis not present

## 2019-02-19 DIAGNOSIS — I4510 Unspecified right bundle-branch block: Secondary | ICD-10-CM | POA: Diagnosis not present

## 2019-02-19 DIAGNOSIS — H3530 Unspecified macular degeneration: Secondary | ICD-10-CM | POA: Diagnosis not present

## 2019-02-19 DIAGNOSIS — E785 Hyperlipidemia, unspecified: Secondary | ICD-10-CM | POA: Diagnosis not present

## 2019-02-19 DIAGNOSIS — S81801D Unspecified open wound, right lower leg, subsequent encounter: Secondary | ICD-10-CM | POA: Diagnosis not present

## 2019-02-19 DIAGNOSIS — L03115 Cellulitis of right lower limb: Secondary | ICD-10-CM | POA: Diagnosis not present

## 2019-02-19 DIAGNOSIS — I4891 Unspecified atrial fibrillation: Secondary | ICD-10-CM | POA: Diagnosis not present

## 2019-02-19 DIAGNOSIS — D509 Iron deficiency anemia, unspecified: Secondary | ICD-10-CM | POA: Diagnosis not present

## 2019-02-20 DIAGNOSIS — Z791 Long term (current) use of non-steroidal anti-inflammatories (NSAID): Secondary | ICD-10-CM | POA: Diagnosis not present

## 2019-02-20 DIAGNOSIS — I5032 Chronic diastolic (congestive) heart failure: Secondary | ICD-10-CM | POA: Diagnosis not present

## 2019-02-20 DIAGNOSIS — L03115 Cellulitis of right lower limb: Secondary | ICD-10-CM | POA: Diagnosis not present

## 2019-02-20 DIAGNOSIS — I130 Hypertensive heart and chronic kidney disease with heart failure and stage 1 through stage 4 chronic kidney disease, or unspecified chronic kidney disease: Secondary | ICD-10-CM | POA: Diagnosis not present

## 2019-02-20 DIAGNOSIS — H548 Legal blindness, as defined in USA: Secondary | ICD-10-CM | POA: Diagnosis not present

## 2019-02-20 DIAGNOSIS — D509 Iron deficiency anemia, unspecified: Secondary | ICD-10-CM | POA: Diagnosis not present

## 2019-02-20 DIAGNOSIS — N183 Chronic kidney disease, stage 3 (moderate): Secondary | ICD-10-CM | POA: Diagnosis not present

## 2019-02-20 DIAGNOSIS — S81801D Unspecified open wound, right lower leg, subsequent encounter: Secondary | ICD-10-CM | POA: Diagnosis not present

## 2019-02-20 DIAGNOSIS — I700 Atherosclerosis of aorta: Secondary | ICD-10-CM | POA: Diagnosis not present

## 2019-02-20 DIAGNOSIS — J449 Chronic obstructive pulmonary disease, unspecified: Secondary | ICD-10-CM | POA: Diagnosis not present

## 2019-02-20 DIAGNOSIS — Z7901 Long term (current) use of anticoagulants: Secondary | ICD-10-CM | POA: Diagnosis not present

## 2019-02-20 DIAGNOSIS — E785 Hyperlipidemia, unspecified: Secondary | ICD-10-CM | POA: Diagnosis not present

## 2019-02-20 DIAGNOSIS — I4510 Unspecified right bundle-branch block: Secondary | ICD-10-CM | POA: Diagnosis not present

## 2019-02-20 DIAGNOSIS — I4891 Unspecified atrial fibrillation: Secondary | ICD-10-CM | POA: Diagnosis not present

## 2019-02-20 DIAGNOSIS — H3530 Unspecified macular degeneration: Secondary | ICD-10-CM | POA: Diagnosis not present

## 2019-02-21 DIAGNOSIS — I5032 Chronic diastolic (congestive) heart failure: Secondary | ICD-10-CM | POA: Diagnosis not present

## 2019-02-21 DIAGNOSIS — H3530 Unspecified macular degeneration: Secondary | ICD-10-CM | POA: Diagnosis not present

## 2019-02-21 DIAGNOSIS — I4510 Unspecified right bundle-branch block: Secondary | ICD-10-CM | POA: Diagnosis not present

## 2019-02-21 DIAGNOSIS — S81801D Unspecified open wound, right lower leg, subsequent encounter: Secondary | ICD-10-CM | POA: Diagnosis not present

## 2019-02-21 DIAGNOSIS — L03115 Cellulitis of right lower limb: Secondary | ICD-10-CM | POA: Diagnosis not present

## 2019-02-21 DIAGNOSIS — I130 Hypertensive heart and chronic kidney disease with heart failure and stage 1 through stage 4 chronic kidney disease, or unspecified chronic kidney disease: Secondary | ICD-10-CM | POA: Diagnosis not present

## 2019-02-21 DIAGNOSIS — Z7901 Long term (current) use of anticoagulants: Secondary | ICD-10-CM | POA: Diagnosis not present

## 2019-02-21 DIAGNOSIS — I4891 Unspecified atrial fibrillation: Secondary | ICD-10-CM | POA: Diagnosis not present

## 2019-02-21 DIAGNOSIS — E785 Hyperlipidemia, unspecified: Secondary | ICD-10-CM | POA: Diagnosis not present

## 2019-02-21 DIAGNOSIS — J449 Chronic obstructive pulmonary disease, unspecified: Secondary | ICD-10-CM | POA: Diagnosis not present

## 2019-02-21 DIAGNOSIS — D509 Iron deficiency anemia, unspecified: Secondary | ICD-10-CM | POA: Diagnosis not present

## 2019-02-21 DIAGNOSIS — Z791 Long term (current) use of non-steroidal anti-inflammatories (NSAID): Secondary | ICD-10-CM | POA: Diagnosis not present

## 2019-02-21 DIAGNOSIS — H548 Legal blindness, as defined in USA: Secondary | ICD-10-CM | POA: Diagnosis not present

## 2019-02-21 DIAGNOSIS — I700 Atherosclerosis of aorta: Secondary | ICD-10-CM | POA: Diagnosis not present

## 2019-02-21 DIAGNOSIS — N183 Chronic kidney disease, stage 3 (moderate): Secondary | ICD-10-CM | POA: Diagnosis not present

## 2019-02-22 DIAGNOSIS — L03115 Cellulitis of right lower limb: Secondary | ICD-10-CM | POA: Diagnosis not present

## 2019-02-22 DIAGNOSIS — H3530 Unspecified macular degeneration: Secondary | ICD-10-CM | POA: Diagnosis not present

## 2019-02-22 DIAGNOSIS — I700 Atherosclerosis of aorta: Secondary | ICD-10-CM | POA: Diagnosis not present

## 2019-02-22 DIAGNOSIS — I4891 Unspecified atrial fibrillation: Secondary | ICD-10-CM | POA: Diagnosis not present

## 2019-02-22 DIAGNOSIS — I4510 Unspecified right bundle-branch block: Secondary | ICD-10-CM | POA: Diagnosis not present

## 2019-02-22 DIAGNOSIS — Z7901 Long term (current) use of anticoagulants: Secondary | ICD-10-CM | POA: Diagnosis not present

## 2019-02-22 DIAGNOSIS — N183 Chronic kidney disease, stage 3 (moderate): Secondary | ICD-10-CM | POA: Diagnosis not present

## 2019-02-22 DIAGNOSIS — I130 Hypertensive heart and chronic kidney disease with heart failure and stage 1 through stage 4 chronic kidney disease, or unspecified chronic kidney disease: Secondary | ICD-10-CM | POA: Diagnosis not present

## 2019-02-22 DIAGNOSIS — Z791 Long term (current) use of non-steroidal anti-inflammatories (NSAID): Secondary | ICD-10-CM | POA: Diagnosis not present

## 2019-02-22 DIAGNOSIS — J449 Chronic obstructive pulmonary disease, unspecified: Secondary | ICD-10-CM | POA: Diagnosis not present

## 2019-02-22 DIAGNOSIS — I5032 Chronic diastolic (congestive) heart failure: Secondary | ICD-10-CM | POA: Diagnosis not present

## 2019-02-22 DIAGNOSIS — H548 Legal blindness, as defined in USA: Secondary | ICD-10-CM | POA: Diagnosis not present

## 2019-02-22 DIAGNOSIS — E785 Hyperlipidemia, unspecified: Secondary | ICD-10-CM | POA: Diagnosis not present

## 2019-02-22 DIAGNOSIS — S81801D Unspecified open wound, right lower leg, subsequent encounter: Secondary | ICD-10-CM | POA: Diagnosis not present

## 2019-02-22 DIAGNOSIS — D509 Iron deficiency anemia, unspecified: Secondary | ICD-10-CM | POA: Diagnosis not present

## 2019-02-24 DIAGNOSIS — I4891 Unspecified atrial fibrillation: Secondary | ICD-10-CM | POA: Diagnosis not present

## 2019-02-24 DIAGNOSIS — Z791 Long term (current) use of non-steroidal anti-inflammatories (NSAID): Secondary | ICD-10-CM | POA: Diagnosis not present

## 2019-02-24 DIAGNOSIS — H3530 Unspecified macular degeneration: Secondary | ICD-10-CM | POA: Diagnosis not present

## 2019-02-24 DIAGNOSIS — Z7901 Long term (current) use of anticoagulants: Secondary | ICD-10-CM | POA: Diagnosis not present

## 2019-02-24 DIAGNOSIS — L03115 Cellulitis of right lower limb: Secondary | ICD-10-CM | POA: Diagnosis not present

## 2019-02-24 DIAGNOSIS — E785 Hyperlipidemia, unspecified: Secondary | ICD-10-CM | POA: Diagnosis not present

## 2019-02-24 DIAGNOSIS — H548 Legal blindness, as defined in USA: Secondary | ICD-10-CM | POA: Diagnosis not present

## 2019-02-24 DIAGNOSIS — D509 Iron deficiency anemia, unspecified: Secondary | ICD-10-CM | POA: Diagnosis not present

## 2019-02-24 DIAGNOSIS — N183 Chronic kidney disease, stage 3 (moderate): Secondary | ICD-10-CM | POA: Diagnosis not present

## 2019-02-24 DIAGNOSIS — I700 Atherosclerosis of aorta: Secondary | ICD-10-CM | POA: Diagnosis not present

## 2019-02-24 DIAGNOSIS — I130 Hypertensive heart and chronic kidney disease with heart failure and stage 1 through stage 4 chronic kidney disease, or unspecified chronic kidney disease: Secondary | ICD-10-CM | POA: Diagnosis not present

## 2019-02-24 DIAGNOSIS — I5032 Chronic diastolic (congestive) heart failure: Secondary | ICD-10-CM | POA: Diagnosis not present

## 2019-02-24 DIAGNOSIS — I4510 Unspecified right bundle-branch block: Secondary | ICD-10-CM | POA: Diagnosis not present

## 2019-02-24 DIAGNOSIS — J449 Chronic obstructive pulmonary disease, unspecified: Secondary | ICD-10-CM | POA: Diagnosis not present

## 2019-02-24 DIAGNOSIS — S81801D Unspecified open wound, right lower leg, subsequent encounter: Secondary | ICD-10-CM | POA: Diagnosis not present

## 2019-02-27 DIAGNOSIS — I4891 Unspecified atrial fibrillation: Secondary | ICD-10-CM | POA: Diagnosis not present

## 2019-02-27 DIAGNOSIS — Z791 Long term (current) use of non-steroidal anti-inflammatories (NSAID): Secondary | ICD-10-CM | POA: Diagnosis not present

## 2019-02-27 DIAGNOSIS — Z7901 Long term (current) use of anticoagulants: Secondary | ICD-10-CM | POA: Diagnosis not present

## 2019-02-27 DIAGNOSIS — J449 Chronic obstructive pulmonary disease, unspecified: Secondary | ICD-10-CM | POA: Diagnosis not present

## 2019-02-27 DIAGNOSIS — L03115 Cellulitis of right lower limb: Secondary | ICD-10-CM | POA: Diagnosis not present

## 2019-02-27 DIAGNOSIS — I5032 Chronic diastolic (congestive) heart failure: Secondary | ICD-10-CM | POA: Diagnosis not present

## 2019-02-27 DIAGNOSIS — I700 Atherosclerosis of aorta: Secondary | ICD-10-CM | POA: Diagnosis not present

## 2019-02-27 DIAGNOSIS — D509 Iron deficiency anemia, unspecified: Secondary | ICD-10-CM | POA: Diagnosis not present

## 2019-02-27 DIAGNOSIS — H548 Legal blindness, as defined in USA: Secondary | ICD-10-CM | POA: Diagnosis not present

## 2019-02-27 DIAGNOSIS — S81801D Unspecified open wound, right lower leg, subsequent encounter: Secondary | ICD-10-CM | POA: Diagnosis not present

## 2019-02-27 DIAGNOSIS — N183 Chronic kidney disease, stage 3 (moderate): Secondary | ICD-10-CM | POA: Diagnosis not present

## 2019-02-27 DIAGNOSIS — E785 Hyperlipidemia, unspecified: Secondary | ICD-10-CM | POA: Diagnosis not present

## 2019-02-27 DIAGNOSIS — I4510 Unspecified right bundle-branch block: Secondary | ICD-10-CM | POA: Diagnosis not present

## 2019-02-27 DIAGNOSIS — I130 Hypertensive heart and chronic kidney disease with heart failure and stage 1 through stage 4 chronic kidney disease, or unspecified chronic kidney disease: Secondary | ICD-10-CM | POA: Diagnosis not present

## 2019-02-27 DIAGNOSIS — H3530 Unspecified macular degeneration: Secondary | ICD-10-CM | POA: Diagnosis not present

## 2019-03-03 DIAGNOSIS — L03115 Cellulitis of right lower limb: Secondary | ICD-10-CM | POA: Diagnosis not present

## 2019-03-03 DIAGNOSIS — Z791 Long term (current) use of non-steroidal anti-inflammatories (NSAID): Secondary | ICD-10-CM | POA: Diagnosis not present

## 2019-03-03 DIAGNOSIS — H3530 Unspecified macular degeneration: Secondary | ICD-10-CM | POA: Diagnosis not present

## 2019-03-03 DIAGNOSIS — J449 Chronic obstructive pulmonary disease, unspecified: Secondary | ICD-10-CM | POA: Diagnosis not present

## 2019-03-03 DIAGNOSIS — I5032 Chronic diastolic (congestive) heart failure: Secondary | ICD-10-CM | POA: Diagnosis not present

## 2019-03-03 DIAGNOSIS — I4891 Unspecified atrial fibrillation: Secondary | ICD-10-CM | POA: Diagnosis not present

## 2019-03-03 DIAGNOSIS — S81801D Unspecified open wound, right lower leg, subsequent encounter: Secondary | ICD-10-CM | POA: Diagnosis not present

## 2019-03-03 DIAGNOSIS — H548 Legal blindness, as defined in USA: Secondary | ICD-10-CM | POA: Diagnosis not present

## 2019-03-03 DIAGNOSIS — I4510 Unspecified right bundle-branch block: Secondary | ICD-10-CM | POA: Diagnosis not present

## 2019-03-03 DIAGNOSIS — D509 Iron deficiency anemia, unspecified: Secondary | ICD-10-CM | POA: Diagnosis not present

## 2019-03-03 DIAGNOSIS — Z7901 Long term (current) use of anticoagulants: Secondary | ICD-10-CM | POA: Diagnosis not present

## 2019-03-03 DIAGNOSIS — I130 Hypertensive heart and chronic kidney disease with heart failure and stage 1 through stage 4 chronic kidney disease, or unspecified chronic kidney disease: Secondary | ICD-10-CM | POA: Diagnosis not present

## 2019-03-03 DIAGNOSIS — N183 Chronic kidney disease, stage 3 (moderate): Secondary | ICD-10-CM | POA: Diagnosis not present

## 2019-03-03 DIAGNOSIS — E785 Hyperlipidemia, unspecified: Secondary | ICD-10-CM | POA: Diagnosis not present

## 2019-03-03 DIAGNOSIS — I700 Atherosclerosis of aorta: Secondary | ICD-10-CM | POA: Diagnosis not present

## 2019-03-08 DIAGNOSIS — Z136 Encounter for screening for cardiovascular disorders: Secondary | ICD-10-CM | POA: Diagnosis not present

## 2019-03-08 DIAGNOSIS — Z0000 Encounter for general adult medical examination without abnormal findings: Secondary | ICD-10-CM | POA: Diagnosis not present

## 2019-03-08 DIAGNOSIS — Z9181 History of falling: Secondary | ICD-10-CM | POA: Diagnosis not present

## 2019-03-08 DIAGNOSIS — Z139 Encounter for screening, unspecified: Secondary | ICD-10-CM | POA: Diagnosis not present

## 2019-03-08 DIAGNOSIS — E785 Hyperlipidemia, unspecified: Secondary | ICD-10-CM | POA: Diagnosis not present

## 2019-03-31 ENCOUNTER — Other Ambulatory Visit: Payer: Self-pay

## 2019-03-31 ENCOUNTER — Ambulatory Visit: Payer: Medicare Other | Admitting: Sports Medicine

## 2019-03-31 ENCOUNTER — Encounter: Payer: Self-pay | Admitting: Sports Medicine

## 2019-03-31 VITALS — BP 130/88 | HR 80 | Temp 98.6°F | Resp 16 | Ht 70.0 in | Wt 196.0 lb

## 2019-03-31 DIAGNOSIS — B351 Tinea unguium: Secondary | ICD-10-CM | POA: Diagnosis not present

## 2019-03-31 DIAGNOSIS — M79674 Pain in right toe(s): Secondary | ICD-10-CM

## 2019-03-31 DIAGNOSIS — I739 Peripheral vascular disease, unspecified: Secondary | ICD-10-CM | POA: Diagnosis not present

## 2019-03-31 DIAGNOSIS — M79675 Pain in left toe(s): Secondary | ICD-10-CM | POA: Diagnosis not present

## 2019-03-31 DIAGNOSIS — D689 Coagulation defect, unspecified: Secondary | ICD-10-CM | POA: Diagnosis not present

## 2019-03-31 NOTE — Progress Notes (Signed)
   Subjective:    Patient ID: Andrew Salas, male    DOB: [birth year], 85 y.o.   MRN: [id number]  HPI    Review of Systems  All other systems reviewed and are negative.      Objective:   Physical Exam        Assessment & Plan:

## 2019-03-31 NOTE — Progress Notes (Signed)
Subjective: Mahir Aliberti is a 85 y.o. male patient seen today in office with complaint of moderately painful thickened and elongated toenails; unable to trim. Patient denies history of Diabetes or Neuropathy but admits to heart problems and swelling; currently on Eliquis. Patient reports unable to reach toes to trim nails and nails have not been trimmed in years since he left nursing facility. Patient has no other pedal complaints at this time.   Review of Systems  All other systems reviewed and are negative.    Patient Active Problem List   Diagnosis Date Noted  . CKD (chronic kidney disease) 12/24/2016  . Diastolic congestive heart failure (HCC) 12/24/2016  . Cardiomyopathy (HCC) 12/16/2016  . Hypertensive heart disease with heart failure (HCC) 12/16/2016  . 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 Near syncope 11/28/2016  . Hematoma of right flank 11/28/2016  . Hyperlipidemia 11/28/2016  . Abscess in epidural space of lumbar spine 11/17/2016  . Abnormal CXR   . Discitis   . PNA (pneumonia)   . Meningitis 11/11/2016  . Permanent atrial fibrillation 11/11/2016  . Obesity hypoventilation syndrome (HCC), presumed 11/11/2016  . UTI (urinary tract infection) 11/11/2016  . Sepsis (HCC) 11/11/2016  . Osteoarthritis of right knee 02/05/2012    Class: End Stage    Current Outpatient Medications on File Prior to Visit  Medication Sig Dispense Refill  . Acetaminophen (TYLENOL PO) Take by mouth as directed.     Marland Kitchen apixaban (ELIQUIS) 2.5 MG TABS tablet Take 1 tablet (2.5 mg total) by mouth 2 (two) times daily. 60 tablet 11  . azelastine (OPTIVAR) 0.05 % ophthalmic solution 1 drop Two (2) times a day as directed    . benazepril (LOTENSIN) 40 MG tablet Take 40 mg by mouth daily.    . carboxymethylcellulose (REFRESH PLUS) 0.5 % SOLN Place 1 drop into both eyes 3 (three) times daily as needed (for dryness).     . cycloSPORINE (RESTASIS) 0.05 %  ophthalmic emulsion Place 1 drop into both eyes 2 (two) times daily.    . diclofenac sodium (VOLTAREN) 1 % GEL Apply 2 g topically 4 (four) times daily.    Marland Kitchen diltiazem (CARDIZEM CD) 240 MG 24 hr capsule Take 1 capsule (240 mg total) by mouth daily. 30 capsule 0  . DiphenhydrAMINE HCl (BENADRYL PO) Take by mouth as directed.     . ferrous sulfate (FERROUSUL) 325 (65 FE) MG tablet Take 1 tablet (325 mg total) by mouth 3 (three) times daily with meals. 90 tablet 0  . furosemide (LASIX) 40 MG tablet Take 1 tablet (40 mg total) by mouth 2 (two) times daily. (Patient taking differently: Take 40 mg by mouth daily. ) 60 tablet 0  . latanoprost (XALATAN) 0.005 % ophthalmic solution Place 1 drop into both eyes at bedtime.    . Multiple Vitamins-Minerals (PRESERVISION AREDS 2 PO) Take 1 tablet by mouth 2 (two) times daily.    . pravastatin (PRAVACHOL) 40 MG tablet Take 40 mg by mouth daily.     No current facility-administered medications on file prior to visit.     No Known Allergies  Objective: Physical Exam  General: Well developed, nourished, no acute distress, awake, alert and oriented x 3  Gait: Cane assisted  Vascular: Dorsalis pedis artery 1/4 bilateral, Posterior tibial artery 0/4 bilateral, skin temperature warm to cool proximal to distal bilateral lower extremities, + varicosities, scant pedal hair present bilateral. +1 pitting edema bilateral.   Neurological:  Gross sensation present via light touch bilateral.   Dermatological: Skin is warm, dry, and supple bilateral, Nails 1-10 are tender, severely long, thick, and discolored with severe subungal debris, no webspace macerations present bilateral, no open lesions present bilateral, no callus/corns/hyperkeratotic tissue present bilateral. No signs of infection bilateral.  Musculoskeletal: Asymptomatic pes planus and hammertoe boney deformities noted bilateral. Muscular strength within normal limits without painon range of motion. No pain  with calf compression bilateral.  Assessment and Plan:  Problem List Items Addressed This Visit    None    Visit Diagnoses    Pain due to onychomycosis of toenails of both feet    -  Primary   Coagulopathy (HCC)       PVD (peripheral vascular disease) (HCC)          -Examined patient.  -Discussed treatment options for painful mycotic nails. -Mechanically debrided and reduced mycotic nails with sterile nail nipper and dremel nail file without incident. -Recommend elevation to assist with edema control and good supportive shoes daily for foot type -Patient to return in 3 months for follow up evaluation or sooner if symptoms worsen.  Asencion Islamitorya Janeen Watson, DPM

## 2019-05-01 DIAGNOSIS — L03115 Cellulitis of right lower limb: Secondary | ICD-10-CM | POA: Diagnosis not present

## 2019-05-02 DIAGNOSIS — Z791 Long term (current) use of non-steroidal anti-inflammatories (NSAID): Secondary | ICD-10-CM | POA: Diagnosis not present

## 2019-05-02 DIAGNOSIS — N183 Chronic kidney disease, stage 3 (moderate): Secondary | ICD-10-CM | POA: Diagnosis not present

## 2019-05-02 DIAGNOSIS — Z7901 Long term (current) use of anticoagulants: Secondary | ICD-10-CM | POA: Diagnosis not present

## 2019-05-02 DIAGNOSIS — I5032 Chronic diastolic (congestive) heart failure: Secondary | ICD-10-CM | POA: Diagnosis not present

## 2019-05-02 DIAGNOSIS — S81801D Unspecified open wound, right lower leg, subsequent encounter: Secondary | ICD-10-CM | POA: Diagnosis not present

## 2019-05-02 DIAGNOSIS — E785 Hyperlipidemia, unspecified: Secondary | ICD-10-CM | POA: Diagnosis not present

## 2019-05-02 DIAGNOSIS — L03115 Cellulitis of right lower limb: Secondary | ICD-10-CM | POA: Diagnosis not present

## 2019-05-02 DIAGNOSIS — H548 Legal blindness, as defined in USA: Secondary | ICD-10-CM | POA: Diagnosis not present

## 2019-05-02 DIAGNOSIS — I4510 Unspecified right bundle-branch block: Secondary | ICD-10-CM | POA: Diagnosis not present

## 2019-05-02 DIAGNOSIS — J449 Chronic obstructive pulmonary disease, unspecified: Secondary | ICD-10-CM | POA: Diagnosis not present

## 2019-05-02 DIAGNOSIS — H409 Unspecified glaucoma: Secondary | ICD-10-CM | POA: Diagnosis not present

## 2019-05-02 DIAGNOSIS — D509 Iron deficiency anemia, unspecified: Secondary | ICD-10-CM | POA: Diagnosis not present

## 2019-05-02 DIAGNOSIS — D631 Anemia in chronic kidney disease: Secondary | ICD-10-CM | POA: Diagnosis not present

## 2019-05-02 DIAGNOSIS — I700 Atherosclerosis of aorta: Secondary | ICD-10-CM | POA: Diagnosis not present

## 2019-05-02 DIAGNOSIS — I4891 Unspecified atrial fibrillation: Secondary | ICD-10-CM | POA: Diagnosis not present

## 2019-05-02 DIAGNOSIS — I130 Hypertensive heart and chronic kidney disease with heart failure and stage 1 through stage 4 chronic kidney disease, or unspecified chronic kidney disease: Secondary | ICD-10-CM | POA: Diagnosis not present

## 2019-05-02 DIAGNOSIS — H3530 Unspecified macular degeneration: Secondary | ICD-10-CM | POA: Diagnosis not present

## 2019-05-03 DIAGNOSIS — I5032 Chronic diastolic (congestive) heart failure: Secondary | ICD-10-CM | POA: Diagnosis not present

## 2019-05-03 DIAGNOSIS — N183 Chronic kidney disease, stage 3 (moderate): Secondary | ICD-10-CM | POA: Diagnosis not present

## 2019-05-03 DIAGNOSIS — I4510 Unspecified right bundle-branch block: Secondary | ICD-10-CM | POA: Diagnosis not present

## 2019-05-03 DIAGNOSIS — S81801D Unspecified open wound, right lower leg, subsequent encounter: Secondary | ICD-10-CM | POA: Diagnosis not present

## 2019-05-03 DIAGNOSIS — Z7901 Long term (current) use of anticoagulants: Secondary | ICD-10-CM | POA: Diagnosis not present

## 2019-05-03 DIAGNOSIS — E785 Hyperlipidemia, unspecified: Secondary | ICD-10-CM | POA: Diagnosis not present

## 2019-05-03 DIAGNOSIS — H548 Legal blindness, as defined in USA: Secondary | ICD-10-CM | POA: Diagnosis not present

## 2019-05-03 DIAGNOSIS — D509 Iron deficiency anemia, unspecified: Secondary | ICD-10-CM | POA: Diagnosis not present

## 2019-05-03 DIAGNOSIS — I130 Hypertensive heart and chronic kidney disease with heart failure and stage 1 through stage 4 chronic kidney disease, or unspecified chronic kidney disease: Secondary | ICD-10-CM | POA: Diagnosis not present

## 2019-05-03 DIAGNOSIS — J449 Chronic obstructive pulmonary disease, unspecified: Secondary | ICD-10-CM | POA: Diagnosis not present

## 2019-05-03 DIAGNOSIS — Z791 Long term (current) use of non-steroidal anti-inflammatories (NSAID): Secondary | ICD-10-CM | POA: Diagnosis not present

## 2019-05-03 DIAGNOSIS — H3530 Unspecified macular degeneration: Secondary | ICD-10-CM | POA: Diagnosis not present

## 2019-05-03 DIAGNOSIS — I4891 Unspecified atrial fibrillation: Secondary | ICD-10-CM | POA: Diagnosis not present

## 2019-05-03 DIAGNOSIS — D631 Anemia in chronic kidney disease: Secondary | ICD-10-CM | POA: Diagnosis not present

## 2019-05-03 DIAGNOSIS — H409 Unspecified glaucoma: Secondary | ICD-10-CM | POA: Diagnosis not present

## 2019-05-03 DIAGNOSIS — L03115 Cellulitis of right lower limb: Secondary | ICD-10-CM | POA: Diagnosis not present

## 2019-05-03 DIAGNOSIS — I700 Atherosclerosis of aorta: Secondary | ICD-10-CM | POA: Diagnosis not present

## 2019-05-04 DIAGNOSIS — H548 Legal blindness, as defined in USA: Secondary | ICD-10-CM | POA: Diagnosis not present

## 2019-05-04 DIAGNOSIS — N183 Chronic kidney disease, stage 3 (moderate): Secondary | ICD-10-CM | POA: Diagnosis not present

## 2019-05-04 DIAGNOSIS — I4891 Unspecified atrial fibrillation: Secondary | ICD-10-CM | POA: Diagnosis not present

## 2019-05-04 DIAGNOSIS — H3530 Unspecified macular degeneration: Secondary | ICD-10-CM | POA: Diagnosis not present

## 2019-05-04 DIAGNOSIS — E785 Hyperlipidemia, unspecified: Secondary | ICD-10-CM | POA: Diagnosis not present

## 2019-05-04 DIAGNOSIS — S81801D Unspecified open wound, right lower leg, subsequent encounter: Secondary | ICD-10-CM | POA: Diagnosis not present

## 2019-05-04 DIAGNOSIS — I130 Hypertensive heart and chronic kidney disease with heart failure and stage 1 through stage 4 chronic kidney disease, or unspecified chronic kidney disease: Secondary | ICD-10-CM | POA: Diagnosis not present

## 2019-05-04 DIAGNOSIS — H409 Unspecified glaucoma: Secondary | ICD-10-CM | POA: Diagnosis not present

## 2019-05-04 DIAGNOSIS — D509 Iron deficiency anemia, unspecified: Secondary | ICD-10-CM | POA: Diagnosis not present

## 2019-05-04 DIAGNOSIS — I5032 Chronic diastolic (congestive) heart failure: Secondary | ICD-10-CM | POA: Diagnosis not present

## 2019-05-04 DIAGNOSIS — D631 Anemia in chronic kidney disease: Secondary | ICD-10-CM | POA: Diagnosis not present

## 2019-05-04 DIAGNOSIS — Z791 Long term (current) use of non-steroidal anti-inflammatories (NSAID): Secondary | ICD-10-CM | POA: Diagnosis not present

## 2019-05-04 DIAGNOSIS — J449 Chronic obstructive pulmonary disease, unspecified: Secondary | ICD-10-CM | POA: Diagnosis not present

## 2019-05-04 DIAGNOSIS — Z7901 Long term (current) use of anticoagulants: Secondary | ICD-10-CM | POA: Diagnosis not present

## 2019-05-04 DIAGNOSIS — I700 Atherosclerosis of aorta: Secondary | ICD-10-CM | POA: Diagnosis not present

## 2019-05-04 DIAGNOSIS — I4510 Unspecified right bundle-branch block: Secondary | ICD-10-CM | POA: Diagnosis not present

## 2019-05-04 DIAGNOSIS — L03115 Cellulitis of right lower limb: Secondary | ICD-10-CM | POA: Diagnosis not present

## 2019-05-05 DIAGNOSIS — H409 Unspecified glaucoma: Secondary | ICD-10-CM | POA: Diagnosis not present

## 2019-05-05 DIAGNOSIS — L03115 Cellulitis of right lower limb: Secondary | ICD-10-CM | POA: Diagnosis not present

## 2019-05-05 DIAGNOSIS — I4891 Unspecified atrial fibrillation: Secondary | ICD-10-CM | POA: Diagnosis not present

## 2019-05-05 DIAGNOSIS — Z7901 Long term (current) use of anticoagulants: Secondary | ICD-10-CM | POA: Diagnosis not present

## 2019-05-05 DIAGNOSIS — I700 Atherosclerosis of aorta: Secondary | ICD-10-CM | POA: Diagnosis not present

## 2019-05-05 DIAGNOSIS — N183 Chronic kidney disease, stage 3 (moderate): Secondary | ICD-10-CM | POA: Diagnosis not present

## 2019-05-05 DIAGNOSIS — H3530 Unspecified macular degeneration: Secondary | ICD-10-CM | POA: Diagnosis not present

## 2019-05-05 DIAGNOSIS — D631 Anemia in chronic kidney disease: Secondary | ICD-10-CM | POA: Diagnosis not present

## 2019-05-05 DIAGNOSIS — S81801D Unspecified open wound, right lower leg, subsequent encounter: Secondary | ICD-10-CM | POA: Diagnosis not present

## 2019-05-05 DIAGNOSIS — I130 Hypertensive heart and chronic kidney disease with heart failure and stage 1 through stage 4 chronic kidney disease, or unspecified chronic kidney disease: Secondary | ICD-10-CM | POA: Diagnosis not present

## 2019-05-05 DIAGNOSIS — H548 Legal blindness, as defined in USA: Secondary | ICD-10-CM | POA: Diagnosis not present

## 2019-05-05 DIAGNOSIS — I5032 Chronic diastolic (congestive) heart failure: Secondary | ICD-10-CM | POA: Diagnosis not present

## 2019-05-05 DIAGNOSIS — E785 Hyperlipidemia, unspecified: Secondary | ICD-10-CM | POA: Diagnosis not present

## 2019-05-05 DIAGNOSIS — Z791 Long term (current) use of non-steroidal anti-inflammatories (NSAID): Secondary | ICD-10-CM | POA: Diagnosis not present

## 2019-05-05 DIAGNOSIS — I4510 Unspecified right bundle-branch block: Secondary | ICD-10-CM | POA: Diagnosis not present

## 2019-05-05 DIAGNOSIS — D509 Iron deficiency anemia, unspecified: Secondary | ICD-10-CM | POA: Diagnosis not present

## 2019-05-05 DIAGNOSIS — J449 Chronic obstructive pulmonary disease, unspecified: Secondary | ICD-10-CM | POA: Diagnosis not present

## 2019-05-06 DIAGNOSIS — I4510 Unspecified right bundle-branch block: Secondary | ICD-10-CM | POA: Diagnosis not present

## 2019-05-06 DIAGNOSIS — S81801D Unspecified open wound, right lower leg, subsequent encounter: Secondary | ICD-10-CM | POA: Diagnosis not present

## 2019-05-06 DIAGNOSIS — Z791 Long term (current) use of non-steroidal anti-inflammatories (NSAID): Secondary | ICD-10-CM | POA: Diagnosis not present

## 2019-05-06 DIAGNOSIS — I130 Hypertensive heart and chronic kidney disease with heart failure and stage 1 through stage 4 chronic kidney disease, or unspecified chronic kidney disease: Secondary | ICD-10-CM | POA: Diagnosis not present

## 2019-05-06 DIAGNOSIS — I5032 Chronic diastolic (congestive) heart failure: Secondary | ICD-10-CM | POA: Diagnosis not present

## 2019-05-06 DIAGNOSIS — Z7901 Long term (current) use of anticoagulants: Secondary | ICD-10-CM | POA: Diagnosis not present

## 2019-05-06 DIAGNOSIS — H548 Legal blindness, as defined in USA: Secondary | ICD-10-CM | POA: Diagnosis not present

## 2019-05-06 DIAGNOSIS — L03115 Cellulitis of right lower limb: Secondary | ICD-10-CM | POA: Diagnosis not present

## 2019-05-06 DIAGNOSIS — H3530 Unspecified macular degeneration: Secondary | ICD-10-CM | POA: Diagnosis not present

## 2019-05-06 DIAGNOSIS — J449 Chronic obstructive pulmonary disease, unspecified: Secondary | ICD-10-CM | POA: Diagnosis not present

## 2019-05-06 DIAGNOSIS — E785 Hyperlipidemia, unspecified: Secondary | ICD-10-CM | POA: Diagnosis not present

## 2019-05-06 DIAGNOSIS — H409 Unspecified glaucoma: Secondary | ICD-10-CM | POA: Diagnosis not present

## 2019-05-06 DIAGNOSIS — D631 Anemia in chronic kidney disease: Secondary | ICD-10-CM | POA: Diagnosis not present

## 2019-05-06 DIAGNOSIS — I4891 Unspecified atrial fibrillation: Secondary | ICD-10-CM | POA: Diagnosis not present

## 2019-05-06 DIAGNOSIS — I700 Atherosclerosis of aorta: Secondary | ICD-10-CM | POA: Diagnosis not present

## 2019-05-06 DIAGNOSIS — N183 Chronic kidney disease, stage 3 (moderate): Secondary | ICD-10-CM | POA: Diagnosis not present

## 2019-05-06 DIAGNOSIS — D509 Iron deficiency anemia, unspecified: Secondary | ICD-10-CM | POA: Diagnosis not present

## 2019-05-07 DIAGNOSIS — I700 Atherosclerosis of aorta: Secondary | ICD-10-CM | POA: Diagnosis not present

## 2019-05-07 DIAGNOSIS — E785 Hyperlipidemia, unspecified: Secondary | ICD-10-CM | POA: Diagnosis not present

## 2019-05-07 DIAGNOSIS — I5032 Chronic diastolic (congestive) heart failure: Secondary | ICD-10-CM | POA: Diagnosis not present

## 2019-05-07 DIAGNOSIS — S81801D Unspecified open wound, right lower leg, subsequent encounter: Secondary | ICD-10-CM | POA: Diagnosis not present

## 2019-05-07 DIAGNOSIS — L03115 Cellulitis of right lower limb: Secondary | ICD-10-CM | POA: Diagnosis not present

## 2019-05-07 DIAGNOSIS — H409 Unspecified glaucoma: Secondary | ICD-10-CM | POA: Diagnosis not present

## 2019-05-07 DIAGNOSIS — Z7901 Long term (current) use of anticoagulants: Secondary | ICD-10-CM | POA: Diagnosis not present

## 2019-05-07 DIAGNOSIS — H548 Legal blindness, as defined in USA: Secondary | ICD-10-CM | POA: Diagnosis not present

## 2019-05-07 DIAGNOSIS — I130 Hypertensive heart and chronic kidney disease with heart failure and stage 1 through stage 4 chronic kidney disease, or unspecified chronic kidney disease: Secondary | ICD-10-CM | POA: Diagnosis not present

## 2019-05-07 DIAGNOSIS — N183 Chronic kidney disease, stage 3 (moderate): Secondary | ICD-10-CM | POA: Diagnosis not present

## 2019-05-07 DIAGNOSIS — D631 Anemia in chronic kidney disease: Secondary | ICD-10-CM | POA: Diagnosis not present

## 2019-05-07 DIAGNOSIS — Z791 Long term (current) use of non-steroidal anti-inflammatories (NSAID): Secondary | ICD-10-CM | POA: Diagnosis not present

## 2019-05-07 DIAGNOSIS — H3530 Unspecified macular degeneration: Secondary | ICD-10-CM | POA: Diagnosis not present

## 2019-05-07 DIAGNOSIS — I4510 Unspecified right bundle-branch block: Secondary | ICD-10-CM | POA: Diagnosis not present

## 2019-05-07 DIAGNOSIS — I4891 Unspecified atrial fibrillation: Secondary | ICD-10-CM | POA: Diagnosis not present

## 2019-05-07 DIAGNOSIS — J449 Chronic obstructive pulmonary disease, unspecified: Secondary | ICD-10-CM | POA: Diagnosis not present

## 2019-05-07 DIAGNOSIS — D509 Iron deficiency anemia, unspecified: Secondary | ICD-10-CM | POA: Diagnosis not present

## 2019-05-08 DIAGNOSIS — L03115 Cellulitis of right lower limb: Secondary | ICD-10-CM | POA: Diagnosis not present

## 2019-05-08 DIAGNOSIS — N183 Chronic kidney disease, stage 3 (moderate): Secondary | ICD-10-CM | POA: Diagnosis not present

## 2019-05-08 DIAGNOSIS — J449 Chronic obstructive pulmonary disease, unspecified: Secondary | ICD-10-CM | POA: Diagnosis not present

## 2019-05-08 DIAGNOSIS — E785 Hyperlipidemia, unspecified: Secondary | ICD-10-CM | POA: Diagnosis not present

## 2019-05-08 DIAGNOSIS — H548 Legal blindness, as defined in USA: Secondary | ICD-10-CM | POA: Diagnosis not present

## 2019-05-08 DIAGNOSIS — I130 Hypertensive heart and chronic kidney disease with heart failure and stage 1 through stage 4 chronic kidney disease, or unspecified chronic kidney disease: Secondary | ICD-10-CM | POA: Diagnosis not present

## 2019-05-08 DIAGNOSIS — D631 Anemia in chronic kidney disease: Secondary | ICD-10-CM | POA: Diagnosis not present

## 2019-05-08 DIAGNOSIS — I4891 Unspecified atrial fibrillation: Secondary | ICD-10-CM | POA: Diagnosis not present

## 2019-05-08 DIAGNOSIS — S81801D Unspecified open wound, right lower leg, subsequent encounter: Secondary | ICD-10-CM | POA: Diagnosis not present

## 2019-05-08 DIAGNOSIS — D509 Iron deficiency anemia, unspecified: Secondary | ICD-10-CM | POA: Diagnosis not present

## 2019-05-08 DIAGNOSIS — Z791 Long term (current) use of non-steroidal anti-inflammatories (NSAID): Secondary | ICD-10-CM | POA: Diagnosis not present

## 2019-05-08 DIAGNOSIS — I5032 Chronic diastolic (congestive) heart failure: Secondary | ICD-10-CM | POA: Diagnosis not present

## 2019-05-08 DIAGNOSIS — Z7901 Long term (current) use of anticoagulants: Secondary | ICD-10-CM | POA: Diagnosis not present

## 2019-05-08 DIAGNOSIS — I4510 Unspecified right bundle-branch block: Secondary | ICD-10-CM | POA: Diagnosis not present

## 2019-05-08 DIAGNOSIS — I700 Atherosclerosis of aorta: Secondary | ICD-10-CM | POA: Diagnosis not present

## 2019-05-08 DIAGNOSIS — H3530 Unspecified macular degeneration: Secondary | ICD-10-CM | POA: Diagnosis not present

## 2019-05-08 DIAGNOSIS — H409 Unspecified glaucoma: Secondary | ICD-10-CM | POA: Diagnosis not present

## 2019-05-09 DIAGNOSIS — E785 Hyperlipidemia, unspecified: Secondary | ICD-10-CM | POA: Diagnosis not present

## 2019-05-09 DIAGNOSIS — H548 Legal blindness, as defined in USA: Secondary | ICD-10-CM | POA: Diagnosis not present

## 2019-05-09 DIAGNOSIS — I4510 Unspecified right bundle-branch block: Secondary | ICD-10-CM | POA: Diagnosis not present

## 2019-05-09 DIAGNOSIS — N183 Chronic kidney disease, stage 3 (moderate): Secondary | ICD-10-CM | POA: Diagnosis not present

## 2019-05-09 DIAGNOSIS — H409 Unspecified glaucoma: Secondary | ICD-10-CM | POA: Diagnosis not present

## 2019-05-09 DIAGNOSIS — I130 Hypertensive heart and chronic kidney disease with heart failure and stage 1 through stage 4 chronic kidney disease, or unspecified chronic kidney disease: Secondary | ICD-10-CM | POA: Diagnosis not present

## 2019-05-09 DIAGNOSIS — Z791 Long term (current) use of non-steroidal anti-inflammatories (NSAID): Secondary | ICD-10-CM | POA: Diagnosis not present

## 2019-05-09 DIAGNOSIS — J449 Chronic obstructive pulmonary disease, unspecified: Secondary | ICD-10-CM | POA: Diagnosis not present

## 2019-05-09 DIAGNOSIS — D509 Iron deficiency anemia, unspecified: Secondary | ICD-10-CM | POA: Diagnosis not present

## 2019-05-09 DIAGNOSIS — I4891 Unspecified atrial fibrillation: Secondary | ICD-10-CM | POA: Diagnosis not present

## 2019-05-09 DIAGNOSIS — D631 Anemia in chronic kidney disease: Secondary | ICD-10-CM | POA: Diagnosis not present

## 2019-05-09 DIAGNOSIS — Z7901 Long term (current) use of anticoagulants: Secondary | ICD-10-CM | POA: Diagnosis not present

## 2019-05-09 DIAGNOSIS — I700 Atherosclerosis of aorta: Secondary | ICD-10-CM | POA: Diagnosis not present

## 2019-05-09 DIAGNOSIS — L03115 Cellulitis of right lower limb: Secondary | ICD-10-CM | POA: Diagnosis not present

## 2019-05-09 DIAGNOSIS — S81801D Unspecified open wound, right lower leg, subsequent encounter: Secondary | ICD-10-CM | POA: Diagnosis not present

## 2019-05-09 DIAGNOSIS — H3530 Unspecified macular degeneration: Secondary | ICD-10-CM | POA: Diagnosis not present

## 2019-05-09 DIAGNOSIS — I5032 Chronic diastolic (congestive) heart failure: Secondary | ICD-10-CM | POA: Diagnosis not present

## 2019-05-10 DIAGNOSIS — D631 Anemia in chronic kidney disease: Secondary | ICD-10-CM | POA: Diagnosis not present

## 2019-05-10 DIAGNOSIS — H548 Legal blindness, as defined in USA: Secondary | ICD-10-CM | POA: Diagnosis not present

## 2019-05-10 DIAGNOSIS — H409 Unspecified glaucoma: Secondary | ICD-10-CM | POA: Diagnosis not present

## 2019-05-10 DIAGNOSIS — Z7901 Long term (current) use of anticoagulants: Secondary | ICD-10-CM | POA: Diagnosis not present

## 2019-05-10 DIAGNOSIS — D509 Iron deficiency anemia, unspecified: Secondary | ICD-10-CM | POA: Diagnosis not present

## 2019-05-10 DIAGNOSIS — I130 Hypertensive heart and chronic kidney disease with heart failure and stage 1 through stage 4 chronic kidney disease, or unspecified chronic kidney disease: Secondary | ICD-10-CM | POA: Diagnosis not present

## 2019-05-10 DIAGNOSIS — J449 Chronic obstructive pulmonary disease, unspecified: Secondary | ICD-10-CM | POA: Diagnosis not present

## 2019-05-10 DIAGNOSIS — N183 Chronic kidney disease, stage 3 (moderate): Secondary | ICD-10-CM | POA: Diagnosis not present

## 2019-05-10 DIAGNOSIS — E785 Hyperlipidemia, unspecified: Secondary | ICD-10-CM | POA: Diagnosis not present

## 2019-05-10 DIAGNOSIS — Z791 Long term (current) use of non-steroidal anti-inflammatories (NSAID): Secondary | ICD-10-CM | POA: Diagnosis not present

## 2019-05-10 DIAGNOSIS — S81801D Unspecified open wound, right lower leg, subsequent encounter: Secondary | ICD-10-CM | POA: Diagnosis not present

## 2019-05-10 DIAGNOSIS — H3530 Unspecified macular degeneration: Secondary | ICD-10-CM | POA: Diagnosis not present

## 2019-05-10 DIAGNOSIS — I4891 Unspecified atrial fibrillation: Secondary | ICD-10-CM | POA: Diagnosis not present

## 2019-05-10 DIAGNOSIS — I700 Atherosclerosis of aorta: Secondary | ICD-10-CM | POA: Diagnosis not present

## 2019-05-10 DIAGNOSIS — I4510 Unspecified right bundle-branch block: Secondary | ICD-10-CM | POA: Diagnosis not present

## 2019-05-10 DIAGNOSIS — I5032 Chronic diastolic (congestive) heart failure: Secondary | ICD-10-CM | POA: Diagnosis not present

## 2019-05-10 DIAGNOSIS — L03115 Cellulitis of right lower limb: Secondary | ICD-10-CM | POA: Diagnosis not present

## 2019-05-11 DIAGNOSIS — H3530 Unspecified macular degeneration: Secondary | ICD-10-CM | POA: Diagnosis not present

## 2019-05-11 DIAGNOSIS — I700 Atherosclerosis of aorta: Secondary | ICD-10-CM | POA: Diagnosis not present

## 2019-05-11 DIAGNOSIS — H548 Legal blindness, as defined in USA: Secondary | ICD-10-CM | POA: Diagnosis not present

## 2019-05-11 DIAGNOSIS — I5032 Chronic diastolic (congestive) heart failure: Secondary | ICD-10-CM | POA: Diagnosis not present

## 2019-05-11 DIAGNOSIS — J449 Chronic obstructive pulmonary disease, unspecified: Secondary | ICD-10-CM | POA: Diagnosis not present

## 2019-05-11 DIAGNOSIS — H409 Unspecified glaucoma: Secondary | ICD-10-CM | POA: Diagnosis not present

## 2019-05-11 DIAGNOSIS — I4510 Unspecified right bundle-branch block: Secondary | ICD-10-CM | POA: Diagnosis not present

## 2019-05-11 DIAGNOSIS — L03115 Cellulitis of right lower limb: Secondary | ICD-10-CM | POA: Diagnosis not present

## 2019-05-11 DIAGNOSIS — D631 Anemia in chronic kidney disease: Secondary | ICD-10-CM | POA: Diagnosis not present

## 2019-05-11 DIAGNOSIS — I130 Hypertensive heart and chronic kidney disease with heart failure and stage 1 through stage 4 chronic kidney disease, or unspecified chronic kidney disease: Secondary | ICD-10-CM | POA: Diagnosis not present

## 2019-05-11 DIAGNOSIS — D509 Iron deficiency anemia, unspecified: Secondary | ICD-10-CM | POA: Diagnosis not present

## 2019-05-11 DIAGNOSIS — S81801D Unspecified open wound, right lower leg, subsequent encounter: Secondary | ICD-10-CM | POA: Diagnosis not present

## 2019-05-11 DIAGNOSIS — I4891 Unspecified atrial fibrillation: Secondary | ICD-10-CM | POA: Diagnosis not present

## 2019-05-11 DIAGNOSIS — Z791 Long term (current) use of non-steroidal anti-inflammatories (NSAID): Secondary | ICD-10-CM | POA: Diagnosis not present

## 2019-05-11 DIAGNOSIS — E785 Hyperlipidemia, unspecified: Secondary | ICD-10-CM | POA: Diagnosis not present

## 2019-05-11 DIAGNOSIS — N183 Chronic kidney disease, stage 3 (moderate): Secondary | ICD-10-CM | POA: Diagnosis not present

## 2019-05-11 DIAGNOSIS — Z7901 Long term (current) use of anticoagulants: Secondary | ICD-10-CM | POA: Diagnosis not present

## 2019-05-12 DIAGNOSIS — Z7901 Long term (current) use of anticoagulants: Secondary | ICD-10-CM | POA: Diagnosis not present

## 2019-05-12 DIAGNOSIS — Z791 Long term (current) use of non-steroidal anti-inflammatories (NSAID): Secondary | ICD-10-CM | POA: Diagnosis not present

## 2019-05-12 DIAGNOSIS — I4510 Unspecified right bundle-branch block: Secondary | ICD-10-CM | POA: Diagnosis not present

## 2019-05-12 DIAGNOSIS — I5032 Chronic diastolic (congestive) heart failure: Secondary | ICD-10-CM | POA: Diagnosis not present

## 2019-05-12 DIAGNOSIS — E785 Hyperlipidemia, unspecified: Secondary | ICD-10-CM | POA: Diagnosis not present

## 2019-05-12 DIAGNOSIS — S81801D Unspecified open wound, right lower leg, subsequent encounter: Secondary | ICD-10-CM | POA: Diagnosis not present

## 2019-05-12 DIAGNOSIS — D631 Anemia in chronic kidney disease: Secondary | ICD-10-CM | POA: Diagnosis not present

## 2019-05-12 DIAGNOSIS — I700 Atherosclerosis of aorta: Secondary | ICD-10-CM | POA: Diagnosis not present

## 2019-05-12 DIAGNOSIS — I130 Hypertensive heart and chronic kidney disease with heart failure and stage 1 through stage 4 chronic kidney disease, or unspecified chronic kidney disease: Secondary | ICD-10-CM | POA: Diagnosis not present

## 2019-05-12 DIAGNOSIS — N183 Chronic kidney disease, stage 3 (moderate): Secondary | ICD-10-CM | POA: Diagnosis not present

## 2019-05-12 DIAGNOSIS — L03115 Cellulitis of right lower limb: Secondary | ICD-10-CM | POA: Diagnosis not present

## 2019-05-12 DIAGNOSIS — J449 Chronic obstructive pulmonary disease, unspecified: Secondary | ICD-10-CM | POA: Diagnosis not present

## 2019-05-12 DIAGNOSIS — H3530 Unspecified macular degeneration: Secondary | ICD-10-CM | POA: Diagnosis not present

## 2019-05-12 DIAGNOSIS — D509 Iron deficiency anemia, unspecified: Secondary | ICD-10-CM | POA: Diagnosis not present

## 2019-05-12 DIAGNOSIS — H409 Unspecified glaucoma: Secondary | ICD-10-CM | POA: Diagnosis not present

## 2019-05-12 DIAGNOSIS — I4891 Unspecified atrial fibrillation: Secondary | ICD-10-CM | POA: Diagnosis not present

## 2019-05-12 DIAGNOSIS — H548 Legal blindness, as defined in USA: Secondary | ICD-10-CM | POA: Diagnosis not present

## 2019-05-15 DIAGNOSIS — I4510 Unspecified right bundle-branch block: Secondary | ICD-10-CM | POA: Diagnosis not present

## 2019-05-15 DIAGNOSIS — Z791 Long term (current) use of non-steroidal anti-inflammatories (NSAID): Secondary | ICD-10-CM | POA: Diagnosis not present

## 2019-05-15 DIAGNOSIS — J449 Chronic obstructive pulmonary disease, unspecified: Secondary | ICD-10-CM | POA: Diagnosis not present

## 2019-05-15 DIAGNOSIS — E785 Hyperlipidemia, unspecified: Secondary | ICD-10-CM | POA: Diagnosis not present

## 2019-05-15 DIAGNOSIS — H3530 Unspecified macular degeneration: Secondary | ICD-10-CM | POA: Diagnosis not present

## 2019-05-15 DIAGNOSIS — Z7901 Long term (current) use of anticoagulants: Secondary | ICD-10-CM | POA: Diagnosis not present

## 2019-05-15 DIAGNOSIS — I700 Atherosclerosis of aorta: Secondary | ICD-10-CM | POA: Diagnosis not present

## 2019-05-15 DIAGNOSIS — D631 Anemia in chronic kidney disease: Secondary | ICD-10-CM | POA: Diagnosis not present

## 2019-05-15 DIAGNOSIS — L03115 Cellulitis of right lower limb: Secondary | ICD-10-CM | POA: Diagnosis not present

## 2019-05-15 DIAGNOSIS — H409 Unspecified glaucoma: Secondary | ICD-10-CM | POA: Diagnosis not present

## 2019-05-15 DIAGNOSIS — I4891 Unspecified atrial fibrillation: Secondary | ICD-10-CM | POA: Diagnosis not present

## 2019-05-15 DIAGNOSIS — D509 Iron deficiency anemia, unspecified: Secondary | ICD-10-CM | POA: Diagnosis not present

## 2019-05-15 DIAGNOSIS — S81801D Unspecified open wound, right lower leg, subsequent encounter: Secondary | ICD-10-CM | POA: Diagnosis not present

## 2019-05-15 DIAGNOSIS — N183 Chronic kidney disease, stage 3 (moderate): Secondary | ICD-10-CM | POA: Diagnosis not present

## 2019-05-15 DIAGNOSIS — I5032 Chronic diastolic (congestive) heart failure: Secondary | ICD-10-CM | POA: Diagnosis not present

## 2019-05-15 DIAGNOSIS — I130 Hypertensive heart and chronic kidney disease with heart failure and stage 1 through stage 4 chronic kidney disease, or unspecified chronic kidney disease: Secondary | ICD-10-CM | POA: Diagnosis not present

## 2019-05-15 DIAGNOSIS — H548 Legal blindness, as defined in USA: Secondary | ICD-10-CM | POA: Diagnosis not present

## 2019-05-18 DIAGNOSIS — I4891 Unspecified atrial fibrillation: Secondary | ICD-10-CM | POA: Diagnosis not present

## 2019-05-18 DIAGNOSIS — H548 Legal blindness, as defined in USA: Secondary | ICD-10-CM | POA: Diagnosis not present

## 2019-05-18 DIAGNOSIS — I4510 Unspecified right bundle-branch block: Secondary | ICD-10-CM | POA: Diagnosis not present

## 2019-05-18 DIAGNOSIS — I130 Hypertensive heart and chronic kidney disease with heart failure and stage 1 through stage 4 chronic kidney disease, or unspecified chronic kidney disease: Secondary | ICD-10-CM | POA: Diagnosis not present

## 2019-05-18 DIAGNOSIS — J449 Chronic obstructive pulmonary disease, unspecified: Secondary | ICD-10-CM | POA: Diagnosis not present

## 2019-05-18 DIAGNOSIS — H3530 Unspecified macular degeneration: Secondary | ICD-10-CM | POA: Diagnosis not present

## 2019-05-18 DIAGNOSIS — I5032 Chronic diastolic (congestive) heart failure: Secondary | ICD-10-CM | POA: Diagnosis not present

## 2019-05-18 DIAGNOSIS — E785 Hyperlipidemia, unspecified: Secondary | ICD-10-CM | POA: Diagnosis not present

## 2019-05-18 DIAGNOSIS — L03115 Cellulitis of right lower limb: Secondary | ICD-10-CM | POA: Diagnosis not present

## 2019-05-18 DIAGNOSIS — Z7901 Long term (current) use of anticoagulants: Secondary | ICD-10-CM | POA: Diagnosis not present

## 2019-05-18 DIAGNOSIS — S81801D Unspecified open wound, right lower leg, subsequent encounter: Secondary | ICD-10-CM | POA: Diagnosis not present

## 2019-05-18 DIAGNOSIS — D509 Iron deficiency anemia, unspecified: Secondary | ICD-10-CM | POA: Diagnosis not present

## 2019-05-18 DIAGNOSIS — H409 Unspecified glaucoma: Secondary | ICD-10-CM | POA: Diagnosis not present

## 2019-05-18 DIAGNOSIS — Z791 Long term (current) use of non-steroidal anti-inflammatories (NSAID): Secondary | ICD-10-CM | POA: Diagnosis not present

## 2019-05-18 DIAGNOSIS — I700 Atherosclerosis of aorta: Secondary | ICD-10-CM | POA: Diagnosis not present

## 2019-05-18 DIAGNOSIS — N183 Chronic kidney disease, stage 3 (moderate): Secondary | ICD-10-CM | POA: Diagnosis not present

## 2019-05-18 DIAGNOSIS — D631 Anemia in chronic kidney disease: Secondary | ICD-10-CM | POA: Diagnosis not present

## 2019-05-24 DIAGNOSIS — I700 Atherosclerosis of aorta: Secondary | ICD-10-CM | POA: Diagnosis not present

## 2019-05-24 DIAGNOSIS — I130 Hypertensive heart and chronic kidney disease with heart failure and stage 1 through stage 4 chronic kidney disease, or unspecified chronic kidney disease: Secondary | ICD-10-CM | POA: Diagnosis not present

## 2019-05-24 DIAGNOSIS — H548 Legal blindness, as defined in USA: Secondary | ICD-10-CM | POA: Diagnosis not present

## 2019-05-24 DIAGNOSIS — J449 Chronic obstructive pulmonary disease, unspecified: Secondary | ICD-10-CM | POA: Diagnosis not present

## 2019-05-24 DIAGNOSIS — Z7901 Long term (current) use of anticoagulants: Secondary | ICD-10-CM | POA: Diagnosis not present

## 2019-05-24 DIAGNOSIS — I5032 Chronic diastolic (congestive) heart failure: Secondary | ICD-10-CM | POA: Diagnosis not present

## 2019-05-24 DIAGNOSIS — I4891 Unspecified atrial fibrillation: Secondary | ICD-10-CM | POA: Diagnosis not present

## 2019-05-24 DIAGNOSIS — L03115 Cellulitis of right lower limb: Secondary | ICD-10-CM | POA: Diagnosis not present

## 2019-05-24 DIAGNOSIS — H3530 Unspecified macular degeneration: Secondary | ICD-10-CM | POA: Diagnosis not present

## 2019-05-24 DIAGNOSIS — N183 Chronic kidney disease, stage 3 (moderate): Secondary | ICD-10-CM | POA: Diagnosis not present

## 2019-05-24 DIAGNOSIS — D631 Anemia in chronic kidney disease: Secondary | ICD-10-CM | POA: Diagnosis not present

## 2019-05-24 DIAGNOSIS — H409 Unspecified glaucoma: Secondary | ICD-10-CM | POA: Diagnosis not present

## 2019-05-24 DIAGNOSIS — I4510 Unspecified right bundle-branch block: Secondary | ICD-10-CM | POA: Diagnosis not present

## 2019-05-24 DIAGNOSIS — D509 Iron deficiency anemia, unspecified: Secondary | ICD-10-CM | POA: Diagnosis not present

## 2019-05-24 DIAGNOSIS — S81801D Unspecified open wound, right lower leg, subsequent encounter: Secondary | ICD-10-CM | POA: Diagnosis not present

## 2019-05-24 DIAGNOSIS — Z791 Long term (current) use of non-steroidal anti-inflammatories (NSAID): Secondary | ICD-10-CM | POA: Diagnosis not present

## 2019-05-24 DIAGNOSIS — E785 Hyperlipidemia, unspecified: Secondary | ICD-10-CM | POA: Diagnosis not present

## 2019-05-26 DIAGNOSIS — D631 Anemia in chronic kidney disease: Secondary | ICD-10-CM | POA: Diagnosis not present

## 2019-05-26 DIAGNOSIS — H548 Legal blindness, as defined in USA: Secondary | ICD-10-CM | POA: Diagnosis not present

## 2019-05-26 DIAGNOSIS — I4510 Unspecified right bundle-branch block: Secondary | ICD-10-CM | POA: Diagnosis not present

## 2019-05-26 DIAGNOSIS — I700 Atherosclerosis of aorta: Secondary | ICD-10-CM | POA: Diagnosis not present

## 2019-05-26 DIAGNOSIS — S81801D Unspecified open wound, right lower leg, subsequent encounter: Secondary | ICD-10-CM | POA: Diagnosis not present

## 2019-05-26 DIAGNOSIS — L03115 Cellulitis of right lower limb: Secondary | ICD-10-CM | POA: Diagnosis not present

## 2019-05-26 DIAGNOSIS — E785 Hyperlipidemia, unspecified: Secondary | ICD-10-CM | POA: Diagnosis not present

## 2019-05-26 DIAGNOSIS — I4891 Unspecified atrial fibrillation: Secondary | ICD-10-CM | POA: Diagnosis not present

## 2019-05-26 DIAGNOSIS — H409 Unspecified glaucoma: Secondary | ICD-10-CM | POA: Diagnosis not present

## 2019-05-26 DIAGNOSIS — J449 Chronic obstructive pulmonary disease, unspecified: Secondary | ICD-10-CM | POA: Diagnosis not present

## 2019-05-26 DIAGNOSIS — I5032 Chronic diastolic (congestive) heart failure: Secondary | ICD-10-CM | POA: Diagnosis not present

## 2019-05-26 DIAGNOSIS — I130 Hypertensive heart and chronic kidney disease with heart failure and stage 1 through stage 4 chronic kidney disease, or unspecified chronic kidney disease: Secondary | ICD-10-CM | POA: Diagnosis not present

## 2019-05-26 DIAGNOSIS — H3530 Unspecified macular degeneration: Secondary | ICD-10-CM | POA: Diagnosis not present

## 2019-05-26 DIAGNOSIS — N183 Chronic kidney disease, stage 3 (moderate): Secondary | ICD-10-CM | POA: Diagnosis not present

## 2019-05-26 DIAGNOSIS — Z7901 Long term (current) use of anticoagulants: Secondary | ICD-10-CM | POA: Diagnosis not present

## 2019-05-26 DIAGNOSIS — Z791 Long term (current) use of non-steroidal anti-inflammatories (NSAID): Secondary | ICD-10-CM | POA: Diagnosis not present

## 2019-05-26 DIAGNOSIS — D509 Iron deficiency anemia, unspecified: Secondary | ICD-10-CM | POA: Diagnosis not present

## 2019-05-30 DIAGNOSIS — D509 Iron deficiency anemia, unspecified: Secondary | ICD-10-CM | POA: Diagnosis not present

## 2019-05-30 DIAGNOSIS — N183 Chronic kidney disease, stage 3 (moderate): Secondary | ICD-10-CM | POA: Diagnosis not present

## 2019-05-30 DIAGNOSIS — D631 Anemia in chronic kidney disease: Secondary | ICD-10-CM | POA: Diagnosis not present

## 2019-05-30 DIAGNOSIS — H409 Unspecified glaucoma: Secondary | ICD-10-CM | POA: Diagnosis not present

## 2019-05-30 DIAGNOSIS — I700 Atherosclerosis of aorta: Secondary | ICD-10-CM | POA: Diagnosis not present

## 2019-05-30 DIAGNOSIS — S81801D Unspecified open wound, right lower leg, subsequent encounter: Secondary | ICD-10-CM | POA: Diagnosis not present

## 2019-05-30 DIAGNOSIS — I5032 Chronic diastolic (congestive) heart failure: Secondary | ICD-10-CM | POA: Diagnosis not present

## 2019-05-30 DIAGNOSIS — Z791 Long term (current) use of non-steroidal anti-inflammatories (NSAID): Secondary | ICD-10-CM | POA: Diagnosis not present

## 2019-05-30 DIAGNOSIS — E785 Hyperlipidemia, unspecified: Secondary | ICD-10-CM | POA: Diagnosis not present

## 2019-05-30 DIAGNOSIS — I4891 Unspecified atrial fibrillation: Secondary | ICD-10-CM | POA: Diagnosis not present

## 2019-05-30 DIAGNOSIS — L03115 Cellulitis of right lower limb: Secondary | ICD-10-CM | POA: Diagnosis not present

## 2019-05-30 DIAGNOSIS — H548 Legal blindness, as defined in USA: Secondary | ICD-10-CM | POA: Diagnosis not present

## 2019-05-30 DIAGNOSIS — J449 Chronic obstructive pulmonary disease, unspecified: Secondary | ICD-10-CM | POA: Diagnosis not present

## 2019-05-30 DIAGNOSIS — H3530 Unspecified macular degeneration: Secondary | ICD-10-CM | POA: Diagnosis not present

## 2019-05-30 DIAGNOSIS — Z7901 Long term (current) use of anticoagulants: Secondary | ICD-10-CM | POA: Diagnosis not present

## 2019-05-30 DIAGNOSIS — I4510 Unspecified right bundle-branch block: Secondary | ICD-10-CM | POA: Diagnosis not present

## 2019-05-30 DIAGNOSIS — I130 Hypertensive heart and chronic kidney disease with heart failure and stage 1 through stage 4 chronic kidney disease, or unspecified chronic kidney disease: Secondary | ICD-10-CM | POA: Diagnosis not present

## 2019-06-02 ENCOUNTER — Ambulatory Visit: Payer: Medicare Other | Admitting: Sports Medicine

## 2019-06-23 DIAGNOSIS — L03115 Cellulitis of right lower limb: Secondary | ICD-10-CM | POA: Diagnosis not present

## 2019-06-26 DIAGNOSIS — I5032 Chronic diastolic (congestive) heart failure: Secondary | ICD-10-CM | POA: Diagnosis not present

## 2019-06-26 DIAGNOSIS — Z79899 Other long term (current) drug therapy: Secondary | ICD-10-CM | POA: Diagnosis not present

## 2019-06-26 DIAGNOSIS — H548 Legal blindness, as defined in USA: Secondary | ICD-10-CM | POA: Diagnosis not present

## 2019-06-26 DIAGNOSIS — I700 Atherosclerosis of aorta: Secondary | ICD-10-CM | POA: Diagnosis not present

## 2019-06-26 DIAGNOSIS — M5442 Lumbago with sciatica, left side: Secondary | ICD-10-CM | POA: Diagnosis not present

## 2019-06-26 DIAGNOSIS — J329 Chronic sinusitis, unspecified: Secondary | ICD-10-CM | POA: Diagnosis not present

## 2019-06-26 DIAGNOSIS — N183 Chronic kidney disease, stage 3 (moderate): Secondary | ICD-10-CM | POA: Diagnosis not present

## 2019-06-26 DIAGNOSIS — J449 Chronic obstructive pulmonary disease, unspecified: Secondary | ICD-10-CM | POA: Diagnosis not present

## 2019-06-26 DIAGNOSIS — I130 Hypertensive heart and chronic kidney disease with heart failure and stage 1 through stage 4 chronic kidney disease, or unspecified chronic kidney disease: Secondary | ICD-10-CM | POA: Diagnosis not present

## 2019-06-26 DIAGNOSIS — L03116 Cellulitis of left lower limb: Secondary | ICD-10-CM | POA: Diagnosis not present

## 2019-06-26 DIAGNOSIS — I4891 Unspecified atrial fibrillation: Secondary | ICD-10-CM | POA: Diagnosis not present

## 2019-06-26 DIAGNOSIS — Z7901 Long term (current) use of anticoagulants: Secondary | ICD-10-CM | POA: Diagnosis not present

## 2019-06-26 DIAGNOSIS — N3289 Other specified disorders of bladder: Secondary | ICD-10-CM | POA: Diagnosis not present

## 2019-06-26 DIAGNOSIS — H3530 Unspecified macular degeneration: Secondary | ICD-10-CM | POA: Diagnosis not present

## 2019-06-26 DIAGNOSIS — Z96651 Presence of right artificial knee joint: Secondary | ICD-10-CM | POA: Diagnosis not present

## 2019-06-26 DIAGNOSIS — I4510 Unspecified right bundle-branch block: Secondary | ICD-10-CM | POA: Diagnosis not present

## 2019-06-26 DIAGNOSIS — E785 Hyperlipidemia, unspecified: Secondary | ICD-10-CM | POA: Diagnosis not present

## 2019-06-26 DIAGNOSIS — D509 Iron deficiency anemia, unspecified: Secondary | ICD-10-CM | POA: Diagnosis not present

## 2019-06-26 DIAGNOSIS — L03115 Cellulitis of right lower limb: Secondary | ICD-10-CM | POA: Diagnosis not present

## 2019-06-26 DIAGNOSIS — N3281 Overactive bladder: Secondary | ICD-10-CM | POA: Diagnosis not present

## 2019-06-29 DIAGNOSIS — L03115 Cellulitis of right lower limb: Secondary | ICD-10-CM | POA: Diagnosis not present

## 2019-06-29 DIAGNOSIS — I130 Hypertensive heart and chronic kidney disease with heart failure and stage 1 through stage 4 chronic kidney disease, or unspecified chronic kidney disease: Secondary | ICD-10-CM | POA: Diagnosis not present

## 2019-06-29 DIAGNOSIS — N3281 Overactive bladder: Secondary | ICD-10-CM | POA: Diagnosis not present

## 2019-06-29 DIAGNOSIS — N3289 Other specified disorders of bladder: Secondary | ICD-10-CM | POA: Diagnosis not present

## 2019-06-29 DIAGNOSIS — Z7901 Long term (current) use of anticoagulants: Secondary | ICD-10-CM | POA: Diagnosis not present

## 2019-06-29 DIAGNOSIS — E785 Hyperlipidemia, unspecified: Secondary | ICD-10-CM | POA: Diagnosis not present

## 2019-06-29 DIAGNOSIS — M5442 Lumbago with sciatica, left side: Secondary | ICD-10-CM | POA: Diagnosis not present

## 2019-06-29 DIAGNOSIS — D509 Iron deficiency anemia, unspecified: Secondary | ICD-10-CM | POA: Diagnosis not present

## 2019-06-29 DIAGNOSIS — I700 Atherosclerosis of aorta: Secondary | ICD-10-CM | POA: Diagnosis not present

## 2019-06-29 DIAGNOSIS — Z96651 Presence of right artificial knee joint: Secondary | ICD-10-CM | POA: Diagnosis not present

## 2019-06-29 DIAGNOSIS — N183 Chronic kidney disease, stage 3 (moderate): Secondary | ICD-10-CM | POA: Diagnosis not present

## 2019-06-29 DIAGNOSIS — L03116 Cellulitis of left lower limb: Secondary | ICD-10-CM | POA: Diagnosis not present

## 2019-06-29 DIAGNOSIS — H3530 Unspecified macular degeneration: Secondary | ICD-10-CM | POA: Diagnosis not present

## 2019-06-29 DIAGNOSIS — H548 Legal blindness, as defined in USA: Secondary | ICD-10-CM | POA: Diagnosis not present

## 2019-06-29 DIAGNOSIS — Z79899 Other long term (current) drug therapy: Secondary | ICD-10-CM | POA: Diagnosis not present

## 2019-06-29 DIAGNOSIS — I5032 Chronic diastolic (congestive) heart failure: Secondary | ICD-10-CM | POA: Diagnosis not present

## 2019-06-29 DIAGNOSIS — I4510 Unspecified right bundle-branch block: Secondary | ICD-10-CM | POA: Diagnosis not present

## 2019-06-29 DIAGNOSIS — J329 Chronic sinusitis, unspecified: Secondary | ICD-10-CM | POA: Diagnosis not present

## 2019-06-29 DIAGNOSIS — J449 Chronic obstructive pulmonary disease, unspecified: Secondary | ICD-10-CM | POA: Diagnosis not present

## 2019-06-29 DIAGNOSIS — I4891 Unspecified atrial fibrillation: Secondary | ICD-10-CM | POA: Diagnosis not present

## 2019-07-03 DIAGNOSIS — N3281 Overactive bladder: Secondary | ICD-10-CM | POA: Diagnosis not present

## 2019-07-03 DIAGNOSIS — L03115 Cellulitis of right lower limb: Secondary | ICD-10-CM | POA: Diagnosis not present

## 2019-07-03 DIAGNOSIS — E785 Hyperlipidemia, unspecified: Secondary | ICD-10-CM | POA: Diagnosis not present

## 2019-07-03 DIAGNOSIS — I5032 Chronic diastolic (congestive) heart failure: Secondary | ICD-10-CM | POA: Diagnosis not present

## 2019-07-03 DIAGNOSIS — D509 Iron deficiency anemia, unspecified: Secondary | ICD-10-CM | POA: Diagnosis not present

## 2019-07-03 DIAGNOSIS — H548 Legal blindness, as defined in USA: Secondary | ICD-10-CM | POA: Diagnosis not present

## 2019-07-03 DIAGNOSIS — M5442 Lumbago with sciatica, left side: Secondary | ICD-10-CM | POA: Diagnosis not present

## 2019-07-03 DIAGNOSIS — I4891 Unspecified atrial fibrillation: Secondary | ICD-10-CM | POA: Diagnosis not present

## 2019-07-03 DIAGNOSIS — H3530 Unspecified macular degeneration: Secondary | ICD-10-CM | POA: Diagnosis not present

## 2019-07-03 DIAGNOSIS — J329 Chronic sinusitis, unspecified: Secondary | ICD-10-CM | POA: Diagnosis not present

## 2019-07-03 DIAGNOSIS — L03116 Cellulitis of left lower limb: Secondary | ICD-10-CM | POA: Diagnosis not present

## 2019-07-03 DIAGNOSIS — N183 Chronic kidney disease, stage 3 (moderate): Secondary | ICD-10-CM | POA: Diagnosis not present

## 2019-07-03 DIAGNOSIS — Z7901 Long term (current) use of anticoagulants: Secondary | ICD-10-CM | POA: Diagnosis not present

## 2019-07-03 DIAGNOSIS — Z96651 Presence of right artificial knee joint: Secondary | ICD-10-CM | POA: Diagnosis not present

## 2019-07-03 DIAGNOSIS — I4510 Unspecified right bundle-branch block: Secondary | ICD-10-CM | POA: Diagnosis not present

## 2019-07-03 DIAGNOSIS — I130 Hypertensive heart and chronic kidney disease with heart failure and stage 1 through stage 4 chronic kidney disease, or unspecified chronic kidney disease: Secondary | ICD-10-CM | POA: Diagnosis not present

## 2019-07-03 DIAGNOSIS — J449 Chronic obstructive pulmonary disease, unspecified: Secondary | ICD-10-CM | POA: Diagnosis not present

## 2019-07-03 DIAGNOSIS — N3289 Other specified disorders of bladder: Secondary | ICD-10-CM | POA: Diagnosis not present

## 2019-07-03 DIAGNOSIS — Z79899 Other long term (current) drug therapy: Secondary | ICD-10-CM | POA: Diagnosis not present

## 2019-07-03 DIAGNOSIS — I700 Atherosclerosis of aorta: Secondary | ICD-10-CM | POA: Diagnosis not present

## 2019-07-07 DIAGNOSIS — Z79899 Other long term (current) drug therapy: Secondary | ICD-10-CM | POA: Diagnosis not present

## 2019-07-07 DIAGNOSIS — I5032 Chronic diastolic (congestive) heart failure: Secondary | ICD-10-CM | POA: Diagnosis not present

## 2019-07-07 DIAGNOSIS — I4891 Unspecified atrial fibrillation: Secondary | ICD-10-CM | POA: Diagnosis not present

## 2019-07-07 DIAGNOSIS — L03116 Cellulitis of left lower limb: Secondary | ICD-10-CM | POA: Diagnosis not present

## 2019-07-07 DIAGNOSIS — J449 Chronic obstructive pulmonary disease, unspecified: Secondary | ICD-10-CM | POA: Diagnosis not present

## 2019-07-07 DIAGNOSIS — N3289 Other specified disorders of bladder: Secondary | ICD-10-CM | POA: Diagnosis not present

## 2019-07-07 DIAGNOSIS — N183 Chronic kidney disease, stage 3 (moderate): Secondary | ICD-10-CM | POA: Diagnosis not present

## 2019-07-07 DIAGNOSIS — I130 Hypertensive heart and chronic kidney disease with heart failure and stage 1 through stage 4 chronic kidney disease, or unspecified chronic kidney disease: Secondary | ICD-10-CM | POA: Diagnosis not present

## 2019-07-07 DIAGNOSIS — I4510 Unspecified right bundle-branch block: Secondary | ICD-10-CM | POA: Diagnosis not present

## 2019-07-07 DIAGNOSIS — Z7901 Long term (current) use of anticoagulants: Secondary | ICD-10-CM | POA: Diagnosis not present

## 2019-07-07 DIAGNOSIS — N3281 Overactive bladder: Secondary | ICD-10-CM | POA: Diagnosis not present

## 2019-07-07 DIAGNOSIS — I700 Atherosclerosis of aorta: Secondary | ICD-10-CM | POA: Diagnosis not present

## 2019-07-07 DIAGNOSIS — H3530 Unspecified macular degeneration: Secondary | ICD-10-CM | POA: Diagnosis not present

## 2019-07-07 DIAGNOSIS — E785 Hyperlipidemia, unspecified: Secondary | ICD-10-CM | POA: Diagnosis not present

## 2019-07-07 DIAGNOSIS — Z96651 Presence of right artificial knee joint: Secondary | ICD-10-CM | POA: Diagnosis not present

## 2019-07-07 DIAGNOSIS — L03115 Cellulitis of right lower limb: Secondary | ICD-10-CM | POA: Diagnosis not present

## 2019-07-07 DIAGNOSIS — D509 Iron deficiency anemia, unspecified: Secondary | ICD-10-CM | POA: Diagnosis not present

## 2019-07-07 DIAGNOSIS — M5442 Lumbago with sciatica, left side: Secondary | ICD-10-CM | POA: Diagnosis not present

## 2019-07-07 DIAGNOSIS — J329 Chronic sinusitis, unspecified: Secondary | ICD-10-CM | POA: Diagnosis not present

## 2019-07-07 DIAGNOSIS — H548 Legal blindness, as defined in USA: Secondary | ICD-10-CM | POA: Diagnosis not present

## 2019-07-10 DIAGNOSIS — Z96651 Presence of right artificial knee joint: Secondary | ICD-10-CM | POA: Diagnosis not present

## 2019-07-10 DIAGNOSIS — L03115 Cellulitis of right lower limb: Secondary | ICD-10-CM | POA: Diagnosis not present

## 2019-07-10 DIAGNOSIS — Z7901 Long term (current) use of anticoagulants: Secondary | ICD-10-CM | POA: Diagnosis not present

## 2019-07-10 DIAGNOSIS — M5442 Lumbago with sciatica, left side: Secondary | ICD-10-CM | POA: Diagnosis not present

## 2019-07-10 DIAGNOSIS — N3281 Overactive bladder: Secondary | ICD-10-CM | POA: Diagnosis not present

## 2019-07-10 DIAGNOSIS — E785 Hyperlipidemia, unspecified: Secondary | ICD-10-CM | POA: Diagnosis not present

## 2019-07-10 DIAGNOSIS — Z79899 Other long term (current) drug therapy: Secondary | ICD-10-CM | POA: Diagnosis not present

## 2019-07-10 DIAGNOSIS — J329 Chronic sinusitis, unspecified: Secondary | ICD-10-CM | POA: Diagnosis not present

## 2019-07-10 DIAGNOSIS — I130 Hypertensive heart and chronic kidney disease with heart failure and stage 1 through stage 4 chronic kidney disease, or unspecified chronic kidney disease: Secondary | ICD-10-CM | POA: Diagnosis not present

## 2019-07-10 DIAGNOSIS — D509 Iron deficiency anemia, unspecified: Secondary | ICD-10-CM | POA: Diagnosis not present

## 2019-07-10 DIAGNOSIS — H3530 Unspecified macular degeneration: Secondary | ICD-10-CM | POA: Diagnosis not present

## 2019-07-10 DIAGNOSIS — N3289 Other specified disorders of bladder: Secondary | ICD-10-CM | POA: Diagnosis not present

## 2019-07-10 DIAGNOSIS — I700 Atherosclerosis of aorta: Secondary | ICD-10-CM | POA: Diagnosis not present

## 2019-07-10 DIAGNOSIS — N183 Chronic kidney disease, stage 3 (moderate): Secondary | ICD-10-CM | POA: Diagnosis not present

## 2019-07-10 DIAGNOSIS — I5032 Chronic diastolic (congestive) heart failure: Secondary | ICD-10-CM | POA: Diagnosis not present

## 2019-07-10 DIAGNOSIS — H548 Legal blindness, as defined in USA: Secondary | ICD-10-CM | POA: Diagnosis not present

## 2019-07-10 DIAGNOSIS — J449 Chronic obstructive pulmonary disease, unspecified: Secondary | ICD-10-CM | POA: Diagnosis not present

## 2019-07-10 DIAGNOSIS — I4510 Unspecified right bundle-branch block: Secondary | ICD-10-CM | POA: Diagnosis not present

## 2019-07-10 DIAGNOSIS — L03116 Cellulitis of left lower limb: Secondary | ICD-10-CM | POA: Diagnosis not present

## 2019-07-10 DIAGNOSIS — I4891 Unspecified atrial fibrillation: Secondary | ICD-10-CM | POA: Diagnosis not present

## 2019-07-17 DIAGNOSIS — N183 Chronic kidney disease, stage 3 (moderate): Secondary | ICD-10-CM | POA: Diagnosis not present

## 2019-07-17 DIAGNOSIS — M5442 Lumbago with sciatica, left side: Secondary | ICD-10-CM | POA: Diagnosis not present

## 2019-07-17 DIAGNOSIS — J329 Chronic sinusitis, unspecified: Secondary | ICD-10-CM | POA: Diagnosis not present

## 2019-07-17 DIAGNOSIS — I5032 Chronic diastolic (congestive) heart failure: Secondary | ICD-10-CM | POA: Diagnosis not present

## 2019-07-17 DIAGNOSIS — Z79899 Other long term (current) drug therapy: Secondary | ICD-10-CM | POA: Diagnosis not present

## 2019-07-17 DIAGNOSIS — I700 Atherosclerosis of aorta: Secondary | ICD-10-CM | POA: Diagnosis not present

## 2019-07-17 DIAGNOSIS — H3530 Unspecified macular degeneration: Secondary | ICD-10-CM | POA: Diagnosis not present

## 2019-07-17 DIAGNOSIS — Z7901 Long term (current) use of anticoagulants: Secondary | ICD-10-CM | POA: Diagnosis not present

## 2019-07-17 DIAGNOSIS — I4891 Unspecified atrial fibrillation: Secondary | ICD-10-CM | POA: Diagnosis not present

## 2019-07-17 DIAGNOSIS — N3281 Overactive bladder: Secondary | ICD-10-CM | POA: Diagnosis not present

## 2019-07-17 DIAGNOSIS — I4510 Unspecified right bundle-branch block: Secondary | ICD-10-CM | POA: Diagnosis not present

## 2019-07-17 DIAGNOSIS — L03116 Cellulitis of left lower limb: Secondary | ICD-10-CM | POA: Diagnosis not present

## 2019-07-17 DIAGNOSIS — E785 Hyperlipidemia, unspecified: Secondary | ICD-10-CM | POA: Diagnosis not present

## 2019-07-17 DIAGNOSIS — Z96651 Presence of right artificial knee joint: Secondary | ICD-10-CM | POA: Diagnosis not present

## 2019-07-17 DIAGNOSIS — D509 Iron deficiency anemia, unspecified: Secondary | ICD-10-CM | POA: Diagnosis not present

## 2019-07-17 DIAGNOSIS — H548 Legal blindness, as defined in USA: Secondary | ICD-10-CM | POA: Diagnosis not present

## 2019-07-17 DIAGNOSIS — I130 Hypertensive heart and chronic kidney disease with heart failure and stage 1 through stage 4 chronic kidney disease, or unspecified chronic kidney disease: Secondary | ICD-10-CM | POA: Diagnosis not present

## 2019-07-17 DIAGNOSIS — N3289 Other specified disorders of bladder: Secondary | ICD-10-CM | POA: Diagnosis not present

## 2019-07-17 DIAGNOSIS — J449 Chronic obstructive pulmonary disease, unspecified: Secondary | ICD-10-CM | POA: Diagnosis not present

## 2019-07-17 DIAGNOSIS — L03115 Cellulitis of right lower limb: Secondary | ICD-10-CM | POA: Diagnosis not present

## 2019-07-25 DIAGNOSIS — Z96651 Presence of right artificial knee joint: Secondary | ICD-10-CM | POA: Diagnosis not present

## 2019-07-25 DIAGNOSIS — Z7901 Long term (current) use of anticoagulants: Secondary | ICD-10-CM | POA: Diagnosis not present

## 2019-07-25 DIAGNOSIS — I130 Hypertensive heart and chronic kidney disease with heart failure and stage 1 through stage 4 chronic kidney disease, or unspecified chronic kidney disease: Secondary | ICD-10-CM | POA: Diagnosis not present

## 2019-07-25 DIAGNOSIS — D509 Iron deficiency anemia, unspecified: Secondary | ICD-10-CM | POA: Diagnosis not present

## 2019-07-25 DIAGNOSIS — N183 Chronic kidney disease, stage 3 (moderate): Secondary | ICD-10-CM | POA: Diagnosis not present

## 2019-07-25 DIAGNOSIS — L03116 Cellulitis of left lower limb: Secondary | ICD-10-CM | POA: Diagnosis not present

## 2019-07-25 DIAGNOSIS — M5442 Lumbago with sciatica, left side: Secondary | ICD-10-CM | POA: Diagnosis not present

## 2019-07-25 DIAGNOSIS — I700 Atherosclerosis of aorta: Secondary | ICD-10-CM | POA: Diagnosis not present

## 2019-07-25 DIAGNOSIS — I4510 Unspecified right bundle-branch block: Secondary | ICD-10-CM | POA: Diagnosis not present

## 2019-07-25 DIAGNOSIS — H548 Legal blindness, as defined in USA: Secondary | ICD-10-CM | POA: Diagnosis not present

## 2019-07-25 DIAGNOSIS — J329 Chronic sinusitis, unspecified: Secondary | ICD-10-CM | POA: Diagnosis not present

## 2019-07-25 DIAGNOSIS — H3530 Unspecified macular degeneration: Secondary | ICD-10-CM | POA: Diagnosis not present

## 2019-07-25 DIAGNOSIS — L03115 Cellulitis of right lower limb: Secondary | ICD-10-CM | POA: Diagnosis not present

## 2019-07-25 DIAGNOSIS — Z79899 Other long term (current) drug therapy: Secondary | ICD-10-CM | POA: Diagnosis not present

## 2019-07-25 DIAGNOSIS — I5032 Chronic diastolic (congestive) heart failure: Secondary | ICD-10-CM | POA: Diagnosis not present

## 2019-07-25 DIAGNOSIS — E785 Hyperlipidemia, unspecified: Secondary | ICD-10-CM | POA: Diagnosis not present

## 2019-07-25 DIAGNOSIS — J449 Chronic obstructive pulmonary disease, unspecified: Secondary | ICD-10-CM | POA: Diagnosis not present

## 2019-07-25 DIAGNOSIS — N3281 Overactive bladder: Secondary | ICD-10-CM | POA: Diagnosis not present

## 2019-07-25 DIAGNOSIS — N3289 Other specified disorders of bladder: Secondary | ICD-10-CM | POA: Diagnosis not present

## 2019-07-25 DIAGNOSIS — I4891 Unspecified atrial fibrillation: Secondary | ICD-10-CM | POA: Diagnosis not present

## 2019-08-11 DIAGNOSIS — L03115 Cellulitis of right lower limb: Secondary | ICD-10-CM | POA: Diagnosis not present

## 2019-08-11 DIAGNOSIS — D509 Iron deficiency anemia, unspecified: Secondary | ICD-10-CM | POA: Diagnosis not present

## 2019-08-11 DIAGNOSIS — I4891 Unspecified atrial fibrillation: Secondary | ICD-10-CM | POA: Diagnosis not present

## 2019-08-11 DIAGNOSIS — J449 Chronic obstructive pulmonary disease, unspecified: Secondary | ICD-10-CM | POA: Diagnosis not present

## 2019-08-11 DIAGNOSIS — H3530 Unspecified macular degeneration: Secondary | ICD-10-CM | POA: Diagnosis not present

## 2019-08-11 DIAGNOSIS — N3289 Other specified disorders of bladder: Secondary | ICD-10-CM | POA: Diagnosis not present

## 2019-08-11 DIAGNOSIS — L03116 Cellulitis of left lower limb: Secondary | ICD-10-CM | POA: Diagnosis not present

## 2019-08-11 DIAGNOSIS — I130 Hypertensive heart and chronic kidney disease with heart failure and stage 1 through stage 4 chronic kidney disease, or unspecified chronic kidney disease: Secondary | ICD-10-CM | POA: Diagnosis not present

## 2019-08-11 DIAGNOSIS — Z79899 Other long term (current) drug therapy: Secondary | ICD-10-CM | POA: Diagnosis not present

## 2019-08-11 DIAGNOSIS — Z96651 Presence of right artificial knee joint: Secondary | ICD-10-CM | POA: Diagnosis not present

## 2019-08-11 DIAGNOSIS — Z7901 Long term (current) use of anticoagulants: Secondary | ICD-10-CM | POA: Diagnosis not present

## 2019-08-11 DIAGNOSIS — J329 Chronic sinusitis, unspecified: Secondary | ICD-10-CM | POA: Diagnosis not present

## 2019-08-11 DIAGNOSIS — I5032 Chronic diastolic (congestive) heart failure: Secondary | ICD-10-CM | POA: Diagnosis not present

## 2019-08-11 DIAGNOSIS — H548 Legal blindness, as defined in USA: Secondary | ICD-10-CM | POA: Diagnosis not present

## 2019-08-11 DIAGNOSIS — N183 Chronic kidney disease, stage 3 (moderate): Secondary | ICD-10-CM | POA: Diagnosis not present

## 2019-08-11 DIAGNOSIS — E785 Hyperlipidemia, unspecified: Secondary | ICD-10-CM | POA: Diagnosis not present

## 2019-08-11 DIAGNOSIS — N3281 Overactive bladder: Secondary | ICD-10-CM | POA: Diagnosis not present

## 2019-08-11 DIAGNOSIS — I4510 Unspecified right bundle-branch block: Secondary | ICD-10-CM | POA: Diagnosis not present

## 2019-08-11 DIAGNOSIS — M5442 Lumbago with sciatica, left side: Secondary | ICD-10-CM | POA: Diagnosis not present

## 2019-08-11 DIAGNOSIS — I700 Atherosclerosis of aorta: Secondary | ICD-10-CM | POA: Diagnosis not present

## 2019-08-21 DIAGNOSIS — D509 Iron deficiency anemia, unspecified: Secondary | ICD-10-CM | POA: Diagnosis not present

## 2019-08-21 DIAGNOSIS — I130 Hypertensive heart and chronic kidney disease with heart failure and stage 1 through stage 4 chronic kidney disease, or unspecified chronic kidney disease: Secondary | ICD-10-CM | POA: Diagnosis not present

## 2019-08-21 DIAGNOSIS — E785 Hyperlipidemia, unspecified: Secondary | ICD-10-CM | POA: Diagnosis not present

## 2019-08-21 DIAGNOSIS — Z79899 Other long term (current) drug therapy: Secondary | ICD-10-CM | POA: Diagnosis not present

## 2019-08-21 DIAGNOSIS — L03116 Cellulitis of left lower limb: Secondary | ICD-10-CM | POA: Diagnosis not present

## 2019-08-21 DIAGNOSIS — N3281 Overactive bladder: Secondary | ICD-10-CM | POA: Diagnosis not present

## 2019-08-21 DIAGNOSIS — J329 Chronic sinusitis, unspecified: Secondary | ICD-10-CM | POA: Diagnosis not present

## 2019-08-21 DIAGNOSIS — J449 Chronic obstructive pulmonary disease, unspecified: Secondary | ICD-10-CM | POA: Diagnosis not present

## 2019-08-21 DIAGNOSIS — Z96651 Presence of right artificial knee joint: Secondary | ICD-10-CM | POA: Diagnosis not present

## 2019-08-21 DIAGNOSIS — H3530 Unspecified macular degeneration: Secondary | ICD-10-CM | POA: Diagnosis not present

## 2019-08-21 DIAGNOSIS — I5032 Chronic diastolic (congestive) heart failure: Secondary | ICD-10-CM | POA: Diagnosis not present

## 2019-08-21 DIAGNOSIS — N3289 Other specified disorders of bladder: Secondary | ICD-10-CM | POA: Diagnosis not present

## 2019-08-21 DIAGNOSIS — Z7901 Long term (current) use of anticoagulants: Secondary | ICD-10-CM | POA: Diagnosis not present

## 2019-08-21 DIAGNOSIS — H548 Legal blindness, as defined in USA: Secondary | ICD-10-CM | POA: Diagnosis not present

## 2019-08-21 DIAGNOSIS — L03115 Cellulitis of right lower limb: Secondary | ICD-10-CM | POA: Diagnosis not present

## 2019-08-21 DIAGNOSIS — N1832 Chronic kidney disease, stage 3b: Secondary | ICD-10-CM | POA: Diagnosis not present

## 2019-08-21 DIAGNOSIS — I700 Atherosclerosis of aorta: Secondary | ICD-10-CM | POA: Diagnosis not present

## 2019-08-21 DIAGNOSIS — I4510 Unspecified right bundle-branch block: Secondary | ICD-10-CM | POA: Diagnosis not present

## 2019-08-21 DIAGNOSIS — M5442 Lumbago with sciatica, left side: Secondary | ICD-10-CM | POA: Diagnosis not present

## 2019-08-21 DIAGNOSIS — I4891 Unspecified atrial fibrillation: Secondary | ICD-10-CM | POA: Diagnosis not present

## 2019-09-06 DIAGNOSIS — S81801A Unspecified open wound, right lower leg, initial encounter: Secondary | ICD-10-CM | POA: Diagnosis not present

## 2019-09-08 DIAGNOSIS — Z23 Encounter for immunization: Secondary | ICD-10-CM | POA: Diagnosis not present

## 2019-09-08 DIAGNOSIS — S81801D Unspecified open wound, right lower leg, subsequent encounter: Secondary | ICD-10-CM | POA: Diagnosis not present

## 2019-09-08 DIAGNOSIS — L03115 Cellulitis of right lower limb: Secondary | ICD-10-CM | POA: Diagnosis not present

## 2019-11-16 DIAGNOSIS — S80811A Abrasion, right lower leg, initial encounter: Secondary | ICD-10-CM | POA: Diagnosis not present

## 2019-11-22 DIAGNOSIS — L0390 Cellulitis, unspecified: Secondary | ICD-10-CM | POA: Diagnosis not present

## 2019-11-27 DIAGNOSIS — L03115 Cellulitis of right lower limb: Secondary | ICD-10-CM | POA: Diagnosis not present

## 2019-11-27 DIAGNOSIS — S81801D Unspecified open wound, right lower leg, subsequent encounter: Secondary | ICD-10-CM | POA: Diagnosis not present

## 2019-12-18 ENCOUNTER — Ambulatory Visit: Payer: Medicare Other | Admitting: Cardiology

## 2020-02-06 DIAGNOSIS — I5032 Chronic diastolic (congestive) heart failure: Secondary | ICD-10-CM | POA: Diagnosis not present

## 2020-02-06 DIAGNOSIS — D509 Iron deficiency anemia, unspecified: Secondary | ICD-10-CM | POA: Diagnosis not present

## 2020-02-06 DIAGNOSIS — E785 Hyperlipidemia, unspecified: Secondary | ICD-10-CM | POA: Diagnosis not present

## 2020-02-06 DIAGNOSIS — I129 Hypertensive chronic kidney disease with stage 1 through stage 4 chronic kidney disease, or unspecified chronic kidney disease: Secondary | ICD-10-CM | POA: Diagnosis not present

## 2020-02-06 DIAGNOSIS — I4891 Unspecified atrial fibrillation: Secondary | ICD-10-CM | POA: Diagnosis not present

## 2020-02-08 DIAGNOSIS — I482 Chronic atrial fibrillation, unspecified: Secondary | ICD-10-CM | POA: Diagnosis not present

## 2020-02-08 DIAGNOSIS — I4510 Unspecified right bundle-branch block: Secondary | ICD-10-CM | POA: Diagnosis not present

## 2020-02-08 DIAGNOSIS — M5442 Lumbago with sciatica, left side: Secondary | ICD-10-CM | POA: Diagnosis not present

## 2020-02-08 DIAGNOSIS — Z96651 Presence of right artificial knee joint: Secondary | ICD-10-CM | POA: Diagnosis not present

## 2020-02-08 DIAGNOSIS — I5032 Chronic diastolic (congestive) heart failure: Secondary | ICD-10-CM | POA: Diagnosis not present

## 2020-02-08 DIAGNOSIS — I130 Hypertensive heart and chronic kidney disease with heart failure and stage 1 through stage 4 chronic kidney disease, or unspecified chronic kidney disease: Secondary | ICD-10-CM | POA: Diagnosis not present

## 2020-02-08 DIAGNOSIS — Z9181 History of falling: Secondary | ICD-10-CM | POA: Diagnosis not present

## 2020-02-08 DIAGNOSIS — H3530 Unspecified macular degeneration: Secondary | ICD-10-CM | POA: Diagnosis not present

## 2020-02-08 DIAGNOSIS — N183 Chronic kidney disease, stage 3 unspecified: Secondary | ICD-10-CM | POA: Diagnosis not present

## 2020-02-08 DIAGNOSIS — E785 Hyperlipidemia, unspecified: Secondary | ICD-10-CM | POA: Diagnosis not present

## 2020-02-08 DIAGNOSIS — Z7901 Long term (current) use of anticoagulants: Secondary | ICD-10-CM | POA: Diagnosis not present

## 2020-02-08 DIAGNOSIS — J449 Chronic obstructive pulmonary disease, unspecified: Secondary | ICD-10-CM | POA: Diagnosis not present

## 2020-02-08 DIAGNOSIS — L03115 Cellulitis of right lower limb: Secondary | ICD-10-CM | POA: Diagnosis not present

## 2020-02-08 DIAGNOSIS — S81811D Laceration without foreign body, right lower leg, subsequent encounter: Secondary | ICD-10-CM | POA: Diagnosis not present

## 2020-02-08 DIAGNOSIS — N3281 Overactive bladder: Secondary | ICD-10-CM | POA: Diagnosis not present

## 2020-02-08 DIAGNOSIS — H543 Unqualified visual loss, both eyes: Secondary | ICD-10-CM | POA: Diagnosis not present

## 2020-02-08 DIAGNOSIS — I700 Atherosclerosis of aorta: Secondary | ICD-10-CM | POA: Diagnosis not present

## 2020-02-08 DIAGNOSIS — J329 Chronic sinusitis, unspecified: Secondary | ICD-10-CM | POA: Diagnosis not present

## 2020-02-08 DIAGNOSIS — D509 Iron deficiency anemia, unspecified: Secondary | ICD-10-CM | POA: Diagnosis not present

## 2020-02-09 DIAGNOSIS — Z9181 History of falling: Secondary | ICD-10-CM | POA: Diagnosis not present

## 2020-02-09 DIAGNOSIS — L03115 Cellulitis of right lower limb: Secondary | ICD-10-CM | POA: Diagnosis not present

## 2020-02-09 DIAGNOSIS — M5442 Lumbago with sciatica, left side: Secondary | ICD-10-CM | POA: Diagnosis not present

## 2020-02-09 DIAGNOSIS — J329 Chronic sinusitis, unspecified: Secondary | ICD-10-CM | POA: Diagnosis not present

## 2020-02-09 DIAGNOSIS — J449 Chronic obstructive pulmonary disease, unspecified: Secondary | ICD-10-CM | POA: Diagnosis not present

## 2020-02-09 DIAGNOSIS — E785 Hyperlipidemia, unspecified: Secondary | ICD-10-CM | POA: Diagnosis not present

## 2020-02-09 DIAGNOSIS — D509 Iron deficiency anemia, unspecified: Secondary | ICD-10-CM | POA: Diagnosis not present

## 2020-02-09 DIAGNOSIS — I130 Hypertensive heart and chronic kidney disease with heart failure and stage 1 through stage 4 chronic kidney disease, or unspecified chronic kidney disease: Secondary | ICD-10-CM | POA: Diagnosis not present

## 2020-02-09 DIAGNOSIS — N3281 Overactive bladder: Secondary | ICD-10-CM | POA: Diagnosis not present

## 2020-02-09 DIAGNOSIS — I482 Chronic atrial fibrillation, unspecified: Secondary | ICD-10-CM | POA: Diagnosis not present

## 2020-02-09 DIAGNOSIS — H543 Unqualified visual loss, both eyes: Secondary | ICD-10-CM | POA: Diagnosis not present

## 2020-02-09 DIAGNOSIS — Z7901 Long term (current) use of anticoagulants: Secondary | ICD-10-CM | POA: Diagnosis not present

## 2020-02-09 DIAGNOSIS — S81811D Laceration without foreign body, right lower leg, subsequent encounter: Secondary | ICD-10-CM | POA: Diagnosis not present

## 2020-02-09 DIAGNOSIS — I700 Atherosclerosis of aorta: Secondary | ICD-10-CM | POA: Diagnosis not present

## 2020-02-09 DIAGNOSIS — N183 Chronic kidney disease, stage 3 unspecified: Secondary | ICD-10-CM | POA: Diagnosis not present

## 2020-02-09 DIAGNOSIS — I4510 Unspecified right bundle-branch block: Secondary | ICD-10-CM | POA: Diagnosis not present

## 2020-02-09 DIAGNOSIS — Z96651 Presence of right artificial knee joint: Secondary | ICD-10-CM | POA: Diagnosis not present

## 2020-02-09 DIAGNOSIS — I5032 Chronic diastolic (congestive) heart failure: Secondary | ICD-10-CM | POA: Diagnosis not present

## 2020-02-09 DIAGNOSIS — H3530 Unspecified macular degeneration: Secondary | ICD-10-CM | POA: Diagnosis not present

## 2020-02-15 DIAGNOSIS — H543 Unqualified visual loss, both eyes: Secondary | ICD-10-CM | POA: Diagnosis not present

## 2020-02-15 DIAGNOSIS — S81811D Laceration without foreign body, right lower leg, subsequent encounter: Secondary | ICD-10-CM | POA: Diagnosis not present

## 2020-02-15 DIAGNOSIS — N183 Chronic kidney disease, stage 3 unspecified: Secondary | ICD-10-CM | POA: Diagnosis not present

## 2020-02-15 DIAGNOSIS — Z9181 History of falling: Secondary | ICD-10-CM | POA: Diagnosis not present

## 2020-02-15 DIAGNOSIS — Z96651 Presence of right artificial knee joint: Secondary | ICD-10-CM | POA: Diagnosis not present

## 2020-02-15 DIAGNOSIS — I482 Chronic atrial fibrillation, unspecified: Secondary | ICD-10-CM | POA: Diagnosis not present

## 2020-02-15 DIAGNOSIS — J449 Chronic obstructive pulmonary disease, unspecified: Secondary | ICD-10-CM | POA: Diagnosis not present

## 2020-02-15 DIAGNOSIS — N3281 Overactive bladder: Secondary | ICD-10-CM | POA: Diagnosis not present

## 2020-02-15 DIAGNOSIS — I4510 Unspecified right bundle-branch block: Secondary | ICD-10-CM | POA: Diagnosis not present

## 2020-02-15 DIAGNOSIS — Z7901 Long term (current) use of anticoagulants: Secondary | ICD-10-CM | POA: Diagnosis not present

## 2020-02-15 DIAGNOSIS — E785 Hyperlipidemia, unspecified: Secondary | ICD-10-CM | POA: Diagnosis not present

## 2020-02-15 DIAGNOSIS — H3530 Unspecified macular degeneration: Secondary | ICD-10-CM | POA: Diagnosis not present

## 2020-02-15 DIAGNOSIS — L03115 Cellulitis of right lower limb: Secondary | ICD-10-CM | POA: Diagnosis not present

## 2020-02-15 DIAGNOSIS — D509 Iron deficiency anemia, unspecified: Secondary | ICD-10-CM | POA: Diagnosis not present

## 2020-02-15 DIAGNOSIS — J329 Chronic sinusitis, unspecified: Secondary | ICD-10-CM | POA: Diagnosis not present

## 2020-02-15 DIAGNOSIS — M5442 Lumbago with sciatica, left side: Secondary | ICD-10-CM | POA: Diagnosis not present

## 2020-02-15 DIAGNOSIS — I130 Hypertensive heart and chronic kidney disease with heart failure and stage 1 through stage 4 chronic kidney disease, or unspecified chronic kidney disease: Secondary | ICD-10-CM | POA: Diagnosis not present

## 2020-02-15 DIAGNOSIS — I700 Atherosclerosis of aorta: Secondary | ICD-10-CM | POA: Diagnosis not present

## 2020-02-15 DIAGNOSIS — I5032 Chronic diastolic (congestive) heart failure: Secondary | ICD-10-CM | POA: Diagnosis not present

## 2020-02-21 DIAGNOSIS — L03115 Cellulitis of right lower limb: Secondary | ICD-10-CM | POA: Diagnosis not present

## 2020-02-21 DIAGNOSIS — I4510 Unspecified right bundle-branch block: Secondary | ICD-10-CM | POA: Diagnosis not present

## 2020-02-21 DIAGNOSIS — Z7901 Long term (current) use of anticoagulants: Secondary | ICD-10-CM | POA: Diagnosis not present

## 2020-02-21 DIAGNOSIS — I5032 Chronic diastolic (congestive) heart failure: Secondary | ICD-10-CM | POA: Diagnosis not present

## 2020-02-21 DIAGNOSIS — M5442 Lumbago with sciatica, left side: Secondary | ICD-10-CM | POA: Diagnosis not present

## 2020-02-21 DIAGNOSIS — Z9181 History of falling: Secondary | ICD-10-CM | POA: Diagnosis not present

## 2020-02-21 DIAGNOSIS — I482 Chronic atrial fibrillation, unspecified: Secondary | ICD-10-CM | POA: Diagnosis not present

## 2020-02-21 DIAGNOSIS — E785 Hyperlipidemia, unspecified: Secondary | ICD-10-CM | POA: Diagnosis not present

## 2020-02-21 DIAGNOSIS — D509 Iron deficiency anemia, unspecified: Secondary | ICD-10-CM | POA: Diagnosis not present

## 2020-02-21 DIAGNOSIS — I130 Hypertensive heart and chronic kidney disease with heart failure and stage 1 through stage 4 chronic kidney disease, or unspecified chronic kidney disease: Secondary | ICD-10-CM | POA: Diagnosis not present

## 2020-02-21 DIAGNOSIS — Z96651 Presence of right artificial knee joint: Secondary | ICD-10-CM | POA: Diagnosis not present

## 2020-02-21 DIAGNOSIS — H543 Unqualified visual loss, both eyes: Secondary | ICD-10-CM | POA: Diagnosis not present

## 2020-02-21 DIAGNOSIS — N3281 Overactive bladder: Secondary | ICD-10-CM | POA: Diagnosis not present

## 2020-02-21 DIAGNOSIS — J449 Chronic obstructive pulmonary disease, unspecified: Secondary | ICD-10-CM | POA: Diagnosis not present

## 2020-02-21 DIAGNOSIS — J329 Chronic sinusitis, unspecified: Secondary | ICD-10-CM | POA: Diagnosis not present

## 2020-02-21 DIAGNOSIS — S81811D Laceration without foreign body, right lower leg, subsequent encounter: Secondary | ICD-10-CM | POA: Diagnosis not present

## 2020-02-21 DIAGNOSIS — N183 Chronic kidney disease, stage 3 unspecified: Secondary | ICD-10-CM | POA: Diagnosis not present

## 2020-02-21 DIAGNOSIS — H3530 Unspecified macular degeneration: Secondary | ICD-10-CM | POA: Diagnosis not present

## 2020-02-21 DIAGNOSIS — I700 Atherosclerosis of aorta: Secondary | ICD-10-CM | POA: Diagnosis not present

## 2020-02-26 DIAGNOSIS — M5442 Lumbago with sciatica, left side: Secondary | ICD-10-CM | POA: Diagnosis not present

## 2020-02-26 DIAGNOSIS — I700 Atherosclerosis of aorta: Secondary | ICD-10-CM | POA: Diagnosis not present

## 2020-02-26 DIAGNOSIS — Z96651 Presence of right artificial knee joint: Secondary | ICD-10-CM | POA: Diagnosis not present

## 2020-02-26 DIAGNOSIS — Z9181 History of falling: Secondary | ICD-10-CM | POA: Diagnosis not present

## 2020-02-26 DIAGNOSIS — Z7901 Long term (current) use of anticoagulants: Secondary | ICD-10-CM | POA: Diagnosis not present

## 2020-02-26 DIAGNOSIS — I4510 Unspecified right bundle-branch block: Secondary | ICD-10-CM | POA: Diagnosis not present

## 2020-02-26 DIAGNOSIS — E785 Hyperlipidemia, unspecified: Secondary | ICD-10-CM | POA: Diagnosis not present

## 2020-02-26 DIAGNOSIS — N183 Chronic kidney disease, stage 3 unspecified: Secondary | ICD-10-CM | POA: Diagnosis not present

## 2020-02-26 DIAGNOSIS — L03115 Cellulitis of right lower limb: Secondary | ICD-10-CM | POA: Diagnosis not present

## 2020-02-26 DIAGNOSIS — J449 Chronic obstructive pulmonary disease, unspecified: Secondary | ICD-10-CM | POA: Diagnosis not present

## 2020-02-26 DIAGNOSIS — J329 Chronic sinusitis, unspecified: Secondary | ICD-10-CM | POA: Diagnosis not present

## 2020-02-26 DIAGNOSIS — D509 Iron deficiency anemia, unspecified: Secondary | ICD-10-CM | POA: Diagnosis not present

## 2020-02-26 DIAGNOSIS — H3530 Unspecified macular degeneration: Secondary | ICD-10-CM | POA: Diagnosis not present

## 2020-02-26 DIAGNOSIS — I482 Chronic atrial fibrillation, unspecified: Secondary | ICD-10-CM | POA: Diagnosis not present

## 2020-02-26 DIAGNOSIS — H543 Unqualified visual loss, both eyes: Secondary | ICD-10-CM | POA: Diagnosis not present

## 2020-02-26 DIAGNOSIS — N3281 Overactive bladder: Secondary | ICD-10-CM | POA: Diagnosis not present

## 2020-02-26 DIAGNOSIS — I130 Hypertensive heart and chronic kidney disease with heart failure and stage 1 through stage 4 chronic kidney disease, or unspecified chronic kidney disease: Secondary | ICD-10-CM | POA: Diagnosis not present

## 2020-02-26 DIAGNOSIS — S81811D Laceration without foreign body, right lower leg, subsequent encounter: Secondary | ICD-10-CM | POA: Diagnosis not present

## 2020-02-26 DIAGNOSIS — I5032 Chronic diastolic (congestive) heart failure: Secondary | ICD-10-CM | POA: Diagnosis not present

## 2020-02-28 DIAGNOSIS — Z96651 Presence of right artificial knee joint: Secondary | ICD-10-CM | POA: Diagnosis not present

## 2020-02-28 DIAGNOSIS — Z7901 Long term (current) use of anticoagulants: Secondary | ICD-10-CM | POA: Diagnosis not present

## 2020-02-28 DIAGNOSIS — J329 Chronic sinusitis, unspecified: Secondary | ICD-10-CM | POA: Diagnosis not present

## 2020-02-28 DIAGNOSIS — S81811D Laceration without foreign body, right lower leg, subsequent encounter: Secondary | ICD-10-CM | POA: Diagnosis not present

## 2020-02-28 DIAGNOSIS — I130 Hypertensive heart and chronic kidney disease with heart failure and stage 1 through stage 4 chronic kidney disease, or unspecified chronic kidney disease: Secondary | ICD-10-CM | POA: Diagnosis not present

## 2020-02-28 DIAGNOSIS — H543 Unqualified visual loss, both eyes: Secondary | ICD-10-CM | POA: Diagnosis not present

## 2020-02-28 DIAGNOSIS — M5442 Lumbago with sciatica, left side: Secondary | ICD-10-CM | POA: Diagnosis not present

## 2020-02-28 DIAGNOSIS — Z9181 History of falling: Secondary | ICD-10-CM | POA: Diagnosis not present

## 2020-02-28 DIAGNOSIS — L03115 Cellulitis of right lower limb: Secondary | ICD-10-CM | POA: Diagnosis not present

## 2020-02-28 DIAGNOSIS — I482 Chronic atrial fibrillation, unspecified: Secondary | ICD-10-CM | POA: Diagnosis not present

## 2020-02-28 DIAGNOSIS — I4510 Unspecified right bundle-branch block: Secondary | ICD-10-CM | POA: Diagnosis not present

## 2020-02-28 DIAGNOSIS — N3281 Overactive bladder: Secondary | ICD-10-CM | POA: Diagnosis not present

## 2020-02-28 DIAGNOSIS — N183 Chronic kidney disease, stage 3 unspecified: Secondary | ICD-10-CM | POA: Diagnosis not present

## 2020-02-28 DIAGNOSIS — I700 Atherosclerosis of aorta: Secondary | ICD-10-CM | POA: Diagnosis not present

## 2020-02-28 DIAGNOSIS — I5032 Chronic diastolic (congestive) heart failure: Secondary | ICD-10-CM | POA: Diagnosis not present

## 2020-02-28 DIAGNOSIS — E785 Hyperlipidemia, unspecified: Secondary | ICD-10-CM | POA: Diagnosis not present

## 2020-02-28 DIAGNOSIS — H3530 Unspecified macular degeneration: Secondary | ICD-10-CM | POA: Diagnosis not present

## 2020-02-28 DIAGNOSIS — D509 Iron deficiency anemia, unspecified: Secondary | ICD-10-CM | POA: Diagnosis not present

## 2020-02-28 DIAGNOSIS — J449 Chronic obstructive pulmonary disease, unspecified: Secondary | ICD-10-CM | POA: Diagnosis not present

## 2020-03-04 DIAGNOSIS — M5442 Lumbago with sciatica, left side: Secondary | ICD-10-CM | POA: Diagnosis not present

## 2020-03-04 DIAGNOSIS — D509 Iron deficiency anemia, unspecified: Secondary | ICD-10-CM | POA: Diagnosis not present

## 2020-03-04 DIAGNOSIS — S81811D Laceration without foreign body, right lower leg, subsequent encounter: Secondary | ICD-10-CM | POA: Diagnosis not present

## 2020-03-04 DIAGNOSIS — I700 Atherosclerosis of aorta: Secondary | ICD-10-CM | POA: Diagnosis not present

## 2020-03-04 DIAGNOSIS — I4510 Unspecified right bundle-branch block: Secondary | ICD-10-CM | POA: Diagnosis not present

## 2020-03-04 DIAGNOSIS — J329 Chronic sinusitis, unspecified: Secondary | ICD-10-CM | POA: Diagnosis not present

## 2020-03-04 DIAGNOSIS — I482 Chronic atrial fibrillation, unspecified: Secondary | ICD-10-CM | POA: Diagnosis not present

## 2020-03-04 DIAGNOSIS — N3281 Overactive bladder: Secondary | ICD-10-CM | POA: Diagnosis not present

## 2020-03-04 DIAGNOSIS — Z9181 History of falling: Secondary | ICD-10-CM | POA: Diagnosis not present

## 2020-03-04 DIAGNOSIS — I5032 Chronic diastolic (congestive) heart failure: Secondary | ICD-10-CM | POA: Diagnosis not present

## 2020-03-04 DIAGNOSIS — N183 Chronic kidney disease, stage 3 unspecified: Secondary | ICD-10-CM | POA: Diagnosis not present

## 2020-03-04 DIAGNOSIS — Z96651 Presence of right artificial knee joint: Secondary | ICD-10-CM | POA: Diagnosis not present

## 2020-03-04 DIAGNOSIS — J449 Chronic obstructive pulmonary disease, unspecified: Secondary | ICD-10-CM | POA: Diagnosis not present

## 2020-03-04 DIAGNOSIS — L03115 Cellulitis of right lower limb: Secondary | ICD-10-CM | POA: Diagnosis not present

## 2020-03-04 DIAGNOSIS — I130 Hypertensive heart and chronic kidney disease with heart failure and stage 1 through stage 4 chronic kidney disease, or unspecified chronic kidney disease: Secondary | ICD-10-CM | POA: Diagnosis not present

## 2020-03-04 DIAGNOSIS — E785 Hyperlipidemia, unspecified: Secondary | ICD-10-CM | POA: Diagnosis not present

## 2020-03-04 DIAGNOSIS — H3530 Unspecified macular degeneration: Secondary | ICD-10-CM | POA: Diagnosis not present

## 2020-03-04 DIAGNOSIS — Z7901 Long term (current) use of anticoagulants: Secondary | ICD-10-CM | POA: Diagnosis not present

## 2020-03-04 DIAGNOSIS — H543 Unqualified visual loss, both eyes: Secondary | ICD-10-CM | POA: Diagnosis not present

## 2020-03-06 DIAGNOSIS — Z7901 Long term (current) use of anticoagulants: Secondary | ICD-10-CM | POA: Diagnosis not present

## 2020-03-06 DIAGNOSIS — L03115 Cellulitis of right lower limb: Secondary | ICD-10-CM | POA: Diagnosis not present

## 2020-03-06 DIAGNOSIS — Z96651 Presence of right artificial knee joint: Secondary | ICD-10-CM | POA: Diagnosis not present

## 2020-03-06 DIAGNOSIS — J449 Chronic obstructive pulmonary disease, unspecified: Secondary | ICD-10-CM | POA: Diagnosis not present

## 2020-03-06 DIAGNOSIS — H3530 Unspecified macular degeneration: Secondary | ICD-10-CM | POA: Diagnosis not present

## 2020-03-06 DIAGNOSIS — Z9181 History of falling: Secondary | ICD-10-CM | POA: Diagnosis not present

## 2020-03-06 DIAGNOSIS — E785 Hyperlipidemia, unspecified: Secondary | ICD-10-CM | POA: Diagnosis not present

## 2020-03-06 DIAGNOSIS — N183 Chronic kidney disease, stage 3 unspecified: Secondary | ICD-10-CM | POA: Diagnosis not present

## 2020-03-06 DIAGNOSIS — M5442 Lumbago with sciatica, left side: Secondary | ICD-10-CM | POA: Diagnosis not present

## 2020-03-06 DIAGNOSIS — I700 Atherosclerosis of aorta: Secondary | ICD-10-CM | POA: Diagnosis not present

## 2020-03-06 DIAGNOSIS — I482 Chronic atrial fibrillation, unspecified: Secondary | ICD-10-CM | POA: Diagnosis not present

## 2020-03-06 DIAGNOSIS — N3281 Overactive bladder: Secondary | ICD-10-CM | POA: Diagnosis not present

## 2020-03-06 DIAGNOSIS — I5032 Chronic diastolic (congestive) heart failure: Secondary | ICD-10-CM | POA: Diagnosis not present

## 2020-03-06 DIAGNOSIS — I130 Hypertensive heart and chronic kidney disease with heart failure and stage 1 through stage 4 chronic kidney disease, or unspecified chronic kidney disease: Secondary | ICD-10-CM | POA: Diagnosis not present

## 2020-03-06 DIAGNOSIS — I4510 Unspecified right bundle-branch block: Secondary | ICD-10-CM | POA: Diagnosis not present

## 2020-03-06 DIAGNOSIS — S81811D Laceration without foreign body, right lower leg, subsequent encounter: Secondary | ICD-10-CM | POA: Diagnosis not present

## 2020-03-06 DIAGNOSIS — H543 Unqualified visual loss, both eyes: Secondary | ICD-10-CM | POA: Diagnosis not present

## 2020-03-06 DIAGNOSIS — D509 Iron deficiency anemia, unspecified: Secondary | ICD-10-CM | POA: Diagnosis not present

## 2020-03-06 DIAGNOSIS — J329 Chronic sinusitis, unspecified: Secondary | ICD-10-CM | POA: Diagnosis not present

## 2020-03-18 DIAGNOSIS — S81811D Laceration without foreign body, right lower leg, subsequent encounter: Secondary | ICD-10-CM | POA: Diagnosis not present

## 2020-03-18 DIAGNOSIS — D509 Iron deficiency anemia, unspecified: Secondary | ICD-10-CM | POA: Diagnosis not present

## 2020-03-18 DIAGNOSIS — Z96651 Presence of right artificial knee joint: Secondary | ICD-10-CM | POA: Diagnosis not present

## 2020-03-18 DIAGNOSIS — I130 Hypertensive heart and chronic kidney disease with heart failure and stage 1 through stage 4 chronic kidney disease, or unspecified chronic kidney disease: Secondary | ICD-10-CM | POA: Diagnosis not present

## 2020-03-18 DIAGNOSIS — Z7901 Long term (current) use of anticoagulants: Secondary | ICD-10-CM | POA: Diagnosis not present

## 2020-03-18 DIAGNOSIS — I700 Atherosclerosis of aorta: Secondary | ICD-10-CM | POA: Diagnosis not present

## 2020-03-18 DIAGNOSIS — H543 Unqualified visual loss, both eyes: Secondary | ICD-10-CM | POA: Diagnosis not present

## 2020-03-18 DIAGNOSIS — I5032 Chronic diastolic (congestive) heart failure: Secondary | ICD-10-CM | POA: Diagnosis not present

## 2020-03-18 DIAGNOSIS — J329 Chronic sinusitis, unspecified: Secondary | ICD-10-CM | POA: Diagnosis not present

## 2020-03-18 DIAGNOSIS — L03115 Cellulitis of right lower limb: Secondary | ICD-10-CM | POA: Diagnosis not present

## 2020-03-18 DIAGNOSIS — I4510 Unspecified right bundle-branch block: Secondary | ICD-10-CM | POA: Diagnosis not present

## 2020-03-18 DIAGNOSIS — M5442 Lumbago with sciatica, left side: Secondary | ICD-10-CM | POA: Diagnosis not present

## 2020-03-18 DIAGNOSIS — H3530 Unspecified macular degeneration: Secondary | ICD-10-CM | POA: Diagnosis not present

## 2020-03-18 DIAGNOSIS — Z9181 History of falling: Secondary | ICD-10-CM | POA: Diagnosis not present

## 2020-03-18 DIAGNOSIS — N3281 Overactive bladder: Secondary | ICD-10-CM | POA: Diagnosis not present

## 2020-03-18 DIAGNOSIS — I482 Chronic atrial fibrillation, unspecified: Secondary | ICD-10-CM | POA: Diagnosis not present

## 2020-03-18 DIAGNOSIS — E785 Hyperlipidemia, unspecified: Secondary | ICD-10-CM | POA: Diagnosis not present

## 2020-03-18 DIAGNOSIS — J449 Chronic obstructive pulmonary disease, unspecified: Secondary | ICD-10-CM | POA: Diagnosis not present

## 2020-03-18 DIAGNOSIS — N183 Chronic kidney disease, stage 3 unspecified: Secondary | ICD-10-CM | POA: Diagnosis not present

## 2020-03-20 DIAGNOSIS — S81811D Laceration without foreign body, right lower leg, subsequent encounter: Secondary | ICD-10-CM | POA: Diagnosis not present

## 2020-03-20 DIAGNOSIS — I700 Atherosclerosis of aorta: Secondary | ICD-10-CM | POA: Diagnosis not present

## 2020-03-20 DIAGNOSIS — D509 Iron deficiency anemia, unspecified: Secondary | ICD-10-CM | POA: Diagnosis not present

## 2020-03-20 DIAGNOSIS — Z7901 Long term (current) use of anticoagulants: Secondary | ICD-10-CM | POA: Diagnosis not present

## 2020-03-20 DIAGNOSIS — N183 Chronic kidney disease, stage 3 unspecified: Secondary | ICD-10-CM | POA: Diagnosis not present

## 2020-03-20 DIAGNOSIS — L03115 Cellulitis of right lower limb: Secondary | ICD-10-CM | POA: Diagnosis not present

## 2020-03-20 DIAGNOSIS — I5032 Chronic diastolic (congestive) heart failure: Secondary | ICD-10-CM | POA: Diagnosis not present

## 2020-03-20 DIAGNOSIS — J329 Chronic sinusitis, unspecified: Secondary | ICD-10-CM | POA: Diagnosis not present

## 2020-03-20 DIAGNOSIS — H3530 Unspecified macular degeneration: Secondary | ICD-10-CM | POA: Diagnosis not present

## 2020-03-20 DIAGNOSIS — Z96651 Presence of right artificial knee joint: Secondary | ICD-10-CM | POA: Diagnosis not present

## 2020-03-20 DIAGNOSIS — Z9181 History of falling: Secondary | ICD-10-CM | POA: Diagnosis not present

## 2020-03-20 DIAGNOSIS — J449 Chronic obstructive pulmonary disease, unspecified: Secondary | ICD-10-CM | POA: Diagnosis not present

## 2020-03-20 DIAGNOSIS — I4510 Unspecified right bundle-branch block: Secondary | ICD-10-CM | POA: Diagnosis not present

## 2020-03-20 DIAGNOSIS — N3281 Overactive bladder: Secondary | ICD-10-CM | POA: Diagnosis not present

## 2020-03-20 DIAGNOSIS — E785 Hyperlipidemia, unspecified: Secondary | ICD-10-CM | POA: Diagnosis not present

## 2020-03-20 DIAGNOSIS — I130 Hypertensive heart and chronic kidney disease with heart failure and stage 1 through stage 4 chronic kidney disease, or unspecified chronic kidney disease: Secondary | ICD-10-CM | POA: Diagnosis not present

## 2020-03-20 DIAGNOSIS — M5442 Lumbago with sciatica, left side: Secondary | ICD-10-CM | POA: Diagnosis not present

## 2020-03-20 DIAGNOSIS — I482 Chronic atrial fibrillation, unspecified: Secondary | ICD-10-CM | POA: Diagnosis not present

## 2020-03-20 DIAGNOSIS — H543 Unqualified visual loss, both eyes: Secondary | ICD-10-CM | POA: Diagnosis not present

## 2020-04-04 DIAGNOSIS — S81811D Laceration without foreign body, right lower leg, subsequent encounter: Secondary | ICD-10-CM | POA: Diagnosis not present

## 2020-04-04 DIAGNOSIS — I482 Chronic atrial fibrillation, unspecified: Secondary | ICD-10-CM | POA: Diagnosis not present

## 2020-04-04 DIAGNOSIS — E785 Hyperlipidemia, unspecified: Secondary | ICD-10-CM | POA: Diagnosis not present

## 2020-04-04 DIAGNOSIS — I4510 Unspecified right bundle-branch block: Secondary | ICD-10-CM | POA: Diagnosis not present

## 2020-04-04 DIAGNOSIS — M5442 Lumbago with sciatica, left side: Secondary | ICD-10-CM | POA: Diagnosis not present

## 2020-04-04 DIAGNOSIS — I130 Hypertensive heart and chronic kidney disease with heart failure and stage 1 through stage 4 chronic kidney disease, or unspecified chronic kidney disease: Secondary | ICD-10-CM | POA: Diagnosis not present

## 2020-04-04 DIAGNOSIS — N3281 Overactive bladder: Secondary | ICD-10-CM | POA: Diagnosis not present

## 2020-04-04 DIAGNOSIS — J329 Chronic sinusitis, unspecified: Secondary | ICD-10-CM | POA: Diagnosis not present

## 2020-04-04 DIAGNOSIS — I5032 Chronic diastolic (congestive) heart failure: Secondary | ICD-10-CM | POA: Diagnosis not present

## 2020-04-04 DIAGNOSIS — J449 Chronic obstructive pulmonary disease, unspecified: Secondary | ICD-10-CM | POA: Diagnosis not present

## 2020-04-04 DIAGNOSIS — Z7901 Long term (current) use of anticoagulants: Secondary | ICD-10-CM | POA: Diagnosis not present

## 2020-04-04 DIAGNOSIS — H3530 Unspecified macular degeneration: Secondary | ICD-10-CM | POA: Diagnosis not present

## 2020-04-04 DIAGNOSIS — H543 Unqualified visual loss, both eyes: Secondary | ICD-10-CM | POA: Diagnosis not present

## 2020-04-04 DIAGNOSIS — I700 Atherosclerosis of aorta: Secondary | ICD-10-CM | POA: Diagnosis not present

## 2020-04-04 DIAGNOSIS — Z9181 History of falling: Secondary | ICD-10-CM | POA: Diagnosis not present

## 2020-04-04 DIAGNOSIS — N183 Chronic kidney disease, stage 3 unspecified: Secondary | ICD-10-CM | POA: Diagnosis not present

## 2020-04-04 DIAGNOSIS — Z96651 Presence of right artificial knee joint: Secondary | ICD-10-CM | POA: Diagnosis not present

## 2020-04-04 DIAGNOSIS — L03115 Cellulitis of right lower limb: Secondary | ICD-10-CM | POA: Diagnosis not present

## 2020-04-04 DIAGNOSIS — D509 Iron deficiency anemia, unspecified: Secondary | ICD-10-CM | POA: Diagnosis not present

## 2020-04-17 DIAGNOSIS — Z139 Encounter for screening, unspecified: Secondary | ICD-10-CM | POA: Diagnosis not present

## 2020-04-17 DIAGNOSIS — Z9181 History of falling: Secondary | ICD-10-CM | POA: Diagnosis not present

## 2020-04-17 DIAGNOSIS — Z0000 Encounter for general adult medical examination without abnormal findings: Secondary | ICD-10-CM | POA: Diagnosis not present

## 2020-04-17 DIAGNOSIS — E785 Hyperlipidemia, unspecified: Secondary | ICD-10-CM | POA: Diagnosis not present

## 2020-05-02 DIAGNOSIS — I4891 Unspecified atrial fibrillation: Secondary | ICD-10-CM | POA: Diagnosis not present

## 2020-05-02 DIAGNOSIS — I5032 Chronic diastolic (congestive) heart failure: Secondary | ICD-10-CM | POA: Diagnosis not present

## 2020-05-02 DIAGNOSIS — I129 Hypertensive chronic kidney disease with stage 1 through stage 4 chronic kidney disease, or unspecified chronic kidney disease: Secondary | ICD-10-CM | POA: Diagnosis not present

## 2020-05-02 DIAGNOSIS — E785 Hyperlipidemia, unspecified: Secondary | ICD-10-CM | POA: Diagnosis not present

## 2020-05-02 DIAGNOSIS — D509 Iron deficiency anemia, unspecified: Secondary | ICD-10-CM | POA: Diagnosis not present

## 2020-05-22 DIAGNOSIS — S81802A Unspecified open wound, left lower leg, initial encounter: Secondary | ICD-10-CM | POA: Diagnosis not present

## 2020-08-02 DIAGNOSIS — D509 Iron deficiency anemia, unspecified: Secondary | ICD-10-CM | POA: Diagnosis not present

## 2020-08-02 DIAGNOSIS — I129 Hypertensive chronic kidney disease with stage 1 through stage 4 chronic kidney disease, or unspecified chronic kidney disease: Secondary | ICD-10-CM | POA: Diagnosis not present

## 2020-08-02 DIAGNOSIS — I5032 Chronic diastolic (congestive) heart failure: Secondary | ICD-10-CM | POA: Diagnosis not present

## 2020-08-02 DIAGNOSIS — I4891 Unspecified atrial fibrillation: Secondary | ICD-10-CM | POA: Diagnosis not present

## 2020-08-02 DIAGNOSIS — E785 Hyperlipidemia, unspecified: Secondary | ICD-10-CM | POA: Diagnosis not present

## 2020-08-18 ENCOUNTER — Inpatient Hospital Stay (HOSPITAL_COMMUNITY)
Admission: EM | Admit: 2020-08-18 | Discharge: 2020-08-20 | DRG: 177 | Disposition: A | Discharge status: Home Health | Payer: No Typology Code available for payment source | Attending: Internal Medicine | Admitting: Internal Medicine

## 2020-08-18 ENCOUNTER — Other Ambulatory Visit: Payer: Self-pay

## 2020-08-18 ENCOUNTER — Encounter (HOSPITAL_COMMUNITY): Payer: Self-pay | Admitting: Emergency Medicine

## 2020-08-18 ENCOUNTER — Emergency Department (HOSPITAL_COMMUNITY): Payer: No Typology Code available for payment source

## 2020-08-18 DIAGNOSIS — J1282 Pneumonia due to coronavirus disease 2019: Secondary | ICD-10-CM | POA: Diagnosis present

## 2020-08-18 DIAGNOSIS — N1831 Chronic kidney disease, stage 3a: Secondary | ICD-10-CM | POA: Diagnosis present

## 2020-08-18 DIAGNOSIS — I517 Cardiomegaly: Secondary | ICD-10-CM | POA: Diagnosis not present

## 2020-08-18 DIAGNOSIS — Z7901 Long term (current) use of anticoagulants: Secondary | ICD-10-CM | POA: Diagnosis not present

## 2020-08-18 DIAGNOSIS — I4510 Unspecified right bundle-branch block: Secondary | ICD-10-CM | POA: Diagnosis present

## 2020-08-18 DIAGNOSIS — J90 Pleural effusion, not elsewhere classified: Secondary | ICD-10-CM | POA: Diagnosis not present

## 2020-08-18 DIAGNOSIS — E785 Hyperlipidemia, unspecified: Secondary | ICD-10-CM | POA: Diagnosis present

## 2020-08-18 DIAGNOSIS — M1711 Unilateral primary osteoarthritis, right knee: Secondary | ICD-10-CM | POA: Diagnosis present

## 2020-08-18 DIAGNOSIS — U071 COVID-19: Secondary | ICD-10-CM | POA: Diagnosis present

## 2020-08-18 DIAGNOSIS — I4821 Permanent atrial fibrillation: Secondary | ICD-10-CM | POA: Diagnosis present

## 2020-08-18 DIAGNOSIS — Z8661 Personal history of infections of the central nervous system: Secondary | ICD-10-CM

## 2020-08-18 DIAGNOSIS — I130 Hypertensive heart and chronic kidney disease with heart failure and stage 1 through stage 4 chronic kidney disease, or unspecified chronic kidney disease: Secondary | ICD-10-CM | POA: Diagnosis present

## 2020-08-18 DIAGNOSIS — J9601 Acute respiratory failure with hypoxia: Secondary | ICD-10-CM | POA: Diagnosis present

## 2020-08-18 DIAGNOSIS — I5032 Chronic diastolic (congestive) heart failure: Secondary | ICD-10-CM | POA: Diagnosis present

## 2020-08-18 DIAGNOSIS — Z87891 Personal history of nicotine dependence: Secondary | ICD-10-CM | POA: Diagnosis not present

## 2020-08-18 DIAGNOSIS — Z8249 Family history of ischemic heart disease and other diseases of the circulatory system: Secondary | ICD-10-CM | POA: Diagnosis not present

## 2020-08-18 DIAGNOSIS — N189 Chronic kidney disease, unspecified: Secondary | ICD-10-CM

## 2020-08-18 DIAGNOSIS — R059 Cough, unspecified: Secondary | ICD-10-CM | POA: Diagnosis not present

## 2020-08-18 LAB — HEPATIC FUNCTION PANEL
ALT: 20 U/L (ref 0–44)
AST: 22 U/L (ref 15–41)
Albumin: 2.9 g/dL — ABNORMAL LOW (ref 3.5–5.0)
Alkaline Phosphatase: 121 U/L (ref 38–126)
Bilirubin, Direct: 0.3 mg/dL — ABNORMAL HIGH (ref 0.0–0.2)
Indirect Bilirubin: 0.9 mg/dL (ref 0.3–0.9)
Total Bilirubin: 1.2 mg/dL (ref 0.3–1.2)
Total Protein: 6.5 g/dL (ref 6.5–8.1)

## 2020-08-18 LAB — CBC
HCT: 38.3 % — ABNORMAL LOW (ref 39.0–52.0)
HCT: 37.2 % — ABNORMAL LOW (ref 39.0–52.0)
Hemoglobin: 11.7 g/dL — ABNORMAL LOW (ref 13.0–17.0)
Hemoglobin: 11.4 g/dL — ABNORMAL LOW (ref 13.0–17.0)
MCH: 28.8 pg (ref 26.0–34.0)
MCH: 28.9 pg (ref 26.0–34.0)
MCHC: 30.5 g/dL (ref 30.0–36.0)
MCHC: 30.6 g/dL (ref 30.0–36.0)
MCV: 94.3 fL (ref 80.0–100.0)
MCV: 94.4 fL (ref 80.0–100.0)
Platelets: 294 10*3/uL (ref 150–400)
Platelets: 262 10*3/uL (ref 150–400)
RBC: 4.06 MIL/uL — ABNORMAL LOW (ref 4.22–5.81)
RBC: 3.94 MIL/uL — ABNORMAL LOW (ref 4.22–5.81)
RDW: 14.5 % (ref 11.5–15.5)
RDW: 14.5 % (ref 11.5–15.5)
WBC: 8.1 10*3/uL (ref 4.0–10.5)
WBC: 7.1 10*3/uL (ref 4.0–10.5)
nRBC: 0 % (ref 0.0–0.2)
nRBC: 0 % (ref 0.0–0.2)

## 2020-08-18 LAB — D-DIMER, QUANTITATIVE: D-Dimer, Quant: 0.9 ug/mL-FEU — ABNORMAL HIGH (ref 0.00–0.50)

## 2020-08-18 LAB — LACTIC ACID, PLASMA: Lactic Acid, Venous: 1.4 mmol/L (ref 0.5–1.9)

## 2020-08-18 LAB — BASIC METABOLIC PANEL
Anion gap: 14 (ref 5–15)
BUN: 41 mg/dL — ABNORMAL HIGH (ref 8–23)
CO2: 26 mmol/L (ref 22–32)
Calcium: 8.8 mg/dL — ABNORMAL LOW (ref 8.9–10.3)
Chloride: 100 mmol/L (ref 98–111)
Creatinine, Ser: 1.65 mg/dL — ABNORMAL HIGH (ref 0.61–1.24)
GFR calc Af Amer: 41 mL/min — ABNORMAL LOW (ref >60)
GFR calc non Af Amer: 36 mL/min — ABNORMAL LOW (ref >60)
Glucose, Bld: 144 mg/dL — ABNORMAL HIGH (ref 70–99)
Potassium: 3.4 mmol/L — ABNORMAL LOW (ref 3.5–5.1)
Sodium: 140 mmol/L (ref 135–145)

## 2020-08-18 LAB — TRIGLYCERIDES: Triglycerides: 76 mg/dL (ref <150)

## 2020-08-18 LAB — RESPIRATORY PANEL BY RT PCR (FLU A&B, COVID)
Influenza A by PCR: NEGATIVE
Influenza B by PCR: NEGATIVE
SARS Coronavirus 2 by RT PCR: POSITIVE — AB

## 2020-08-18 LAB — FIBRINOGEN: Fibrinogen: 577 mg/dL — ABNORMAL HIGH (ref 210–475)

## 2020-08-18 LAB — LACTATE DEHYDROGENASE: LDH: 256 U/L — ABNORMAL HIGH (ref 98–192)

## 2020-08-18 LAB — CREATININE, SERUM
Creatinine, Ser: 1.89 mg/dL — ABNORMAL HIGH (ref 0.61–1.24)
GFR calc Af Amer: 35 mL/min — ABNORMAL LOW (ref >60)
GFR calc non Af Amer: 30 mL/min — ABNORMAL LOW (ref >60)

## 2020-08-18 LAB — PROCALCITONIN: Procalcitonin: 3.41 ng/mL

## 2020-08-18 LAB — C-REACTIVE PROTEIN: CRP: 4.3 mg/dL — ABNORMAL HIGH (ref <1.0)

## 2020-08-18 LAB — TROPONIN I (HIGH SENSITIVITY): Troponin I (High Sensitivity): 28 ng/L — ABNORMAL HIGH (ref <18)

## 2020-08-18 LAB — FERRITIN: Ferritin: 1096 ng/mL — ABNORMAL HIGH (ref 24–336)

## 2020-08-18 MED ORDER — CARBOXYMETHYLCELLULOSE SODIUM 0.5 % OP SOLN: 1.0000 [drp] | Freq: Three times a day (TID) | OPHTHALMIC | Status: DC | PRN

## 2020-08-18 MED ORDER — DILTIAZEM HCL ER COATED BEADS 120 MG PO CP24
240.0000 mg | ORAL_CAPSULE | Freq: Every day | ORAL | Status: DC
  Administered 2020-08-19 – 2020-08-20 (×2): 240 mg via ORAL
  Filled 2020-08-18: qty 1
  Filled 2020-08-18: qty 2

## 2020-08-18 MED ORDER — DORZOLAMIDE HCL-TIMOLOL MAL 2-0.5 % OP SOLN
1.0000 [drp] | Freq: Two times a day (BID) | OPHTHALMIC | Status: DC
  Administered 2020-08-19: 1 [drp] via OPHTHALMIC
  Filled 2020-08-18 (×2): qty 10

## 2020-08-18 MED ORDER — FUROSEMIDE 40 MG PO TABS
40.0000 mg | ORAL_TABLET | Freq: Every day | ORAL | Status: DC
  Administered 2020-08-19 – 2020-08-20 (×2): 40 mg via ORAL
  Filled 2020-08-18 (×2): qty 1

## 2020-08-18 MED ORDER — APIXABAN 2.5 MG PO TABS
2.5000 mg | ORAL_TABLET | Freq: Two times a day (BID) | ORAL | Status: DC
  Administered 2020-08-18 – 2020-08-20 (×4): 2.5 mg via ORAL
  Filled 2020-08-18 (×5): qty 1

## 2020-08-18 MED ORDER — FERROUS SULFATE 325 (65 FE) MG PO TABS
325.0000 mg | ORAL_TABLET | Freq: Three times a day (TID) | ORAL | Status: DC
  Administered 2020-08-19 – 2020-08-20 (×4): 325 mg via ORAL
  Filled 2020-08-18 (×4): qty 1

## 2020-08-18 MED ORDER — DEXAMETHASONE SODIUM PHOSPHATE 10 MG/ML IJ SOLN
6.0000 mg | INTRAMUSCULAR | Status: DC
  Administered 2020-08-19: 6 mg via INTRAVENOUS
  Filled 2020-08-18: qty 1

## 2020-08-18 MED ORDER — PROSIGHT PO TABS
1.0000 | ORAL_TABLET | Freq: Two times a day (BID) | ORAL | Status: DC
  Administered 2020-08-18 – 2020-08-20 (×4): 1 via ORAL
  Filled 2020-08-18 (×5): qty 1

## 2020-08-18 MED ORDER — PRAVASTATIN SODIUM 40 MG PO TABS
40.0000 mg | ORAL_TABLET | Freq: Every day | ORAL | Status: DC
  Administered 2020-08-19 – 2020-08-20 (×2): 40 mg via ORAL
  Filled 2020-08-18 (×2): qty 1

## 2020-08-18 MED ORDER — SODIUM CHLORIDE 0.9 % IV SOLN
100.0000 mg | Freq: Every day | INTRAVENOUS | Status: DC
  Administered 2020-08-19: 100 mg via INTRAVENOUS
  Filled 2020-08-18 (×3): qty 20

## 2020-08-18 MED ORDER — SODIUM CHLORIDE 0.9 % IV SOLN
200.0000 mg | Freq: Once | INTRAVENOUS | Status: AC
  Administered 2020-08-18: 200 mg via INTRAVENOUS
  Filled 2020-08-18: qty 40

## 2020-08-18 MED ORDER — DEXAMETHASONE SODIUM PHOSPHATE 10 MG/ML IJ SOLN
6.0000 mg | Freq: Once | INTRAMUSCULAR | Status: AC
  Administered 2020-08-18: 6 mg via INTRAVENOUS
  Filled 2020-08-18: qty 1

## 2020-08-18 MED ORDER — ENSURE ENLIVE PO LIQD
237.0000 mL | Freq: Two times a day (BID) | ORAL | Status: DC
  Administered 2020-08-19 – 2020-08-20 (×3): 237 mL via ORAL

## 2020-08-18 MED ORDER — CYCLOSPORINE 0.05 % OP EMUL: 1.0000 [drp] | Freq: Two times a day (BID) | OPHTHALMIC | Status: DC

## 2020-08-18 MED ORDER — POLYVINYL ALCOHOL 1.4 % OP SOLN
1.0000 [drp] | Freq: Three times a day (TID) | OPHTHALMIC | Status: DC | PRN
  Filled 2020-08-18: qty 15

## 2020-08-18 MED ORDER — KETOTIFEN FUMARATE 0.025 % OP SOLN: 1.0000 [drp] | Freq: Two times a day (BID) | OPHTHALMIC | Status: DC

## 2020-08-18 MED ORDER — BENAZEPRIL HCL 40 MG PO TABS: 40.0000 mg | ORAL_TABLET | Freq: Every day | ORAL | Status: DC

## 2020-08-18 MED ORDER — PRESERVISION AREDS 2 PO CHEW: CHEWABLE_TABLET | Freq: Two times a day (BID) | ORAL | Status: DC

## 2020-08-18 NOTE — Plan of Care (Signed)
  Problem: Education: Goal: Knowledge of General Education information will improve Description: Including pain rating scale, medication(s)/side effects and non-pharmacologic comfort measures Outcome: Progressing   Problem: Health Behavior/Discharge Planning: Goal: Ability to manage health-related needs will improve Outcome: Progressing   Problem: Clinical Measurements: Goal: Respiratory complications will improve Outcome: Progressing   Problem: Activity: Goal: Risk for activity intolerance will decrease Outcome: Progressing   Problem: Safety: Goal: Ability to remain free from injury will improve Outcome: Progressing   Problem: Education: Goal: Knowledge of risk factors and measures for prevention of condition will improve Outcome: Progressing   Problem: Respiratory: Goal: Will maintain a patent airway Outcome: Progressing

## 2020-08-18 NOTE — ED Triage Notes (Signed)
Pt reports SOB, cough, sneezing, and generalized weakness x 3-4 days.  States he received 3rd COVID vaccine 2 days ago.  Sats 87% on arrival.  Pt placed on 2 liters O2 via [place] and sats 98%.

## 2020-08-18 NOTE — ED Notes (Signed)
Patients son Aundra Millet [phone]

## 2020-08-18 NOTE — H&P (Signed)
Date: 08/18/2020               Patient Name:  Andrew Salas MRN: [id number]  DOB: [birth year] Age / Sex: 86 y.o., male   PCP: Paulina Fusi, MD         Medical Service: Internal Medicine Teaching Service         Attending Physician: Dr. Earl Lagos, MD    First Contact: Dr. Ames Dura, MD Pager: [phone]  Second Contact: Dr. Thurmon Fair, MD Pager: [phone]       After Hours (After 5p/  First Contact Pager: [phone]  weekends / holidays): Second Contact Pager: [phone]   Chief Complaint: Shortness of breath  History of Present Illness: Mr. Ahmari Duerson is a 86 year old man with past medical history of CKD3, diastolic CHF (last EF 50-55% 11/2016), HTN, and persistent atrial fibrillation (on Eliquis) who presents to the [facility] for evaluation of shortness of breath.  Patient reports receiving both doses of the COVID vaccination series followed by a third booster dose two days ago. He reports that for the past four days that he has had progressively worsening shortness of breath, cough and chest congestion. He states that his shortness of breath has been particularly bothersome with exertion, but relieved with rest. Otherwise, he denies symptoms of fevers, nausea, diarrhea, vomiting, chest pain, abdominal pain, pain with urination.  ED Course: On arrival to the ED, patient was saturating at 87% on room air, but otherwise was hemodynamically stable and afebrile. EKG revealed atrial fibrillation and right bundle branch block unchanged from baseline. Initial labs revealed COVID positive, CBC Hgb 11.7, MCV 94.3; BMP K 3.4, BUN 41, Cr 1.65; troponin 28; CXR bibasilar opacities are noted concerning for atelectasis or possibly infiltrates. He was started on dexamethasone and remdesivir in the ED and admitted to IMTS for further evaluation and management of his condition.  Meds:  No current facility-administered medications on file prior to encounter.   Current Outpatient  Medications on File Prior to Encounter  Medication Sig Dispense Refill  . Acetaminophen (TYLENOL PO) Take by mouth as directed.     Marland Kitchen apixaban (ELIQUIS) 2.5 MG TABS tablet Take 1 tablet (2.5 mg total) by mouth 2 (two) times daily. 60 tablet 11  . azelastine (OPTIVAR) 0.05 % ophthalmic solution 1 drop Two (2) times a day as directed    . benazepril (LOTENSIN) 40 MG tablet Take 40 mg by mouth daily.    . carboxymethylcellulose (REFRESH PLUS) 0.5 % SOLN Place 1 drop into both eyes 3 (three) times daily as needed (for dryness).     . cycloSPORINE (RESTASIS) 0.05 % ophthalmic emulsion Place 1 drop into both eyes 2 (two) times daily.    . diclofenac sodium (VOLTAREN) 1 % GEL Apply 2 g topically 4 (four) times daily.    Marland Kitchen diltiazem (CARDIZEM CD) 240 MG 24 hr capsule Take 1 capsule (240 mg total) by mouth daily. 30 capsule 0  . DiphenhydrAMINE HCl (BENADRYL PO) Take by mouth as directed.     . ferrous sulfate (FERROUSUL) 325 (65 FE) MG tablet Take 1 tablet (325 mg total) by mouth 3 (three) times daily with meals. 90 tablet 0  . furosemide (LASIX) 40 MG tablet Take 1 tablet (40 mg total) by mouth 2 (two) times daily. (Patient taking differently: Take 40 mg by mouth daily. ) 60 tablet 0  . latanoprost (XALATAN) 0.005 % ophthalmic solution Place 1 drop into both eyes at bedtime.    Marland Kitchen  Multiple Vitamins-Minerals (PRESERVISION AREDS 2 PO) Take 1 tablet by mouth 2 (two) times daily.    . pravastatin (PRAVACHOL) 40 MG tablet Take 40 mg by mouth daily.     Allergies: Allergies as of 08/18/2020  . (No Known Allergies)   Past Medical History:  Diagnosis Date  . Abnormal CXR   . Abscess in epidural space of lumbar spine 10/2016  . Arthritis    R knee  . Cardiomyopathy (HCC) 12/16/2016   Overview:  EF 45-50%  . Chronic kidney disease (CKD), stage III (moderate) (HCC)   . CKD (chronic kidney disease) 12/24/2016  . Diastolic congestive heart failure (HCC) 12/24/2016  . Discitis   . Hematoma of right flank  11/28/2016  . Hyperlipidemia   . Hypertension    Dr Dulce Sellar in [place] is pt's cardiologist.  . Hypertensive heart disease with heart failure (HCC) 12/16/2016  . Meningitis 11/11/2016  . Near syncope 11/28/2016  . Obesity hypoventilation syndrome (HCC), presumed 11/11/2016  . Osteoarthritis of right knee 02/05/2012   Admitting diagnosis   . Permanent atrial fibrillation (HCC)   . Persistent atrial fibrillation (HCC) 11/11/2016  . PNA (pneumonia)   . Pneumonia   . 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 Sepsis (HCC) 11/11/2016  . Ulcer    stomach 60 years ago- no current problem  . UTI (urinary tract infection) 11/11/2016   Family History:  Family History  Problem Relation Age of Onset  . Heart failure Father   . Cancer Father   . Macular degeneration Mother   . Anesthesia problems Neg Hx    Social History:  Social History   Tobacco Use  . Smoking status: Former Smoker    Years: 6.00  . Smokeless tobacco: Former Neurosurgeon    Quit date: 11/16/1950  Vaping Use  . Vaping Use: Never used  Substance Use Topics  . Alcohol use: Yes    Alcohol/week: 6.0 standard drinks    Types: 6 Glasses of wine per week  . Drug use: No   Review of Systems: A complete ROS was negative except as per HPI.  Physical Exam: Blood pressure 129/71, pulse 61, temperature 97.8 F (36.6 C), temperature source Oral, resp. rate 18, height 6' (1.829 m), weight 86.2 kg, SpO2 92 %. Physical Exam Constitutional:      General: He is not in acute distress.    Appearance: He is well-developed. He is not ill-appearing.  HENT:     Head: Normocephalic and atraumatic.  Eyes:     Extraocular Movements: Extraocular movements intact.     Pupils: Pupils are equal, round, and reactive to light.  Cardiovascular:     Rate and Rhythm: Normal rate. Rhythm irregular.  Pulmonary:     Effort: Pulmonary effort is normal. No tachypnea, accessory muscle usage or respiratory distress.      Breath sounds: Rales present.  Abdominal:     General: Bowel sounds are normal.     Palpations: Abdomen is soft.     Tenderness: There is no abdominal tenderness.  Musculoskeletal:        General: Normal range of motion.     Cervical back: Normal range of motion and neck supple.  Skin:    General: Skin is warm and dry.     Capillary Refill: Capillary refill takes less than 2 seconds.  Neurological:     General: No focal deficit present.     Mental Status: He is alert and oriented to person,  place, and time.  Psychiatric:        Mood and Affect: Mood normal. Mood is not anxious.        Behavior: Behavior normal. Behavior is not agitated.    EKG: personally reviewed my interpretation is atrial fibrillation with right bundle branch block  CXR: personally reviewed my interpretation is bibasilar opacities concerning for infiltrates  Assessment & Plan by Problem: Active Problems:   COVID-19  Mr. Severin Bou is a 86 year old man with past medical history of CKD3, diastolic CHF (last EF 50-55% 11/2016), HTN, and persistent atrial fibrillation (on Eliquis) who presents to the [facility] for evaluation of shortness of breath found to have COVID-19 infection.  #COVID-19 infection Patient presenting with worsening shortness of breath over the past four days despite full vaccination and booster dose two days prior. He initially presented saturating at 87%, however he has improved following initiation of 2L oxygen via nasal cannula. Given his new oxygen requirement and comorbid conditions he would benefit from admission  -Remdesivir -Dexamethasone -Continue supplemental oxygen, escalate as needed -follow-up CRP, D-Dimer, Ferritin, Fibrinogen, HFP, LDH, lactic acid, procalcitonin, triglycerides, blood cultures -Incentive spirometry  #Persistent atrial fibrillation, chronic Patient remains in atrial fibrillation upon admission. Prescribed apixaban 2.5mg  twice daily for  anticoagulation. -Continue apixaban  #HTN, chronic Patient mildly hypertensive in ED. Home regimen includes diltiazem 240mg  daily and lasix 40mg  daily -Continue home diltiazem  #Diastolic congestive heart failure, chronic Patient has diastolic congestive heart failure with last EF of 50-55% in January 2018. Home regimen includes lasix 40mg  daily. Euvolemic on exam. -Continue home lasix  Diet: Heart healthy IVF: none VTE ppx: Eliquis Code status: Full code  Dispo: Admit patient to Inpatient with expected length of stay greater than 2 midnights.  Signed: , MD 08/18/2020, 5:59 PM  Pager: [phone] After 5pm on weekdays and 1pm on weekends: On Call pager: [phone]

## 2020-08-18 NOTE — ED Provider Notes (Signed)
[facility] EMERGENCY DEPARTMENT Provider Note   CSN: [id number] Arrival date & time: 08/18/20  1011     History Chief Complaint  Patient presents with  . Shortness of Breath  . Cough    Andrew Salas is a 86 y.o. male.  Patient with history of chronic kidney disease, diastolic congestive heart failure on furosemide, permanent atrial fibrillation on Eliquis --presents to the emergency department for evaluation of shortness of breath that has been ongoing for the past 4 days.  This has been accompanying by generalized weakness and sneezing.  Patient has had some chest congestion and occasional cough.  Denies nausea, vomiting, or diarrhea.  He denies fevers, chest pain, abdominal pain.  No urinary symptoms.  States that he feels as though he is in good shape but for the past few days has been having difficulty due to the shortness of breath.  He does note that his sister passed away recently.  He denies any known sick contacts.  He has received both Covid vaccines including a booster 2 days ago.  On arrival to the emergency department patient at 87%.        Past Medical History:  Diagnosis Date  . Abnormal CXR   . Abscess in epidural space of lumbar spine 10/2016  . Arthritis    R knee  . Cardiomyopathy (HCC) 12/16/2016   Overview:  EF 45-50%  . Chronic kidney disease (CKD), stage III (moderate) (HCC)   . CKD (chronic kidney disease) 12/24/2016  . Diastolic congestive heart failure (HCC) 12/24/2016  . Discitis   . Hematoma of right flank 11/28/2016  . Hyperlipidemia   . Hypertension    Dr Dulce Sellar in [place] is pt's cardiologist.  . Hypertensive heart disease with heart failure (HCC) 12/16/2016  . Meningitis 11/11/2016  . Near syncope 11/28/2016  . Obesity hypoventilation syndrome (HCC), presumed 11/11/2016  . Osteoarthritis of right knee 02/05/2012   Admitting diagnosis   . Permanent atrial fibrillation (HCC)   . Persistent atrial fibrillation (HCC)  11/11/2016  . PNA (pneumonia)   . Pneumonia   . 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 Sepsis (HCC) 11/11/2016  . Ulcer    stomach 60 years ago- no current problem  . UTI (urinary tract infection) 11/11/2016    Patient Active Problem List   Diagnosis Date Noted  . CKD (chronic kidney disease) 12/24/2016  . Diastolic congestive heart failure (HCC) 12/24/2016  . Cardiomyopathy (HCC) 12/16/2016  . Hypertensive heart disease with heart failure (HCC) 12/16/2016  . 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 Near syncope 11/28/2016  . Hematoma of right flank 11/28/2016  . Hyperlipidemia 11/28/2016  . Abscess in epidural space of lumbar spine 11/17/2016  . Abnormal CXR   . Discitis   . PNA (pneumonia)   . Meningitis 11/11/2016  . Permanent atrial fibrillation (HCC) 11/11/2016  . Obesity hypoventilation syndrome (HCC), presumed 11/11/2016  . UTI (urinary tract infection) 11/11/2016  . Sepsis (HCC) 11/11/2016  . Osteoarthritis of right knee 02/05/2012    Class: End Stage    Past Surgical History:  Procedure Laterality Date  . CARDIAC CATHETERIZATION  6 yrs ago   [facility]  . EYE SURGERY  12/2010   Cataract bil with lens implant, please be careful if washing  eyes  . HARDWARE REMOVAL  12/25/2011   Procedure: HARDWARE REMOVAL;  Surgeon: Eldred Manges, MD;  Location: [facility] OR;  Service: Orthopedics;  Laterality: Right;   Hardware Removal Right Femur  . HERNIA REPAIR     per chest X- Ray  . KNEE ARTHROPLASTY  02/03/2012   Procedure: COMPUTER ASSISTED TOTAL KNEE ARTHROPLASTY;  Surgeon: Eldred Manges, MD;  Location: [facility] OR;  Service: Orthopedics;  Laterality: Right;  Right Total Knee Arthroplasty, Cemented  . KNEE ARTHROSCOPY     bilateral  . KNEE ARTHROSCOPY  1989   Right  . KNEE RECONSTRUCTION, MEDIAL PATELLAR FEMORAL LIGAMENT     "screws and pins inserted" from a fracture  . PROSTATE SURGERY      years ago "the Dr microwaved it"  . REPLACEMENT TOTAL KNEE         Family History  Problem Relation Age of Onset  . Heart failure Father   . Cancer Father   . Macular degeneration Mother   . Anesthesia problems Neg Hx     Social History   Tobacco Use  . Smoking status: Former Smoker    Years: 6.00  . Smokeless tobacco: Former Neurosurgeon    Quit date: 11/16/1950  Vaping Use  . Vaping Use: Never used  Substance Use Topics  . Alcohol use: Yes    Alcohol/week: 6.0 standard drinks    Types: 6 Glasses of wine per week  . Drug use: No    Home Medications Prior to Admission medications   Medication Sig Start Date End Date Taking? Authorizing Provider  Acetaminophen (TYLENOL PO) Take by mouth as directed.     [provider]  apixaban (ELIQUIS) 2.5 MG TABS tablet Take 1 tablet (2.5 mg total) by mouth 2 (two) times daily. 06/28/17   Baldo Daub, MD  azelastine (OPTIVAR) 0.05 % ophthalmic solution 1 drop Two (2) times a day as directed    [provider]  benazepril (LOTENSIN) 40 MG tablet Take 40 mg by mouth daily.    [provider]  carboxymethylcellulose (REFRESH PLUS) 0.5 % SOLN Place 1 drop into both eyes 3 (three) times daily as needed (for dryness).     [provider]  cycloSPORINE (RESTASIS) 0.05 % ophthalmic emulsion Place 1 drop into both eyes 2 (two) times daily.    [provider]  diclofenac sodium (VOLTAREN) 1 % GEL Apply 2 g topically 4 (four) times daily.    [provider]  diltiazem (CARDIZEM CD) 240 MG 24 hr capsule Take 1 capsule (240 mg total) by mouth daily. 11/18/16   Filbert Schilder, MD  DiphenhydrAMINE HCl (BENADRYL PO) Take by mouth as directed.     [provider]  ferrous sulfate (FERROUSUL) 325 (65 FE) MG tablet Take 1 tablet (325 mg total) by mouth 3 (three) times daily with meals. 12/01/16   Lenox Ponds, MD  furosemide (LASIX) 40 MG tablet Take 1 tablet (40 mg total) by mouth 2 (two)  times daily. Patient taking differently: Take 40 mg by mouth daily.  12/01/16   Lenox Ponds, MD  latanoprost (XALATAN) 0.005 % ophthalmic solution Place 1 drop into both eyes at bedtime.    [provider]  Multiple Vitamins-Minerals (PRESERVISION AREDS 2 PO) Take 1 tablet by mouth 2 (two) times daily.    [provider]  pravastatin (PRAVACHOL) 40 MG tablet Take 40 mg by mouth daily.    [provider]    Allergies    Patient has no known allergies.  Review of Systems   Review of Systems  Constitutional: Negative for fever.  HENT: Positive  for congestion and sneezing. Negative for rhinorrhea and sore throat.   Eyes: Negative for redness.  Respiratory: Positive for cough and shortness of breath.   Cardiovascular: Negative for chest pain.  Gastrointestinal: Negative for abdominal pain, diarrhea, nausea and vomiting.  Genitourinary: Negative for dysuria and hematuria.  Musculoskeletal: Negative for myalgias.  Skin: Negative for rash.  Neurological: Positive for weakness. Negative for headaches.    Physical Exam Updated Vital Signs BP (!) 141/96 (BP Location: Left Arm)   Pulse 72   Temp 97.8 F (36.6 C) (Oral)   Resp 15   Ht 6' (1.829 m)   Wt 86.2 kg   SpO2 100%   BMI 25.77 kg/m   Physical Exam Vitals and nursing note reviewed.  Constitutional:      Appearance: He is well-developed.  HENT:     Head: Normocephalic and atraumatic.  Eyes:     General:        Right eye: No discharge.        Left eye: No discharge.     Conjunctiva/sclera: Conjunctivae normal.  Cardiovascular:     Rate and Rhythm: Normal rate. Rhythm irregular.     Heart sounds: Normal heart sounds.  Pulmonary:     Effort: Pulmonary effort is normal.     Breath sounds: Examination of the right-middle field reveals rales. Examination of the left-middle field reveals rales. Examination of the right-lower field reveals rales. Examination of the left-lower field reveals rales.  Rales present. No decreased breath sounds.  Abdominal:     Palpations: Abdomen is soft.     Tenderness: There is no abdominal tenderness.  Musculoskeletal:     Cervical back: Normal range of motion and neck supple.     Right lower leg: No tenderness. No edema.     Left lower leg: No tenderness. No edema.  Skin:    General: Skin is warm and dry.  Neurological:     Mental Status: He is alert.     ED Results / Procedures / Treatments   Labs (all labs ordered are listed, but only abnormal results are displayed) Labs Reviewed  RESPIRATORY PANEL BY RT PCR (FLU A&B, COVID) - Abnormal; Notable for the following components:      Result Value   SARS Coronavirus 2 by RT PCR POSITIVE (*)    All other components within normal limits  BASIC METABOLIC PANEL - Abnormal; Notable for the following components:   Potassium 3.4 (*)    Glucose, Bld 144 (*)    BUN 41 (*)    Creatinine, Ser 1.65 (*)    Calcium 8.8 (*)    GFR calc non Af Amer 36 (*)    GFR calc Af Amer 41 (*)    All other components within normal limits  CBC - Abnormal; Notable for the following components:   RBC 4.06 (*)    Hemoglobin 11.7 (*)    HCT 38.3 (*)    All other components within normal limits  TROPONIN I (HIGH SENSITIVITY) - Abnormal; Notable for the following components:   Troponin I (High Sensitivity) 28 (*)    All other components within normal limits  CULTURE, BLOOD (ROUTINE X 2)  CULTURE, BLOOD (ROUTINE X 2)  LACTIC ACID, PLASMA  LACTIC ACID, PLASMA  D-DIMER, QUANTITATIVE (NOT AT [facility])  PROCALCITONIN  LACTATE DEHYDROGENASE  FERRITIN  TRIGLYCERIDES  FIBRINOGEN  C-REACTIVE PROTEIN  HEPATIC FUNCTION PANEL  TROPONIN I (HIGH SENSITIVITY)    EKG EKG Interpretation  Date/Time:  Sunday August 18 2020 10:23:30 EDT Ventricular Rate:  71 PR Interval:    QRS Duration: 138 QT Interval:  446 QTC Calculation: 484 R Axis:   -70 Text Interpretation: Atrial fibrillation Right bundle branch block Left anterior  fascicular block Minimal voltage criteria for LVH, may be normal variant ( R in aVL ) No significant change since last tracing Confirmed by Gwyneth SproutPlunkett, Whitney ([id number]) on 08/18/2020 3:43:28 PM   Radiology DG Chest Portable 1 View  Result Date: 08/18/2020 CLINICAL DATA:  Shortness of breath, cough. EXAM: PORTABLE CHEST 1 VIEW COMPARISON:  November 17, 2016. FINDINGS: Stable cardiomegaly. No pneumothorax is noted. Bibasilar opacities are noted concerning for atelectasis or possibly infiltrates. No definite pleural effusion is noted. Bony thorax is unremarkable. IMPRESSION: Bibasilar opacities are noted concerning for atelectasis or possibly infiltrates. Electronically Signed   By: Lupita RaiderJames  Green Jr M.D.   On: 08/18/2020 11:40    Procedures Procedures (including critical care time)  Medications Ordered in ED Medications  dexamethasone (DECADRON) injection 6 mg (has no administration in time range)  remdesivir 200 mg in sodium chloride 0.9% 250 mL IVPB (has no administration in time range)    Followed by  remdesivir 100 mg in sodium chloride 0.9 % 100 mL IVPB (has no administration in time range)    ED Course  I have reviewed the triage vital signs and the nursing notes.  Pertinent labs & imaging results that were available during my care of the patient were reviewed by me and considered in my medical decision making (see chart for details).  Patient seen and examined. COVID +. Work-up initiated. Medications ordered. X-ray shows infiltrates. Will admit to hospital. Pt agrees.   Discussed with Dr. Anitra LauthPlunkett.   Vital signs reviewed and are as follows: BP (!) 141/96 (BP Location: Left Arm)   Pulse 72   Temp 97.8 F (36.6 C) (Oral)   Resp 15   Ht 6' (1.829 m)   Wt 86.2 kg   SpO2 100%   BMI 25.77 kg/m   4:30 PM Spoke with IMTS who will see patient.   [name] was evaluated in Emergency Department on 08/18/2020 for the symptoms described in the history of present illness. He was  evaluated in the context of the global COVID-19 pandemic, which necessitated consideration that the patient might be at risk for infection with the SARS-CoV-2 virus that causes COVID-19. Institutional protocols and algorithms that pertain to the evaluation of patients at risk for COVID-19 are in a state of rapid change based on information released by regulatory bodies including the CDC and federal and state organizations. These policies and algorithms were followed during the patient's care in the ED.  CRITICAL CARE Performed by: Renne CriglerJoshua Margrit Minner PA-C Total critical care time: 32 minutes Critical care time was exclusive of separately billable procedures and treating other patients. Critical care was necessary to treat or prevent imminent or life-threatening deterioration. Critical care was time spent personally by me on the following activities: development of treatment plan with patient and/or surrogate as well as nursing, discussions with consultants, evaluation of patient's response to treatment, examination of patient, obtaining history from patient or surrogate, ordering and performing treatments and interventions, ordering and review of laboratory studies, ordering and review of radiographic studies, pulse oximetry and re-evaluation of patient's condition.     MDM Rules/Calculators/A&P                          Admit for COVID pna, hypoxia  with oxygen requirement.   Final Clinical Impression(s) / ED Diagnoses Final diagnoses:  Acute respiratory failure with hypoxia (HCC)  Pneumonia due to COVID-19 virus  Chronic kidney disease, unspecified CKD stage  Permanent atrial fibrillation [facility])    Rx / DC Orders ED Discharge Orders    None       Renne Crigler, PA-C 08/18/20 1631    Gwyneth Sprout, MD 08/19/20 2228

## 2020-08-19 ENCOUNTER — Other Ambulatory Visit (HOSPITAL_COMMUNITY): Payer: Self-pay | Admitting: Student

## 2020-08-19 LAB — CBC WITH DIFFERENTIAL/PLATELET
Abs Immature Granulocytes: 0.06 10*3/uL (ref 0.00–0.07)
Basophils Absolute: 0 10*3/uL (ref 0.0–0.1)
Basophils Relative: 0 %
Eosinophils Absolute: 0 10*3/uL (ref 0.0–0.5)
Eosinophils Relative: 0 %
HCT: 37 % — ABNORMAL LOW (ref 39.0–52.0)
Hemoglobin: 11.4 g/dL — ABNORMAL LOW (ref 13.0–17.0)
Immature Granulocytes: 1 %
Lymphocytes Relative: 5 %
Lymphs Abs: 0.3 10*3/uL — ABNORMAL LOW (ref 0.7–4.0)
MCH: 29.5 pg (ref 26.0–34.0)
MCHC: 30.8 g/dL (ref 30.0–36.0)
MCV: 95.9 fL (ref 80.0–100.0)
Monocytes Absolute: 0.3 10*3/uL (ref 0.1–1.0)
Monocytes Relative: 5 %
Neutro Abs: 5.4 10*3/uL (ref 1.7–7.7)
Neutrophils Relative %: 89 %
Platelets: 282 10*3/uL (ref 150–400)
RBC: 3.86 MIL/uL — ABNORMAL LOW (ref 4.22–5.81)
RDW: 14.5 % (ref 11.5–15.5)
WBC: 6.1 10*3/uL (ref 4.0–10.5)
nRBC: 0 % (ref 0.0–0.2)

## 2020-08-19 LAB — COMPREHENSIVE METABOLIC PANEL
ALT: 21 U/L (ref 0–44)
AST: 19 U/L (ref 15–41)
Albumin: 2.7 g/dL — ABNORMAL LOW (ref 3.5–5.0)
Alkaline Phosphatase: 118 U/L (ref 38–126)
Anion gap: 11 (ref 5–15)
BUN: 39 mg/dL — ABNORMAL HIGH (ref 8–23)
CO2: 25 mmol/L (ref 22–32)
Calcium: 8.6 mg/dL — ABNORMAL LOW (ref 8.9–10.3)
Chloride: 104 mmol/L (ref 98–111)
Creatinine, Ser: 1.39 mg/dL — ABNORMAL HIGH (ref 0.61–1.24)
GFR calc Af Amer: 51 mL/min — ABNORMAL LOW (ref >60)
GFR calc non Af Amer: 44 mL/min — ABNORMAL LOW (ref >60)
Glucose, Bld: 140 mg/dL — ABNORMAL HIGH (ref 70–99)
Potassium: 4 mmol/L (ref 3.5–5.1)
Sodium: 140 mmol/L (ref 135–145)
Total Bilirubin: 1 mg/dL (ref 0.3–1.2)
Total Protein: 6.2 g/dL — ABNORMAL LOW (ref 6.5–8.1)

## 2020-08-19 LAB — PHOSPHORUS: Phosphorus: 4.9 mg/dL — ABNORMAL HIGH (ref 2.5–4.6)

## 2020-08-19 LAB — D-DIMER, QUANTITATIVE: D-Dimer, Quant: 0.89 ug/mL-FEU — ABNORMAL HIGH (ref 0.00–0.50)

## 2020-08-19 LAB — MAGNESIUM: Magnesium: 2.1 mg/dL (ref 1.7–2.4)

## 2020-08-19 LAB — C-REACTIVE PROTEIN: CRP: 3.6 mg/dL — ABNORMAL HIGH (ref <1.0)

## 2020-08-19 LAB — FERRITIN: Ferritin: 1014 ng/mL — ABNORMAL HIGH (ref 24–336)

## 2020-08-19 MED ORDER — DEXAMETHASONE 6 MG PO TABS: 6.0000 mg | ORAL_TABLET | Freq: Every day | ORAL | 0 refills | Status: DC

## 2020-08-19 MED ORDER — POLYVINYL ALCOHOL 1.4 % OP SOLN: 1.0000 [drp] | Freq: Three times a day (TID) | OPHTHALMIC | 0 refills | Status: AC | PRN

## 2020-08-19 MED ORDER — ENSURE ENLIVE PO LIQD: 237.0000 mL | Freq: Two times a day (BID) | ORAL | 12 refills | Status: AC

## 2020-08-19 NOTE — Evaluation (Signed)
Physical Therapy Evaluation Patient Details Name: Andrew Salas MRN: [id number] DOB: 06/14/1926 Today's Date: 08/19/2020   History of Present Illness  86 year old man with past medical history of macular degeneration (legally blind), CKD3, diastolic CHF (last EF 50-55% 11/2016), HTN, and persistent atrial fibrillation (on Eliquis) who presents to the [facility] for evaluation of shortness of breath. Has had 3 doses of vaccine   Clinical Impression   Pt admitted with above diagnosis. On arrival on 2L O2 with sats 95%; on room air sats 90-97% (probe on earlobe and frequently not registering, however when a good waveform 90-97%). PTA patient lives in ALF, uses a rollator for walking indoors, an Art gallery manager when going out, and has an aid from 9-12 each day to assist with cleaning and cooking his meals. He reports he eats all meals in his room. Overall, he is likely close to or at baseline, however needed occasional minguard assist (perhaps due to using RW as no rollator available and/or unfamiliar environment). He feels he can go home as long as his aid will provide his usual services (especially cooking). Pt currently with functional limitations due to the deficits listed below (see PT Problem List). Pt will benefit from skilled PT to increase their independence and safety with mobility to allow discharge to the venue listed below.    *Note: OT has reached out to Case Manager to see if they know/can find out about his aid services when he returns.      Follow Up Recommendations No PT follow up;Supervision - Intermittent (supervision as PTA)    Equipment Recommendations  None recommended by PT    Recommendations for Other Services       Precautions / Restrictions Precautions Precautions: Fall Restrictions Weight Bearing Restrictions: No      Mobility  Bed Mobility Overal bed mobility: Modified Independent             General bed mobility comments: HOB ~30degrees, with  rail  Transfers Overall transfer level: Needs assistance Equipment used: Rolling walker (2 wheeled) Transfers: Sit to/from Stand Sit to Stand: Min guard         General transfer comment: for safety due to multiple lines/monitors/IV; denied dizziness  Ambulation/Gait Ambulation/Gait assistance: Min guard Gait Distance (Feet): 12 Feet (x2 seated rest at sink) Assistive device: Rolling walker (2 wheeled) Gait Pattern/deviations: Step-to pattern;Decreased stride length;Shuffle;Trunk flexed     General Gait Details: pt moves slowly, cautiously (unfamiliar environment and device--he usually uses a rollator)  Information systems manager [place] (Stroke Patients Only)       Balance Overall balance assessment: Modified Independent (with unilateral UE support at sink)                                           Pertinent Vitals/Pain Pain Assessment: No/denies pain    Home Living Family/patient expects to be discharged to:: Assisted living               Home Equipment: Insurance underwriter - 2 wheels;Walker - 4 wheels;Shower seat;Bedside commode;Grab bars - toilet;Grab bars - tub/shower Additional Comments: Has an aide that comes 9-12 each day and does cooking, cleaning.     Prior Function Level of Independence: Needs assistance   Gait / Transfers Assistance Needed: walks with rollator indoors; uses scooter when he  goes out (he drives into his [place] [employer] and caregiver drives him).   ADL's / Homemaking Assistance Needed: reports modified independent with basic ADLs; Has an aide that comes 9-12 each day and does cooking, cleaning.         Hand Dominance        Extremity/Trunk Assessment   Upper Extremity Assessment Upper Extremity Assessment: Defer to OT evaluation    Lower Extremity Assessment Lower Extremity Assessment: Generalized weakness    Cervical / Trunk Assessment Cervical / Trunk Assessment: Kyphotic   Communication   Communication: HOH  Cognition Arousal/Alertness: Awake/alert Behavior During Therapy: WFL for tasks assessed/performed Overall Cognitive Status: No family/caregiver present to determine baseline cognitive functioning Area of Impairment: Orientation;Memory                 Orientation Level: Situation (not aware he had COVID (reason for admission))   Memory: Decreased short-term memory         General Comments: otherwise with good safety awareness; once walked to sink he chose to sit to brush his teeth to conserve energy      General Comments General comments (skin integrity, edema, etc.): Patient initially felt very confident with going home today. PT questioned if his caregiver would come into his home while he is quarantining for COVID. Pt's initial response was that he did not know he had COVID. He stated if he will not have his aide, he cannot go home safely.     Exercises     Assessment/Plan    PT Assessment Patient needs continued PT services  PT Problem List Decreased strength;Decreased balance;Decreased mobility;Decreased cognition       PT Treatment Interventions Gait training;Functional mobility training;Therapeutic activities;Therapeutic exercise;Patient/family education    PT Goals (Current goals can be found in the Care Plan section)  Acute Rehab PT Goals Patient Stated Goal: wants to go home today if his aid is going to assist him PT Goal Formulation: With patient Time For Goal Achievement: 09/02/20 Potential to Achieve Goals: Good    Frequency Min 3X/week   Barriers to discharge Decreased caregiver support unsure if aid will be coming while has COVID    Co-evaluation PT/OT/SLP Co-Evaluation/Treatment: Yes Reason for Co-Treatment: For patient/therapist safety (due to multiple lines, IV, O2) PT goals addressed during session: Mobility/safety with mobility;Balance;Proper use of DME         AM-PAC PT "6 Clicks" Mobility   Outcome Measure Help needed turning from your back to your side while in a flat bed without using bedrails?: A Little Help needed moving from lying on your back to sitting on the side of a flat bed without using bedrails?: None Help needed moving to and from a bed to a chair (including a wheelchair)?: A Little Help needed standing up from a chair using your arms (e.g., wheelchair or bedside chair)?: A Little Help needed to walk in hospital room?: A Little Help needed climbing 3-5 steps with a railing? : A Little 6 Click Score: 19    End of Session Equipment Utilized During Treatment: Gait belt Activity Tolerance: Patient tolerated treatment well Patient left: in chair;with call bell/phone within reach;with chair alarm set Nurse Communication: Mobility status;Other (comment) (?dc plan ) PT Visit Diagnosis: Muscle weakness (generalized) (M62.81);Difficulty in walking, not elsewhere classified (R26.2)    Time: 7169-6789 PT Time Calculation (min) (ACUTE ONLY): 33 min   Charges:   PT Evaluation $PT Eval Low Complexity: 1 Low           Larita Fife  S, PT Pager [phone]   Zena Amos 08/19/2020, 1:47 PM

## 2020-08-19 NOTE — Discharge Summary (Addendum)
Name: Andrew Salas MRN: [id number] DOB: 01-24-1926 86 y.o. PCP: Andrew Fusi, MD  Date of Admission: 08/18/2020 10:14 AM Date of Discharge: 08/20/2020 Attending Physician: Earl Lagos, MD  Discharge Diagnosis: 1. Active Problems:   COVID-19  Discharge Medications: Allergies as of 08/19/2020   No Known Allergies     Medication List    STOP taking these medications   benazepril 40 MG tablet Commonly known as: LOTENSIN     TAKE these medications   acetaminophen 500 MG tablet Commonly known as: TYLENOL Take 500 mg by mouth at bedtime.   apixaban 2.5 MG Tabs tablet Commonly known as: ELIQUIS Take 1 tablet (2.5 mg total) by mouth 2 (two) times daily. What changed: Another medication with the same name was removed. Continue taking this medication, and follow the directions you see here.   azelastine 0.05 % ophthalmic solution Commonly known as: OPTIVAR 1 drop Two (2) times a day as directed   carboxymethylcellulose 0.5 % Soln Commonly known as: REFRESH PLUS Place 1 drop into both eyes 3 (three) times daily.   cycloSPORINE 0.05 % ophthalmic emulsion Commonly known as: RESTASIS Place 1 drop into both eyes 2 (two) times daily.   dexamethasone 6 MG tablet Commonly known as: DECADRON Take 1 tablet (6 mg total) by mouth daily for 8 doses. Start taking on: August 20, 2020   diclofenac sodium 1 % Gel Commonly known as: VOLTAREN Apply 2 g topically 2 (two) times daily as needed (pain).   diltiazem 240 MG 24 hr capsule Commonly known as: CARDIZEM CD Take 1 capsule (240 mg total) by mouth daily.   dorzolamide-timolol 22.3-6.8 MG/ML ophthalmic solution Commonly known as: COSOPT Place 1 drop into both eyes 2 (two) times daily.   feeding supplement (ENSURE ENLIVE) Liqd Take 237 mLs by mouth 2 (two) times daily between meals. Start taking on: August 20, 2020   ferrous sulfate 325 (65 FE) MG tablet Commonly known as: FerrouSul Take 1 tablet (325 mg  total) by mouth 3 (three) times daily with meals. What changed:   how much to take  when to take this  additional instructions   fluticasone 50 MCG/ACT nasal spray Commonly known as: FLONASE Place 1 spray into both nostrils at bedtime.   furosemide 40 MG tablet Commonly known as: LASIX Take 1 tablet (40 mg total) by mouth 2 (two) times daily. What changed: when to take this   Imodium A-D 2 MG tablet Generic drug: loperamide Take 2 mg by mouth daily as needed for diarrhea or loose stools.   latanoprost 0.005 % ophthalmic solution Commonly known as: XALATAN Place 1 drop into both eyes at bedtime.   polyvinyl alcohol 1.4 % ophthalmic solution Commonly known as: LIQUIFILM TEARS Place 1 drop into both eyes 3 (three) times daily as needed for dry eyes.   pravastatin 40 MG tablet Commonly known as: PRAVACHOL Take 40 mg by mouth at bedtime.   PRESERVISION AREDS 2 PO Take 1 tablet by mouth 2 (two) times daily.       Disposition and follow-up:   Andrew Salas was discharged from [facility] in Stable condition.  At the hospital follow up visit please address:  1.  Status of patient's shortness of breath and cough. Objective evaluation of hypoxia at rest and with exertion. Adherence to decadron and need for home supplemental oxygen.  2.  Labs / imaging needed at time of follow-up: CBC, CMP  3.  Pending labs/ test needing  follow-up: none  Follow-up Appointments:  Follow-up Information    Andrew Fusi, MD. Schedule an appointment as soon as possible for a visit in 1 week(s).   Specialty: Internal Medicine Contact information: [address] [place] [address] [place] [address] [phone]              Hospital Course by problem list:  #COVID-19 Andrew Salas [facility]2020 for evaluation of shortness of breath, cough, and weakness for 4 days. Although he received both doses of the COVID-19 vaccine  series [employer]) along with a third dose as a booster two days ago, he tested positive for COVID on arrival. He initially was saturating at 87% on room air with improvement to 100% on 2L O2 via [place]. He was started on remdesivir and decadron and admitted to IMTS for further evaluation and management. On evaluation the next morning, patient reported feeling significantly improved without continuation of his shortness of breath and cough. He was saturating well on 2L via nasal cannula and his oxygen was taken off with maintenance of saturation 95-100% at rest. Upon ambulation, his oxygen saturation was mainted >90-97% on room air. We discussed the option for discharge home with continuation of dexamethasone for eight additional days in the outpatient setting. Physical therapy and occupational therapy evaluated patient and had no further recommendations beyond his already established services at ALF, however they raised concern regarding patient being able to continue receiving established caregiver services despite his COVID-19 infection. Patient's caregiver reported being able to pick him up from the hospital the morning of 08/20/2020 and will be able to continue providing home services.  #Persistent atrial fibrillation, chronic Patient has a chronic history of persistent atrial fibrillation. He remained in atrial fibrillation throughout his hospitalization. He was continued on anticoagulation without complication.  #HTN, chronic Hypertension well-controlled on home regimen of diltiazem and lasix. Continued throughout hospitalization.  #Diastolic congestive heart failure, chronic Patient appears euvolemic throughout hospitalization. Continued lasix 40mg  daily  Discharge Vitals:   BP 111/72 (BP Location: Left Arm)   Pulse 76   Temp 97.6 F (36.4 C) (Oral)   Resp 18   Ht 6' (1.829 m)   Wt 86.2 kg   SpO2 100%   BMI 25.77 kg/m   Pertinent Labs, Studies, and Procedures:  CBC Latest Ref Rng & Units  08/19/2020 08/18/2020 08/18/2020  WBC 4.0 - 10.5 K/uL 6.1 7.1 8.1  Hemoglobin 13.0 - 17.0 g/dL 11.4(L) 11.4(L) 11.7(L)  Hematocrit 39 - 52 % 37.0(L) 37.2(L) 38.3(L)  Platelets 150 - 400 K/uL 282 262 294   CMP Latest Ref Rng & Units 08/19/2020 08/18/2020 08/18/2020  Glucose 70 - 99 mg/dL 10/18/2020) - 709(G)  BUN 8 - 23 mg/dL 283(M) - 62(H)  Creatinine 0.61 - 1.24 mg/dL 47(M) 5.46(T) 0.35(W)  Sodium 135 - 145 mmol/L 140 - 140  Potassium 3.5 - 5.1 mmol/L 4.0 - 3.4(L)  Chloride 98 - 111 mmol/L 104 - 100  CO2 22 - 32 mmol/L 25 - 26  Calcium 8.9 - 10.3 mg/dL 6.56(C) - 8.8(L)  Total Protein 6.5 - 8.1 g/dL 6.2(L) 6.5 -  Total Bilirubin 0.3 - 1.2 mg/dL 1.0 1.2 -  Alkaline Phos 38 - 126 U/L 118 121 -  AST 15 - 41 U/L 19 22 -  ALT 0 - 44 U/L 21 20 -   Discharge Instructions: Discharge Instructions    Call MD for:  difficulty breathing, headache or visual disturbances   Complete by: As  directed    Call MD for:  extreme fatigue   Complete by: As directed    Call MD for:  temperature >100.4   Complete by: As directed    Diet - low sodium heart healthy   Complete by: As directed    Increase activity slowly   Complete by: As directed      Mr. Daphine Deutscher,  It was a pleasure meeting you during your recent hospitalization. We are glad that you have done so well with your COVID-19 infection. We suspect that the vaccinations have helped you greatly with your condition. We would like you to continue taking dexamethasone (decadron) 6mg  every evening for eight additional days following discharge. It is also essential for you to follow-up closely with your PCP, Dr. , following discharge so that you can be evaluated on your progress with this condition. If you develop worsening shortness of breath or worsening symptoms, please contact your PCP and go to the nearest emergency department for evaluation and management.  Sincerely, Dr. Tomasa Blase, MD  Signed: Despina Hidden, MD 08/19/2020, 3:30 PM     Pager: [phone]

## 2020-08-19 NOTE — Hospital Course (Addendum)
#  COVID-19 [name] presented to the [facility] on 08/18/2020 for evaluation of shortness of breath, cough, and weakness for 4 days. Although he received both doses of the COVID-19 vaccine series [employer]) along with a third dose as a booster two days ago, he tested positive for COVID on arrival. He initially was saturating at 87% on room air with improvement to 100% on 2L O2 via [place]. He was started on remdesivir and decadron and admitted to IMTS for further evaluation and management. On evaluation the next morning, patient reported feeling significantly improved without continuation of his shortness of breath and cough. He was saturating well on 2L via nasal cannula and his oxygen was taken off with maintenance of saturation 95-100% at rest. Upon ambulation, his oxygen saturation was mainted >90-97% on room air. We discussed the option for discharge home with continuation of dexamethasone for eight additional days in the outpatient setting. Physical therapy and occupational therapy evaluated patient and had no further recommendations beyond his already established services at ALF, however they raised concern regarding patient being able to continue receiving established caregiver services despite his COVID-19 infection. Patient's caregiver reported being able to pick him up from the hospital the morning of 08/20/2020 and will be able to continue providing home services.  #Persistent atrial fibrillation, chronic Patient has a chronic history of persistent atrial fibrillation. He remained in atrial fibrillation throughout his hospitalization. He was continued on anticoagulation without complication.  #HTN, chronic Hypertension well-controlled on home regimen of diltiazem and lasix. Continued throughout hospitalization.  #Diastolic congestive heart failure, chronic Patient appears euvolemic throughout hospitalization. Continued lasix 40mg  daily

## 2020-08-19 NOTE — TOC Initial Note (Addendum)
Transition of Care [facility]) - Initial/Assessment Note    Patient Details  Name: Andrew Salas MRN: [id number] Date of Birth: Apr 01, 1926  [facility] CM/SW Contact:    Janae Bridgeman, RN Phone Number: 08/19/2020, 3:11 PM  Clinical Narrative:                 Case management spoke with the patient on the phone and also with the patient's son, Andrew Salas) that lives in [place].  The patient lives alone in an apartment in [place], [place] and receives home health aide services through [employer] - through the [place] benefits on Monday, Tuesday, Thursday, and Friday from 9 am to 12 noon.  Jenita Seashore, CNA is the nursing assistant that can be reached at [phone] and [phone].  I spoke with the nursing assistant, Miranda, and she is more than willing to transport the patient home and provide continued services to the patient when he is discharged.  I called and let the patient and family know this.  Case management will call [phone] when patient is able to discharge home.    Patient currently uses a rolling walker and electric scooter at home.  Patient is on room air at this time and is not requiring home oxygen.  I called and left a message with April, CM at [facility] and notified them that the patient was admitted 5 days ago with COVID and will be discharge home tomorrow.  Expected Discharge Plan: Home w Home Health Services Barriers to Discharge: Continued Medical Work up   Patient Goals and CMS Choice Patient states their goals for this hospitalization and ongoing recovery are:: Patient plans to discharge home with home health aid assistance through [employer] through the [place] benefits. CMS Medicare.gov Compare Post Acute Care list provided to:: Patient Choice offered to / list presented to : Patient  Expected Discharge Plan and Services Expected Discharge Plan: Home w Home Health Services   Discharge Planning Services: CM Consult   Living  arrangements for the past 2 months: Apartment Expected Discharge Date: 08/19/20                           [facility] Agency:  ([employer] aide through [place])        Prior Living Arrangements/Services Living arrangements for the past 2 months: Apartment Lives with:: Self Patient language and need for interpreter reviewed:: Yes Do you feel safe going back to the place where you live?: Yes      Need for Family Participation in Patient Care: Yes (Comment) Care giver support system in place?: Yes (comment) Current home services: Homehealth aide (home health aide through [place] [facility] [phone]) Criminal Activity/Legal Involvement Pertinent to Current Situation/Hospitalization: No - Comment as needed  Activities of Daily Living Home Assistive Devices/Equipment: Art gallery manager, Environmental consultant (specify type) ADL Screening (condition at time of admission) Patient's cognitive ability adequate to safely complete daily activities?: Yes Is the patient deaf or have difficulty hearing?: No Does the patient have difficulty seeing, even when wearing glasses/contacts?: No Does the patient have difficulty concentrating, remembering, or making decisions?: No Patient able to express need for assistance with ADLs?: Yes Does the patient have difficulty dressing or bathing?: No Independently performs ADLs?: No Communication: Independent Dressing (OT): Independent Grooming: Independent Feeding: Independent Bathing: Independent Toileting: Needs assistance Is this a change from baseline?: Change from baseline, expected to last <3 days In/Out Bed: Needs assistance Is  this a change from baseline?: Change from baseline, expected to last <3 days Walks in Home: Needs assistance Is this a change from baseline?: Pre-admission baseline Does the patient have difficulty walking or climbing stairs?: Yes Weakness of Legs: Both Weakness of Arms/Hands: None  Permission Sought/Granted Permission sought  to share information with : Case Manager Permission granted to share information with : Yes, Verbal Permission Granted     Permission granted to share info w AGENCY: [place] [facility] agency aide Jenita Seashore, CNA - [phone], [phone]  Permission granted to share info w Relationship: family contacts in [place]     Emotional Assessment Appearance:: Appears stated age Attitude/Demeanor/Rapport: Self-Confident Affect (typically observed): Accepting Orientation: : Oriented to Self, Oriented to Place, Oriented to  Time, Oriented to Situation Alcohol / Substance Use: Not Applicable Psych Involvement: No (comment)  Admission diagnosis:  Permanent atrial fibrillation (HCC) [I48.21] Acute respiratory failure with hypoxia (HCC) [J96.01] Chronic kidney disease, unspecified CKD stage [N18.9] Pneumonia due to COVID-19 virus [U07.1, J12.82] COVID-19 [U07.1] Patient Active Problem List   Diagnosis Date Noted  . COVID-19 08/18/2020  . CKD (chronic kidney disease) 12/24/2016  . Diastolic congestive heart failure (HCC) 12/24/2016  . Cardiomyopathy (HCC) 12/16/2016  . Hypertensive heart disease with heart failure (HCC) 12/16/2016  . 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 Near syncope 11/28/2016  . Hematoma of right flank 11/28/2016  . Hyperlipidemia 11/28/2016  . Abscess in epidural space of lumbar spine 11/17/2016  . Abnormal CXR   . Discitis   . PNA (pneumonia)   . Meningitis 11/11/2016  . Permanent atrial fibrillation (HCC) 11/11/2016  . Obesity hypoventilation syndrome (HCC), presumed 11/11/2016  . UTI (urinary tract infection) 11/11/2016  . Sepsis (HCC) 11/11/2016  . Osteoarthritis of right knee 02/05/2012    Class: End Stage   PCP:  Paulina Fusi, MD Pharmacy:   [phone] [place], [place] - [address] [facility] [place] [address] Phone: [phone] Fax: [phone]  [facility] [place], [place] - [address] [phone] [facility] [place] [address] Phone: [phone] Fax: [phone]  [facility] [place], [place] - [address] [place] [address] Phone: [phone] Fax: [phone]     Social Determinants of Health (SDOH) Interventions    Readmission Risk Interventions Readmission Risk Prevention Plan 08/19/2020  Transportation Screening Complete  PCP or Specialist Appt within 5-7 Days Complete  Home Care Screening Complete  Medication Review (RN CM) Complete  Some recent data might be hidden

## 2020-08-19 NOTE — Evaluation (Signed)
Occupational Therapy Evaluation Patient Details Name: Andrew Salas MRN: [id number] DOB: 1926/07/26 Today's Date: 08/19/2020    History of Present Illness 86 year old man with past medical history of macular degeneration (legally blind), CKD3, diastolic CHF (last EF 50-55% 11/2016), HTN, and persistent atrial fibrillation (on Eliquis) who presents to the [facility] for evaluation of shortness of breath. Has had 3 doses of vaccine    Clinical Impression   PTA, pt resides at an ALF and reports Modified Independence with ADLs and mobility using Rollator in the home and scooter in the community. Pt reports caregiver assist 9-12 daily for IADLs (cooking, cleaning). Pt presents with minor deficits in strength and activity tolerance, but overall appearance close to baseline. Pt min guard at most for mobility with RW, Min A at most for LB ADLs. Pt SpO2 >90% on RA during activities. Suspect if pt was in his own environment, he would do well with mobility and ADLs due to familiar DME and awareness of surroundings (legally blind at baseline). Pt reports concern over going home if caregivers unable to assist with IADLs due to his COVID+ status - reached out to case manager about this. No skilled OT services needed on discharge, but would benefit from skilled services at acute level to progress endurance and return to baseline.     Follow Up Recommendations  No OT follow up;Supervision - Intermittent (continued assist with IADLs)    Equipment Recommendations  None recommended by OT (has all needed DME)    Recommendations for Other Services       Precautions / Restrictions Precautions Precautions: Fall Restrictions Weight Bearing Restrictions: No      Mobility Bed Mobility Overal bed mobility: Modified Independent             General bed mobility comments: HOB ~30degrees, with rail  Transfers Overall transfer level: Needs assistance Equipment used: Rolling walker (2 wheeled) Transfers:  Sit to/from Stand Sit to Stand: Min guard         General transfer comment: for safety due to multiple lines/monitors/IV; denied dizziness    Balance Overall balance assessment: Modified Independent (with unilateral UE support at sink)                                         ADL either performed or assessed with clinical judgement   ADL Overall ADL's : Needs assistance/impaired Eating/Feeding: Set up;Sitting   Grooming: Supervision/safety;Sitting;Standing Grooming Details (indicate cue type and reason): Supervision for brushing teeth at sink. Pt opted to sit down at sink for majority of task to conserve energy. Supervision in standing to rinse and spit in sink Upper Body Bathing: Supervision/ safety;Sitting   Lower Body Bathing: Supervison/ safety;Sit to/from stand;Sitting/lateral leans   Upper Body Dressing : Supervision/safety;Sitting Upper Body Dressing Details (indicate cue type and reason): Supervision to don hospital gown around back sitting EOB Lower Body Dressing: Minimal assistance;Sitting/lateral leans;Sit to/from stand Lower Body Dressing Details (indicate cue type and reason): Overall Mod A to don R sock sitting EOB, able to don L sock. Pt reports wearing larger, stretchier socks at home. Anticipate pt would be able to don over waist in standing - Min A overall for LB dressing Toilet Transfer: Min guard;Ambulation;RW Toilet Transfer Details (indicate cue type and reason): simulated in room with RW, no overt LOB or safety concerns noted Toileting- Clothing Manipulation and Hygiene: Supervision/safety;Sit to/from stand  Functional mobility during ADLs: Min guard;Cueing for safety;Cueing for sequencing;Rolling walker General ADL Comments: Pt appears close to baseline and suspect that if pt were in his own environment, would function well without assist for ADLs (d/t visual deficits, different DME, and less lines)     Vision Baseline  Vision/History: Wears glasses;Legally blind;Macular Degeneration Wears Glasses: At all times Patient Visual Report: No change from baseline Vision Assessment?: No apparent visual deficits Additional Comments: macular degeneration, able to see far away but not up close. Has glasses he wears in bright lights, for reading, etc     Perception     Praxis      Pertinent Vitals/Pain Pain Assessment: No/denies pain     Hand Dominance Right   Extremity/Trunk Assessment Upper Extremity Assessment Upper Extremity Assessment: Generalized weakness   Lower Extremity Assessment Lower Extremity Assessment: Defer to PT evaluation   Cervical / Trunk Assessment Cervical / Trunk Assessment: Kyphotic   Communication Communication Communication: HOH   Cognition Arousal/Alertness: Awake/alert Behavior During Therapy: WFL for tasks assessed/performed Overall Cognitive Status: No family/caregiver present to determine baseline cognitive functioning Area of Impairment: Orientation;Memory                 Orientation Level: Situation (not aware he had COVID (reason for admission))   Memory: Decreased short-term memory         General Comments: otherwise with good safety awareness; once walked to sink he chose to sit to brush his teeth to conserve energy   General Comments  Patient initially felt very confident with going home today. PT questioned if his caregiver would come into his home while he is quarantining for COVID. Pt's initial response was that he did not know he had COVID. He stated if he will not have his aide, he cannot go home safely.  Pt on RA with SpO2 >90% throughout    Exercises     Shoulder Instructions      Home Living Family/patient expects to be discharged to:: Assisted living                             Home Equipment: Insurance underwriter - 2 wheels;Walker - 4 wheels;Shower seat;Bedside commode;Grab bars - toilet;Grab bars - tub/shower    Additional Comments: Has an aide that comes 9-12 each day and does cooking, cleaning.       Prior Functioning/Environment Level of Independence: Needs assistance  Gait / Transfers Assistance Needed: walks with rollator indoors; uses scooter when he goes out (he drives into his [employer] and caregiver drives him).  ADL's / Homemaking Assistance Needed: reports modified independent with basic ADLs; Has an aide that comes 9-12 each day and does cooking, cleaning.  Communication / Swallowing Assistance Needed: HOH          OT Problem List: Decreased activity tolerance;Cardiopulmonary status limiting activity;Decreased strength      OT Treatment/Interventions: Therapeutic exercise;Self-care/ADL training;Energy conservation;DME and/or AE instruction;Therapeutic activities;Patient/family education    OT Goals(Current goals can be found in the care plan section) Acute Rehab OT Goals Patient Stated Goal: wants to go home today if his aide is going to assist him OT Goal Formulation: With patient Time For Goal Achievement: 09/02/20 Potential to Achieve Goals: Good  OT Frequency: Min 2X/week   Barriers to D/C:    COVID+, unsure if caregivers can assist with IADLs       Co-evaluation PT/OT/SLP Co-Evaluation/Treatment: Yes Reason for Co-Treatment: For patient/therapist safety (multiple lines)  PT goals addressed during session: Mobility/safety with mobility;Balance;Proper use of DME OT goals addressed during session: ADL's and self-care      AM-PAC OT "6 Clicks" Daily Activity     Outcome Measure Help from another person eating meals?: A Little Help from another person taking care of personal grooming?: A Little Help from another person toileting, which includes using toliet, bedpan, or urinal?: A Little Help from another person bathing (including washing, rinsing, drying)?: A Little Help from another person to put on and taking off regular upper body clothing?: A Little Help from another  person to put on and taking off regular lower body clothing?: A Little 6 Click Score: 18   End of Session Equipment Utilized During Treatment: Gait belt;Rolling walker Nurse Communication: Mobility status;Other (comment) (request for menu and lunch ordering assistance)  Activity Tolerance: Patient tolerated treatment well Patient left: in chair;with call bell/phone within reach;with chair alarm set  OT Visit Diagnosis: Muscle weakness (generalized) (M62.81);Other (comment) (decreased cardiopulmonary tolerance)                Time: 0240-9735 OT Time Calculation (min): 40 min Charges:  OT General Charges $OT Visit: 1 Visit OT Evaluation $OT Eval Low Complexity: 1 Low OT Treatments $Self Care/Home Management : 8-22 mins  Lorre Munroe, OTR/L  Lorre Munroe 08/19/2020, 2:07 PM

## 2020-08-19 NOTE — Progress Notes (Signed)
Subjective:  Andrew Salas is a 86 year old man with past medical history of CKD3, diastolic CHF (last EF 50-55% 11/2016), HTN, and persistent atrial fibrillation (on Eliquis) who presents to the [facility] for evaluation of shortness of breath found to have COVID-19.  Overnight, no acute events.  This morning, he reports feeling well without complaints. He denies shortness of breath, chest pain, nausea, vomiting, diarrhea. He is interested in going home.  Objective:  Vital signs in last 24 hours: Vitals:   08/18/20 2000 08/18/20 2059 08/19/20 0040 08/19/20 0400  BP: 122/67 136/86 121/79 138/85  Pulse: 75 76 77 78  Resp: 19  (!) 23 18  Temp:  97.7 F (36.5 C) (!) 97.4 F (36.3 C) 97.6 F (36.4 C)  TempSrc:  Oral Oral Axillary  SpO2: 94% 97% 100% 100%  Weight:      Height:       SpO2: 100 % O2 Flow Rate (L/min): 2 L/min  Filed Weights   08/18/20 1531  Weight: 86.2 kg    Intake/Output Summary (Last 24 hours) at 08/19/2020 0704 Last data filed at 08/19/2020 0300 Gross per 24 hour  Intake 220 ml  Output --  Net 220 ml   Physical Exam Constitutional:      General: He is not in acute distress.    Appearance: He is well-developed. He is not ill-appearing.  HENT:     Head: Normocephalic and atraumatic.  Cardiovascular:     Rate and Rhythm: Normal rate. Rhythm irregular.  Pulmonary:     Effort: Pulmonary effort is normal. No tachypnea, accessory muscle usage or respiratory distress.     Breath sounds: Normal breath sounds.  Abdominal:     General: Bowel sounds are normal.     Palpations: Abdomen is soft.     Tenderness: There is no abdominal tenderness.  Musculoskeletal:        General: Normal range of motion.     Cervical back: Normal range of motion and neck supple.     Right lower leg: No edema.     Left lower leg: No edema.  Skin:    General: Skin is warm and dry.     Capillary Refill: Capillary refill takes less than 2 seconds.  Neurological:      General: No focal deficit present.     Mental Status: He is alert and oriented to person, place, and time.  Psychiatric:        Mood and Affect: Mood normal.        Behavior: Behavior normal.    CBC Latest Ref Rng & Units 08/19/2020 08/18/2020 08/18/2020  WBC 4.0 - 10.5 K/uL 6.1 7.1 8.1  Hemoglobin 13.0 - 17.0 g/dL 11.4(L) 11.4(L) 11.7(L)  Hematocrit 39 - 52 % 37.0(L) 37.2(L) 38.3(L)  Platelets 150 - 400 K/uL 282 262 294   CMP Latest Ref Rng & Units 08/19/2020 08/18/2020 08/18/2020  Glucose 70 - 99 mg/dL 182(X) - 937(J)  BUN 8 - 23 mg/dL 69(C) - 78(L)  Creatinine 0.61 - 1.24 mg/dL 3.81(O) 1.75(Z) 0.25(E)  Sodium 135 - 145 mmol/L 140 - 140  Potassium 3.5 - 5.1 mmol/L 4.0 - 3.4(L)  Chloride 98 - 111 mmol/L 104 - 100  CO2 22 - 32 mmol/L 25 - 26  Calcium 8.9 - 10.3 mg/dL 5.2(D) - 8.8(L)  Total Protein 6.5 - 8.1 g/dL 6.2(L) 6.5 -  Total Bilirubin 0.3 - 1.2 mg/dL 1.0 1.2 -  Alkaline Phos 38 - 126 U/L 118 121 -  AST 15 - 41 U/L 19 22 -  ALT 0 - 44 U/L 21 20 -   CRP 3.6 Triglycerides 76 LDH 256 Procalcitonin 3.41 D-dimer 0.89 Ferritin 1,014 Mg 2.1 Phos 4.9  Assessment/Plan:  Active Problems:   COVID-19  Andrew Salas is a 86 year old man with past medical history of CKD3, diastolic CHF (last EF 50-55% 11/2016), HTN, and persistent atrial fibrillation (on Eliquis) who presents to the [facility] for evaluation of shortness of breath found to have COVID-19 infection.  #COVID-19 infection Patient presented with worsening shortness of breath over the past four days despite full vaccination and booster dose two days prior. He initially presented saturating at 87%, however he has improved to 100% following initiation of 2L oxygen via nasal cannula. Given his new oxygen requirement and comorbid conditions he would benefit from admission. Today, he continues to saturate 100% on 2L [place], with removal of oxygen, he is saturating >95% at rest. -Decadron 6mg  daily -Remdesivir -Continue  supplemental oxygen as needed -Incentive spirometry -Contact/Airborne precautions -PT/OT eval and treat -Ambulate while measuring SpO2  #Persistent atrial fibrillation, chronic Patient remains in atrial fibrillation upon admission. Prescribed apixaban 2.5mg  twice daily for anticoagulation. -Continue apixaban  #HTN, chronic Patient mildly hypertensive in ED. Home regimen includes diltiazem 240mg  daily and lasix 40mg  daily -Continue home diltiazem   #Diastolic congestive heart failure, chronic Patient has diastolic congestive heart failure with last EF of 50-55% in January 2018. Home regimen includes lasix 40mg  daily. Euvolemic on exam. -Continue home lasix   Diet: Heart healthy IVF: none VTE ppx: Eliquis Bowel regimen: none Code status: Full code  , MD 08/19/2020, 7:01 AM Pager: [phone] After 5pm on weekdays and 1pm on weekends: On Call pager [phone]

## 2020-08-19 NOTE — Discharge Instructions (Signed)
[name],  It was a pleasure meeting you during your recent hospitalization. We are glad that you have done so well with your COVID-19 infection. We suspect that the vaccinations have helped you greatly with your condition. We would like you to continue taking dexamethasone (decadron) 6mg  every evening for eight additional days following discharge. It is also essential for you to follow-up closely with your PCP, Dr. , following discharge so that you can be evaluated on your progress with this condition. If you develop worsening shortness of breath or worsening symptoms, please contact your PCP and go to the nearest emergency department for evaluation and management.  Sincerely, Dr. Tomasa Blase, MD

## 2020-08-19 NOTE — Progress Notes (Signed)
°  Date: 08/19/2020  Patient name: Andrew Salas  Medical record number: [id number]  Date of birth: 1926/03/30   I have seen and evaluated Andrew Salas and discussed their care with the Residency Team.  In brief, patient is a 86 year old male with a past medical history of CKD stage IIIa, chronic diastolic heart failure, hypertension and persistent A. fib on anticoagulation who presented to the ED with progressively worsening shortness of breath over the last couple of days.  Patient states that he has received both doses of his Covid vaccine and received a booster dose 2 to 3 days prior to his admission.  Patient states that over the last 3 to 4 days he has noted progressively worsening shortness of breath with associated cough and chest congestion.  Patient states that he has dyspnea on exertion as well.  No fevers or chills, no chest pain, no palpitations, no lightheadedness, no syncope, no focal weakness, no tingling or numbness, no headache, no blurry vision, no nausea or vomiting, no abdominal pain, no diarrhea.  In the ED patient was noted to have an O2 sat of 87% on room air but otherwise was hemodynamically stable.  Today, patient states that his shortness of breath is improved and he feels better overall.  PMHx, Fam Hx, and/or Soc Hx : As per resident admit note  Vitals:   08/19/20 0400 08/19/20 0829  BP: 138/85 111/72  Pulse: 78 76  Resp: 18 18  Temp: 97.6 F (36.4 C) 97.6 F (36.4 C)  SpO2: 100% 100%   General: Awake, alert, oriented x3, NAD CVS: Irregularly irregular Lungs: CTA bilaterally, no crackles or wheezes noted Abdomen: Soft, nontender, nondistended, normoactive bowel sounds Extremities: No edema noted, nontender to palpation Psych: Normal affect HEENT: Normocephalic, atraumatic Skin: Warm and dry  Assessment and Plan: I have seen and evaluated the patient as outlined above. I agree with the formulated Assessment and Plan as detailed in the residents'  note, with the following changes:   1.  Acute hypoxic respiratory failure secondary to COVID-19 pneumonia: -Patient presented to ED with progressive worsening shortness of breath and cough with dyspnea on exertion over the last couple of days was noted to have an O2 sat of 87% on room air on admission.  Patient was vaccinated and received his Covid booster a couple of days ago. -Today, patient's O2 sats on room air were in the 90s -Patient was able to ambulate with PT and OT and maintain his O2 sats greater than 90% -We will continue with Decadron to complete a 10-day course (day 2) -Continue with remdesivir day 2 -Continue with supplemental oxygen as needed -No further work-up required at this time.  Patient will likely be stable for DC home today -Of note, patient does have help at home with his IADLs and will need to ascertain whether he can continue to receive the services with his Covid diagnosis.  Will reach out to transitions of care to see if this would be possible. -No further work-up at this time  Earl Lagos, MD 10/4/20212:59 PM

## 2020-08-20 LAB — PHOSPHORUS: Phosphorus: 4.6 mg/dL (ref 2.5–4.6)

## 2020-08-20 LAB — CBC WITH DIFFERENTIAL/PLATELET
Abs Immature Granulocytes: 0.07 10*3/uL (ref 0.00–0.07)
Basophils Absolute: 0 10*3/uL (ref 0.0–0.1)
Basophils Relative: 0 %
Eosinophils Absolute: 0 10*3/uL (ref 0.0–0.5)
Eosinophils Relative: 0 %
HCT: 37 % — ABNORMAL LOW (ref 39.0–52.0)
Hemoglobin: 11.5 g/dL — ABNORMAL LOW (ref 13.0–17.0)
Immature Granulocytes: 1 %
Lymphocytes Relative: 4 %
Lymphs Abs: 0.3 10*3/uL — ABNORMAL LOW (ref 0.7–4.0)
MCH: 29.6 pg (ref 26.0–34.0)
MCHC: 31.1 g/dL (ref 30.0–36.0)
MCV: 95.1 fL (ref 80.0–100.0)
Monocytes Absolute: 0.2 10*3/uL (ref 0.1–1.0)
Monocytes Relative: 3 %
Neutro Abs: 7.1 10*3/uL (ref 1.7–7.7)
Neutrophils Relative %: 92 %
Platelets: 294 10*3/uL (ref 150–400)
RBC: 3.89 MIL/uL — ABNORMAL LOW (ref 4.22–5.81)
RDW: 14.3 % (ref 11.5–15.5)
WBC: 7.7 10*3/uL (ref 4.0–10.5)
nRBC: 0 % (ref 0.0–0.2)

## 2020-08-20 LAB — D-DIMER, QUANTITATIVE: D-Dimer, Quant: 1.05 ug/mL-FEU — ABNORMAL HIGH (ref 0.00–0.50)

## 2020-08-20 LAB — COMPREHENSIVE METABOLIC PANEL
ALT: 20 U/L (ref 0–44)
AST: 18 U/L (ref 15–41)
Albumin: 2.5 g/dL — ABNORMAL LOW (ref 3.5–5.0)
Alkaline Phosphatase: 110 U/L (ref 38–126)
Anion gap: 11 (ref 5–15)
BUN: 47 mg/dL — ABNORMAL HIGH (ref 8–23)
CO2: 27 mmol/L (ref 22–32)
Calcium: 8.8 mg/dL — ABNORMAL LOW (ref 8.9–10.3)
Chloride: 101 mmol/L (ref 98–111)
Creatinine, Ser: 1.61 mg/dL — ABNORMAL HIGH (ref 0.61–1.24)
GFR calc Af Amer: 42 mL/min — ABNORMAL LOW (ref >60)
GFR calc non Af Amer: 37 mL/min — ABNORMAL LOW (ref >60)
Glucose, Bld: 159 mg/dL — ABNORMAL HIGH (ref 70–99)
Potassium: 4.5 mmol/L (ref 3.5–5.1)
Sodium: 139 mmol/L (ref 135–145)
Total Bilirubin: 0.9 mg/dL (ref 0.3–1.2)
Total Protein: 5.6 g/dL — ABNORMAL LOW (ref 6.5–8.1)

## 2020-08-20 LAB — C-REACTIVE PROTEIN: CRP: 2.1 mg/dL — ABNORMAL HIGH (ref <1.0)

## 2020-08-20 LAB — MAGNESIUM: Magnesium: 2 mg/dL (ref 1.7–2.4)

## 2020-08-20 LAB — FERRITIN: Ferritin: 888 ng/mL — ABNORMAL HIGH (ref 24–336)

## 2020-08-20 NOTE — TOC Progression Note (Signed)
Transition of Care [facility]) - Progression Note    Patient Details  Name: Majesty Oehlert MRN: [id number] Date of Birth: Nov 03, 1926  [facility] CM/SW Contact  Janae Bridgeman, RN Phone Number: 08/20/2020, 9:14 AM  Clinical Narrative:    [facility] management [facility] with [facility] and received information regarding [facility]'s [facility] follow up.  The patient is followed by the medical Green Team at [facility] - Dr. Irene Shipper.  Fredderick Phenix is MSW and can be reached at [phone] (ext. [phone]) for home needs.   Expected Discharge Plan: Home w Home Health Services Barriers to Discharge: Continued Medical Work up  Expected Discharge Plan and Services Expected Discharge Plan: Home w Home Health Services   Discharge Planning Services: CM Consult   Living arrangements for the past 2 months: Apartment Expected Discharge Date: 08/19/20                           [facility] Agency:  ([employer] aide through [place])         Social Determinants of Health (SDOH) Interventions    Readmission Risk Interventions Readmission Risk Prevention Plan 08/19/2020  Transportation Screening Complete  PCP or Specialist Appt within 5-7 Days Complete  Home Care Screening Complete  Medication Review (RN CM) Complete  Some recent data might be hidden

## 2020-08-20 NOTE — Progress Notes (Signed)
Physical Therapy Treatment Patient Details Name: Andrew Salas MRN: [id number] DOB: [birth year] Today's Date: 08/20/2020    History of Present Illness 86 year old man with past medical history of macular degeneration (legally blind), CKD3, diastolic CHF (last EF 50-55% 11/2016), HTN, and persistent atrial fibrillation (on Eliquis) who presents to the [facility] for evaluation of shortness of breath. Has had 3 doses of vaccine     PT Comments    Pt supine in bed on arrival this session.  Pt tolerated session well.  Performed short bout of gt training and functional transfers without difficulty.  He did present with bowel incontinence of bed pad but reports he was puzzled because he didn't know he had a BM in bed.  Pt to d/c home with support from his aide.       Follow Up Recommendations  No PT follow up;Supervision - Intermittent     Equipment Recommendations  None recommended by PT    Recommendations for Other Services       Precautions / Restrictions Precautions Precautions: Fall Restrictions Weight Bearing Restrictions: No    Mobility  Bed Mobility Overal bed mobility: Modified Independent                Transfers Overall transfer level: Needs assistance Equipment used: Rolling walker (2 wheeled) Transfers: Sit to/from Stand Sit to Stand: Supervision         General transfer comment: Cues for hand placement to and from seated surface.  Ambulation/Gait Ambulation/Gait assistance: Supervision Gait Distance (Feet): 8 Feet Assistive device: Rolling walker (2 wheeled) Gait Pattern/deviations: Step-to pattern;Decreased stride length;Shuffle;Trunk flexed     General Gait Details: Pt continues to move slow but safe.  Able to perform steps in room from bed to chair successfully.   Stairs             Wheelchair Mobility    Modified Rankin (Stroke Patients Only)       Balance Overall balance assessment: No apparent balance deficits (not formally  assessed)                                          Cognition Arousal/Alertness: Awake/alert Behavior During Therapy: WFL for tasks assessed/performed Overall Cognitive Status: Within Functional Limits for tasks assessed                                 General Comments: appears to be at baseline, cognition not formally assessed.      Exercises      General Comments General comments (skin integrity, edema, etc.): utilized RW for support, uses rollator at baseline.      Pertinent Vitals/Pain Pain Assessment: No/denies pain    Home Living                      Prior Function            PT Goals (current goals can now be found in the care plan section) Acute Rehab PT Goals Patient Stated Goal: Wants to get dressed before his ride gets here Potential to Achieve Goals: Good Progress towards PT goals: Progressing toward goals    Frequency    Min 3X/week      PT Plan Current plan remains appropriate    Co-evaluation  AM-PAC PT "6 Clicks" Mobility   Outcome Measure  Help needed turning from your back to your side while in a flat bed without using bedrails?: None Help needed moving from lying on your back to sitting on the side of a flat bed without using bedrails?: None Help needed moving to and from a bed to a chair (including a wheelchair)?: None Help needed standing up from a chair using your arms (e.g., wheelchair or bedside chair)?: None Help needed to walk in hospital room?: None Help needed climbing 3-5 steps with a railing? : A Little 6 Click Score: 23    End of Session Equipment Utilized During Treatment: Gait belt Activity Tolerance: Patient tolerated treatment well Patient left: in chair;with call bell/phone within reach;with chair alarm set Nurse Communication: Mobility status;Other (comment) (informed NT he is requesting to get dressed to prepare for d/c back to alf.) PT Visit Diagnosis: Muscle  weakness (generalized) (M62.81);Difficulty in walking, not elsewhere classified (R26.2)     Time: 2774-1287 PT Time Calculation (min) (ACUTE ONLY): 23 min  Charges:  $Therapeutic Activity: 23-37 mins                     Bonney Leitz , PTA Acute Rehabilitation Services Pager [phone] Office [phone]     Toleen Lachapelle Artis Delay 08/20/2020, 10:25 AM

## 2020-08-20 NOTE — Progress Notes (Signed)
Subjective:  [name] is a 86 year old man with past medical history of CKD3, diastolic CHF (last EF 50-55% 11/2016), HTN, and persistent atrial fibrillation (on Eliquis) who presents to the [facility] for evaluation of shortness of breath found to have COVID-19.  Overnight, removed telemetry leads and lost IV access.  This morning, he reports feeling well and denies shortness of breath, cough, fevers, chills, abdominal pain, nausea, vomiting. He states that he is interested in going home with support from his caregiver.  Objective:  Vital signs in last 24 hours: Vitals:   08/19/20 1544 08/19/20 2015 08/19/20 2359 08/20/20 0527  BP: 125/60 (!) 155/84 117/76 (!) 127/96  Pulse: 70 82 79 72  Resp: 16 20 20 18   Temp: 97.7 F (36.5 C) 98.7 F (37.1 C) 98.7 F (37.1 C) 98.1 F (36.7 C)  TempSrc: Oral Oral Oral Oral  SpO2: 99% 95% 93% 95%  Weight:      Height:       On room air  Filed Weights   08/18/20 1531  Weight: 86.2 kg    Intake/Output Summary (Last 24 hours) at 08/20/2020 10/20/2020 Last data filed at 08/19/2020 2300 Gross per 24 hour  Intake 460 ml  Output --  Net 460 ml   Physical Exam Constitutional:      General: He is not in acute distress.    Appearance: He is well-developed. He is not ill-appearing.  HENT:     Head: Normocephalic and atraumatic.  Cardiovascular:     Rate and Rhythm: Normal rate. Rhythm irregular.  Pulmonary:     Effort: Pulmonary effort is normal. No tachypnea, accessory muscle usage or respiratory distress.     Breath sounds: Normal breath sounds.  Abdominal:     General: Bowel sounds are normal.     Palpations: Abdomen is soft.     Tenderness: There is no abdominal tenderness.  Musculoskeletal:        General: Normal range of motion.     Cervical back: Normal range of motion and neck supple.     Right lower leg: No edema.     Left lower leg: No edema.  Skin:    General: Skin is warm and dry.     Capillary Refill: Capillary  refill takes less than 2 seconds.  Neurological:     General: No focal deficit present.     Mental Status: He is alert and oriented to person, place, and time.  Psychiatric:        Mood and Affect: Mood normal.        Behavior: Behavior normal.    CBC Latest Ref Rng & Units 08/20/2020 08/19/2020 08/18/2020  WBC 4.0 - 10.5 K/uL 7.7 6.1 7.1  Hemoglobin 13.0 - 17.0 g/dL 11.5(L) 11.4(L) 11.4(L)  Hematocrit 39 - 52 % 37.0(L) 37.0(L) 37.2(L)  Platelets 150 - 400 K/uL 294 282 262   CMP Latest Ref Rng & Units 08/20/2020 08/19/2020 08/18/2020  Glucose 70 - 99 mg/dL 10/18/2020) 960(A) -  BUN 8 - 23 mg/dL 540(J) 81(X) -  Creatinine 0.61 - 1.24 mg/dL 91(Y) 7.82(N) 5.62(Z)  Sodium 135 - 145 mmol/L 139 140 -  Potassium 3.5 - 5.1 mmol/L 4.5 4.0 -  Chloride 98 - 111 mmol/L 101 104 -  CO2 22 - 32 mmol/L 27 25 -  Calcium 8.9 - 10.3 mg/dL 3.08(M) 5.7(Q) -  Total Protein 6.5 - 8.1 g/dL 4.6(N) 6.2(L) 6.5  Total Bilirubin 0.3 - 1.2 mg/dL 0.9 1.0 1.2  Alkaline  Phos 38 - 126 U/L 110 118 121  AST 15 - 41 U/L 18 19 22   ALT 0 - 44 U/L 20 21 20    CRP 2.1 down from 3.6 Triglycerides 76 LDH 256 Procalcitonin 3.41 D-dimer 1.05 up from 0.89 Ferritin 888 down from 1,014 Mg 2.0 Phos 4.6  Assessment/Plan:  Active Problems:   COVID-19  [name] is a 86 year old man with past medical history of CKD3, diastolic CHF (last EF 50-55% 11/2016), HTN, and persistent atrial fibrillation (on Eliquis) who presents to the [facility] for evaluation of shortness of breath found to have COVID-19 infection.  #Acute Hypoxic Respiratory Failure 2/2 COVID-19 infection Patient presented with worsening shortness of breath over the past four days despite full vaccination and booster dose two days prior. He initially presented saturating at 87%, however he has improved to 100% following initiation of 2L oxygen via nasal cannula. Given his new oxygen requirement and comorbid conditions he was admitted. Yesterday, he was taken off  supplemental oxygen with maintenance of his oxygen saturation between 90-97% on room air and >90% with ambulation. PT and OT evaluated patient and recommended that he will be safe for discharge to his ALF with continuation of established caregiver services. -Decadron 6mg  (discharging home with eight doses) -Remdesivir, received two doses -Continue supplemental oxygen as needed -Incentive spirometry -Contact/Airborne precautions -PT/OT continue to eval and treat while admitted  #Persistent atrial fibrillation, chronic Patient remains in atrial fibrillation upon admission. Prescribed apixaban 2.5mg  twice daily for anticoagulation. -Continue apixaban  #HTN, chronic Patient mildly hypertensive in ED. Home regimen includes diltiazem 240mg  daily and lasix 40mg  daily -Continue home diltiazem   #Diastolic congestive heart failure, chronic Patient has diastolic congestive heart failure with last EF of 50-55% in January 2018. Home regimen includes lasix 40mg  daily. Euvolemic on exam. -Continue home lasix   Diet: Heart healthy IVF: none VTE ppx: Eliquis Bowel regimen: none Code status: Full code  [facility], MD 08/20/2020, 6:11 AM Pager: [phone] After 5pm on weekdays and 1pm on weekends: On Call pager [phone]

## 2020-08-20 NOTE — Progress Notes (Signed)
D/c education provided to patient, packet placed in bag. Patient d/c home with his home CNA Miranda, no distress noted when leaving.

## 2020-08-20 NOTE — Progress Notes (Signed)
Per Dr. Imogene Burn, pt is ok without IV.  Pt do discharge tomorrow.

## 2020-08-23 LAB — CULTURE, BLOOD (ROUTINE X 2)
Culture: NO GROWTH
Special Requests: ADEQUATE

## 2020-08-25 ENCOUNTER — Emergency Department (HOSPITAL_COMMUNITY): Payer: No Typology Code available for payment source

## 2020-08-25 ENCOUNTER — Other Ambulatory Visit: Payer: Self-pay

## 2020-08-25 ENCOUNTER — Inpatient Hospital Stay (HOSPITAL_COMMUNITY)
Admission: EM | Admit: 2020-08-25 | Discharge: 2020-08-30 | DRG: 871 | Disposition: A | Discharge status: Home Health | Payer: No Typology Code available for payment source | Attending: Internal Medicine | Admitting: Internal Medicine

## 2020-08-25 ENCOUNTER — Encounter (HOSPITAL_COMMUNITY): Payer: Self-pay | Admitting: Emergency Medicine

## 2020-08-25 DIAGNOSIS — Z809 Family history of malignant neoplasm, unspecified: Secondary | ICD-10-CM

## 2020-08-25 DIAGNOSIS — U071 COVID-19: Secondary | ICD-10-CM | POA: Diagnosis present

## 2020-08-25 DIAGNOSIS — R0603 Acute respiratory distress: Secondary | ICD-10-CM | POA: Diagnosis present

## 2020-08-25 DIAGNOSIS — I452 Bifascicular block: Secondary | ICD-10-CM | POA: Diagnosis present

## 2020-08-25 DIAGNOSIS — I5032 Chronic diastolic (congestive) heart failure: Secondary | ICD-10-CM | POA: Diagnosis present

## 2020-08-25 DIAGNOSIS — J9601 Acute respiratory failure with hypoxia: Secondary | ICD-10-CM | POA: Diagnosis present

## 2020-08-25 DIAGNOSIS — Z87891 Personal history of nicotine dependence: Secondary | ICD-10-CM | POA: Diagnosis not present

## 2020-08-25 DIAGNOSIS — J90 Pleural effusion, not elsewhere classified: Secondary | ICD-10-CM | POA: Diagnosis not present

## 2020-08-25 DIAGNOSIS — Z8249 Family history of ischemic heart disease and other diseases of the circulatory system: Secondary | ICD-10-CM | POA: Diagnosis not present

## 2020-08-25 DIAGNOSIS — J159 Unspecified bacterial pneumonia: Secondary | ICD-10-CM | POA: Diagnosis present

## 2020-08-25 DIAGNOSIS — N183 Chronic kidney disease, stage 3 unspecified: Secondary | ICD-10-CM | POA: Diagnosis present

## 2020-08-25 DIAGNOSIS — E785 Hyperlipidemia, unspecified: Secondary | ICD-10-CM | POA: Diagnosis present

## 2020-08-25 DIAGNOSIS — J1282 Pneumonia due to coronavirus disease 2019: Secondary | ICD-10-CM | POA: Diagnosis present

## 2020-08-25 DIAGNOSIS — R0602 Shortness of breath: Secondary | ICD-10-CM | POA: Diagnosis not present

## 2020-08-25 DIAGNOSIS — Z79899 Other long term (current) drug therapy: Secondary | ICD-10-CM | POA: Diagnosis not present

## 2020-08-25 DIAGNOSIS — Z7901 Long term (current) use of anticoagulants: Secondary | ICD-10-CM

## 2020-08-25 DIAGNOSIS — I491 Atrial premature depolarization: Secondary | ICD-10-CM | POA: Diagnosis not present

## 2020-08-25 DIAGNOSIS — I4821 Permanent atrial fibrillation: Secondary | ICD-10-CM | POA: Diagnosis present

## 2020-08-25 DIAGNOSIS — I429 Cardiomyopathy, unspecified: Secondary | ICD-10-CM | POA: Diagnosis present

## 2020-08-25 DIAGNOSIS — J189 Pneumonia, unspecified organism: Secondary | ICD-10-CM

## 2020-08-25 DIAGNOSIS — I130 Hypertensive heart and chronic kidney disease with heart failure and stage 1 through stage 4 chronic kidney disease, or unspecified chronic kidney disease: Secondary | ICD-10-CM | POA: Diagnosis present

## 2020-08-25 DIAGNOSIS — I499 Cardiac arrhythmia, unspecified: Secondary | ICD-10-CM | POA: Diagnosis not present

## 2020-08-25 DIAGNOSIS — A419 Sepsis, unspecified organism: Secondary | ICD-10-CM | POA: Diagnosis present

## 2020-08-25 DIAGNOSIS — D6859 Other primary thrombophilia: Secondary | ICD-10-CM | POA: Diagnosis present

## 2020-08-25 DIAGNOSIS — K922 Gastrointestinal hemorrhage, unspecified: Secondary | ICD-10-CM

## 2020-08-25 DIAGNOSIS — R195 Other fecal abnormalities: Secondary | ICD-10-CM | POA: Diagnosis present

## 2020-08-25 DIAGNOSIS — Z743 Need for continuous supervision: Secondary | ICD-10-CM | POA: Diagnosis not present

## 2020-08-25 DIAGNOSIS — I4891 Unspecified atrial fibrillation: Secondary | ICD-10-CM | POA: Diagnosis not present

## 2020-08-25 DIAGNOSIS — R0902 Hypoxemia: Secondary | ICD-10-CM

## 2020-08-25 LAB — COMPREHENSIVE METABOLIC PANEL
ALT: 27 U/L (ref 0–44)
AST: 24 U/L (ref 15–41)
Albumin: 3 g/dL — ABNORMAL LOW (ref 3.5–5.0)
Alkaline Phosphatase: 118 U/L (ref 38–126)
Anion gap: 15 (ref 5–15)
BUN: 48 mg/dL — ABNORMAL HIGH (ref 8–23)
CO2: 23 mmol/L (ref 22–32)
Calcium: 8.6 mg/dL — ABNORMAL LOW (ref 8.9–10.3)
Chloride: 101 mmol/L (ref 98–111)
Creatinine, Ser: 1.67 mg/dL — ABNORMAL HIGH (ref 0.61–1.24)
GFR, Estimated: 35 mL/min — ABNORMAL LOW (ref >60)
Glucose, Bld: 113 mg/dL — ABNORMAL HIGH (ref 70–99)
Potassium: 4.3 mmol/L (ref 3.5–5.1)
Sodium: 139 mmol/L (ref 135–145)
Total Bilirubin: 1.5 mg/dL — ABNORMAL HIGH (ref 0.3–1.2)
Total Protein: 6.5 g/dL (ref 6.5–8.1)

## 2020-08-25 LAB — LACTIC ACID, PLASMA: Lactic Acid, Venous: 3.6 mmol/L (ref 0.5–1.9)

## 2020-08-25 LAB — FERRITIN: Ferritin: 1355 ng/mL — ABNORMAL HIGH (ref 24–336)

## 2020-08-25 LAB — CBC WITH DIFFERENTIAL/PLATELET
Abs Immature Granulocytes: 0.21 10*3/uL — ABNORMAL HIGH (ref 0.00–0.07)
Basophils Absolute: 0 10*3/uL (ref 0.0–0.1)
Basophils Relative: 0 %
Eosinophils Absolute: 0 10*3/uL (ref 0.0–0.5)
Eosinophils Relative: 0 %
HCT: 45 % (ref 39.0–52.0)
Hemoglobin: 13.6 g/dL (ref 13.0–17.0)
Immature Granulocytes: 1 %
Lymphocytes Relative: 1 %
Lymphs Abs: 0.2 10*3/uL — ABNORMAL LOW (ref 0.7–4.0)
MCH: 28.9 pg (ref 26.0–34.0)
MCHC: 30.2 g/dL (ref 30.0–36.0)
MCV: 95.7 fL (ref 80.0–100.0)
Monocytes Absolute: 2 10*3/uL — ABNORMAL HIGH (ref 0.1–1.0)
Monocytes Relative: 9 %
Neutro Abs: 19.9 10*3/uL — ABNORMAL HIGH (ref 1.7–7.7)
Neutrophils Relative %: 89 %
Platelets: 314 10*3/uL (ref 150–400)
RBC: 4.7 MIL/uL (ref 4.22–5.81)
RDW: 14.4 % (ref 11.5–15.5)
WBC: 22.3 10*3/uL — ABNORMAL HIGH (ref 4.0–10.5)
nRBC: 0 % (ref 0.0–0.2)

## 2020-08-25 LAB — MRSA PCR SCREENING: MRSA by PCR: NEGATIVE

## 2020-08-25 LAB — PROCALCITONIN: Procalcitonin: 3.34 ng/mL

## 2020-08-25 LAB — POC OCCULT BLOOD, ED: Fecal Occult Bld: POSITIVE — AB

## 2020-08-25 LAB — D-DIMER, QUANTITATIVE: D-Dimer, Quant: 1.79 ug/mL-FEU — ABNORMAL HIGH (ref 0.00–0.50)

## 2020-08-25 LAB — GLUCOSE, CAPILLARY: Glucose-Capillary: 148 mg/dL — ABNORMAL HIGH (ref 70–99)

## 2020-08-25 LAB — PROTIME-INR
INR: 1.3 — ABNORMAL HIGH (ref 0.8–1.2)
Prothrombin Time: 15.8 seconds — ABNORMAL HIGH (ref 11.4–15.2)

## 2020-08-25 LAB — LACTATE DEHYDROGENASE: LDH: 267 U/L — ABNORMAL HIGH (ref 98–192)

## 2020-08-25 LAB — FIBRINOGEN: Fibrinogen: 653 mg/dL — ABNORMAL HIGH (ref 210–475)

## 2020-08-25 LAB — C-REACTIVE PROTEIN: CRP: 5.8 mg/dL — ABNORMAL HIGH (ref <1.0)

## 2020-08-25 MED ORDER — FLUTICASONE PROPIONATE 50 MCG/ACT NA SUSP
1.0000 | Freq: Every day | NASAL | Status: DC
  Administered 2020-08-26 – 2020-08-29 (×4): 1 via NASAL
  Filled 2020-08-25: qty 16

## 2020-08-25 MED ORDER — INSULIN DETEMIR 100 UNIT/ML ~~LOC~~ SOLN
0.0750 [IU]/kg | Freq: Two times a day (BID) | SUBCUTANEOUS | Status: DC
  Administered 2020-08-25 – 2020-08-28 (×6): 6 [IU] via SUBCUTANEOUS
  Filled 2020-08-25 (×8): qty 0.06

## 2020-08-25 MED ORDER — DILTIAZEM HCL ER COATED BEADS 120 MG PO CP24
240.0000 mg | ORAL_CAPSULE | Freq: Every day | ORAL | Status: DC
  Administered 2020-08-25 – 2020-08-29 (×5): 240 mg via ORAL
  Filled 2020-08-25 (×3): qty 2
  Filled 2020-08-25: qty 1
  Filled 2020-08-25: qty 2

## 2020-08-25 MED ORDER — SODIUM CHLORIDE 0.9 % IV SOLN
100.0000 mg | Freq: Every day | INTRAVENOUS | Status: AC
  Administered 2020-08-25 – 2020-08-29 (×5): 100 mg via INTRAVENOUS
  Filled 2020-08-25 (×6): qty 20

## 2020-08-25 MED ORDER — SODIUM CHLORIDE 0.9 % IV SOLN
2.0000 g | Freq: Once | INTRAVENOUS | Status: AC
  Administered 2020-08-25: 2 g via INTRAVENOUS
  Filled 2020-08-25: qty 2

## 2020-08-25 MED ORDER — VANCOMYCIN HCL 1500 MG/300ML IV SOLN
1500.0000 mg | INTRAVENOUS | Status: AC
  Administered 2020-08-25: 1500 mg via INTRAVENOUS
  Filled 2020-08-25: qty 300

## 2020-08-25 MED ORDER — DEXAMETHASONE 6 MG PO TABS
6.0000 mg | ORAL_TABLET | Freq: Every day | ORAL | Status: DC
  Administered 2020-08-25 – 2020-08-30 (×6): 6 mg via ORAL
  Filled 2020-08-25 (×6): qty 1

## 2020-08-25 MED ORDER — ALBUTEROL SULFATE HFA 108 (90 BASE) MCG/ACT IN AERS
2.0000 | INHALATION_SPRAY | RESPIRATORY_TRACT | Status: DC | PRN
  Administered 2020-08-25: 2 via RESPIRATORY_TRACT
  Filled 2020-08-25: qty 6.7

## 2020-08-25 MED ORDER — POLYETHYLENE GLYCOL 3350 17 G PO PACK: 17.0000 g | PACK | Freq: Every day | ORAL | Status: DC | PRN

## 2020-08-25 MED ORDER — VANCOMYCIN HCL 1250 MG/250ML IV SOLN
1250.0000 mg | INTRAVENOUS | Status: DC
  Filled 2020-08-25: qty 250

## 2020-08-25 MED ORDER — INSULIN ASPART 100 UNIT/ML ~~LOC~~ SOLN
0.0000 [IU] | SUBCUTANEOUS | Status: DC
  Administered 2020-08-25: 1 [IU] via SUBCUTANEOUS
  Administered 2020-08-26: 3 [IU] via SUBCUTANEOUS
  Administered 2020-08-26: 1 [IU] via SUBCUTANEOUS
  Administered 2020-08-26: 3 [IU] via SUBCUTANEOUS
  Administered 2020-08-27: 2 [IU] via SUBCUTANEOUS
  Administered 2020-08-27: 3 [IU] via SUBCUTANEOUS
  Administered 2020-08-28 – 2020-08-29 (×2): 1 [IU] via SUBCUTANEOUS
  Administered 2020-08-29: 2 [IU] via SUBCUTANEOUS
  Administered 2020-08-29: 3 [IU] via SUBCUTANEOUS

## 2020-08-25 MED ORDER — ACETAMINOPHEN 325 MG PO TABS: 650.0000 mg | ORAL_TABLET | Freq: Four times a day (QID) | ORAL | Status: DC | PRN

## 2020-08-25 MED ORDER — SODIUM CHLORIDE 0.9 % IV SOLN: 1.0000 g | Freq: Once | INTRAVENOUS | Status: DC

## 2020-08-25 NOTE — Progress Notes (Signed)
Returned from lunch break and an Charity fundraiser stated that my pump alarm was going off.  I went to the room and found that the ED RN dropped of the patient and did NOT notify anyone on our unit.

## 2020-08-25 NOTE — Progress Notes (Addendum)
Pharmacy Antibiotic Note  Pacer Andrew Salas is a 86 y.o. male admitted on 08/25/2020 with pneumonia and sepsis.  Pharmacy has been consulted for Vancomycin dosing. Pt also given Cefepime in ED. Pt with recent hospitalization (d/c 10/5) for COVID.  Remdesivir given 10/3>>10/4 during last hospitalization and now want to restart, will not reload - just 100mg  x 5 days 10/10>>10/14  Plan: Vancomycin 1500mg  IV now then 1250mg  IV q24h Will f/u renal function, micro data, and pt's clinical condition Vanc levels prn F/u gram negative coverage Remdesivir 100mg  daily x 5 days      Temp (24hrs), Avg:100.5 F (38.1 C), Min:100.5 F (38.1 C), Max:100.5 F (38.1 C)  Recent Labs  Lab 08/18/20 1741 08/19/20 0443 08/20/20 0449 08/25/20 1137  WBC 7.1 6.1 7.7 22.3*  CREATININE 1.89* 1.39* 1.61* 1.67*  LATICACIDVEN 1.4  --   --  3.6*    Estimated Creatinine Clearance: 31 mL/min (A) (by C-G formula based on SCr of 1.67 mg/dL (H)).    No Known Allergies  Antimicrobials this admission: 10/10 Cefepime x 1 10/10 Vanc >>   Microbiology results: 10/10 BCx:   Thank you for allowing pharmacy to be a part of this patient's care.  10/18/20, PharmD, BCPS Please see amion for complete clinical pharmacist phone list 08/25/2020 3:00 PM

## 2020-08-25 NOTE — H&P (Signed)
Date: 08/25/2020               Patient Name:  Andrew Salas MRN: [id number]  DOB: 1925-11-21 Age / Sex: 86 y.o., male   PCP: Paulina Fusi, MD         Medical Service: Internal Medicine Teaching Service         Attending Physician: Dr. Earl Lagos    First Contact: Dr. Roylene Reason Pager: [phone]  Second Contact: Dr. Thurmon Fair Pager: [phone]       After Hours (After 5p/  First Contact Pager: [phone]  weekends / holidays): Second Contact Pager: [phone]   Chief Complaint: shortness of breath, dark stools  History of Present Illness:   Andrew Salas is a 86 year old man with past medical history of recent COVID-19 infection for which he was admitted to the [facility] 08/18/20-08/20/20 for treatment, CKD3, diastolic CHF (last EF 50-55% 11/2016), HTN, and persistent atrial fibrillation (on Eliquis) who presents to the [facility] for evaluation of shortness of breath.During his recent admission from 08/18/20-08/20/20, he received two doses of remdesivir, and was discharged home with instructions to complete a 10-day course of decadron. He initially required 2L oxygen via nasal cannula however was quickly weaned to room air. He was discharged back to his assisted living facility. He received both doses of the COVID-19 vaccine series [employer]) as well as a booster vaccine on 08/16/20.  Since his last admission, Andrew Salas states that he has been doing okay, until 3 days ago when he began to have [facility] and fevers. He states that he came back because he required more oxygen and did not have any at home. He additionally has noted a new cough which is productive for yellow mucus. He denies headaches, n/v, chest pain, abdominal pain. He does endorse diarrhea, which was black, a week ago.   ED Course: En route to the ED via EMS, patient was saturating 80% on room air, improved with nonrebreather. In the ED, he was febrile to 38.1 (100.5 F), tachycardic to the 110s, tachypneic to 20s,  normotensive, saturating 84% on room air with O2 saturations improved to >90% on 2L [place]. Labs remarkable for sCr 1.67 (baseline approximately 1.61), lactic acid 3.6, WBC 22.3, D-dimer 1.79, procalcitonin 3.34, LDH 267, ferritin 1355, fribrinogen 653, CRP 5.8, positive POC occult blood. Blood cultures were drawn. CXR shows patch consolidation of bilateral lungs. Patient was started on vacomycin and cefepime.   Lab Orders     Culture, blood (Routine x 2)     MRSA PCR Screening     Comprehensive metabolic panel     Lactic acid, plasma     CBC with Differential     Protime-INR     Urinalysis, Routine w reflex microscopic     D-dimer, quantitative     Procalcitonin     Lactate dehydrogenase     Ferritin     Fibrinogen     C-reactive protein     CBC with Differential/Platelet     Comprehensive metabolic panel     C-reactive protein     D-dimer, quantitative (not at [facility])     Ferritin     Magnesium     Phosphorus     POC occult blood, ED   Meds:  Current Meds  Medication Sig  . acetaminophen (TYLENOL) 500 MG tablet Take 500 mg by mouth at bedtime.  Marland Kitchen apixaban (ELIQUIS) 5 MG TABS tablet Take 2.5 mg by mouth 2 (two)  times daily.  . carboxymethylcellulose (REFRESH PLUS) 0.5 % SOLN Place 1 drop into both eyes 3 (three) times daily.   Marland Kitchen dexamethasone (DECADRON) 6 MG tablet Take 1 tablet (6 mg total) by mouth daily for 8 doses.  Marland Kitchen diclofenac sodium (VOLTAREN) 1 % GEL Apply 2 g topically 2 (two) times daily as needed (pain).   Marland Kitchen diltiazem (CARDIZEM CD) 240 MG 24 hr capsule Take 1 capsule (240 mg total) by mouth daily.  . dorzolamide-timolol (COSOPT) 22.3-6.8 MG/ML ophthalmic solution Place 1 drop into both eyes 2 (two) times daily.  . feeding supplement, ENSURE ENLIVE, (ENSURE ENLIVE) LIQD Take 237 mLs by mouth 2 (two) times daily between meals.  . ferrous sulfate (FERROUSUL) 325 (65 FE) MG tablet Take 1 tablet (325 mg total) by mouth 3 (three) times daily with meals. (Patient taking  differently: Take 650 mg by mouth See admin instructions. Take 2 tablets (650 mg) by mouth on even numbered days)  . fluticasone (FLONASE) 50 MCG/ACT nasal spray Place 1 spray into both nostrils at bedtime.  . furosemide (LASIX) 40 MG tablet Take 1 tablet (40 mg total) by mouth 2 (two) times daily. (Patient taking differently: Take 40 mg by mouth daily. )  . latanoprost (XALATAN) 0.005 % ophthalmic solution Place 1 drop into both eyes at bedtime.  Marland Kitchen loperamide (IMODIUM A-D) 2 MG tablet Take 2 mg by mouth daily as needed for diarrhea or loose stools.   . Multiple Vitamins-Minerals (PRESERVISION AREDS 2 PO) Take 1 tablet by mouth 2 (two) times daily.  . polyvinyl alcohol (LIQUIFILM TEARS) 1.4 % ophthalmic solution Place 1 drop into both eyes 3 (three) times daily as needed for dry eyes.  . pravastatin (PRAVACHOL) 40 MG tablet Take 40 mg by mouth at bedtime.     Social History:  Social History   Socioeconomic History  . Marital status: Widowed    Spouse name: Not on file  . Number of children: Not on file  . Years of education: Not on file  . Highest education level: Not on file  Occupational History  . Not on file  Tobacco Use  . Smoking status: Former Smoker    Years: 6.00  . Smokeless tobacco: Former Neurosurgeon    Quit date: 11/16/1950  Vaping Use  . Vaping Use: Never used  Substance and Sexual Activity  . Alcohol use: Yes    Alcohol/week: 6.0 standard drinks    Types: 6 Glasses of wine per week  . Drug use: No  . Sexual activity: Not on file  Other Topics Concern  . Not on file  Social History Narrative  . Not on file   Social Determinants of Health   Financial Resource Strain:   . Difficulty of Paying Living Expenses: Not on file  Food Insecurity:   . Worried About Programme researcher, broadcasting/film/video in the Last Year: Not on file  . Ran Out of Food in the Last Year: Not on file  Transportation Needs:   . Lack of Transportation (Medical): Not on file  . Lack of Transportation (Non-Medical):  Not on file  Physical Activity:   . Days of Exercise per Week: Not on file  . Minutes of Exercise per Session: Not on file  Stress:   . Feeling of Stress : Not on file  Social Connections:   . Frequency of Communication with Friends and Family: Not on file  . Frequency of Social Gatherings with Friends and Family: Not on file  . Attends Religious Services:  Not on file  . Active Member of Clubs or Organizations: Not on file  . Attends Banker Meetings: Not on file  . Marital Status: Not on file  Intimate Partner Violence:   . Fear of Current or Ex-Partner: Not on file  . Emotionally Abused: Not on file  . Physically Abused: Not on file  . Sexually Abused: Not on file   Family History:  Family History  Problem Relation Age of Onset  . Heart failure Father   . Cancer Father   . Macular degeneration Mother   . Anesthesia problems Neg Hx     Allergies: Allergies as of 08/25/2020  . (No Known Allergies)   Past Medical History:  Diagnosis Date  . Abnormal CXR   . Abscess in epidural space of lumbar spine 10/2016  . Arthritis    R knee  . Cardiomyopathy (HCC) 12/16/2016   Overview:  EF 45-50%  . Chronic kidney disease (CKD), stage III (moderate) (HCC)   . CKD (chronic kidney disease) 12/24/2016  . Diastolic congestive heart failure (HCC) 12/24/2016  . Discitis   . Hematoma of right flank 11/28/2016  . Hyperlipidemia   . Hypertension    Dr Dulce Sellar in [place] is pt's cardiologist.  . Hypertensive heart disease with heart failure (HCC) 12/16/2016  . Meningitis 11/11/2016  . Near syncope 11/28/2016  . Obesity hypoventilation syndrome (HCC), presumed 11/11/2016  . Osteoarthritis of right knee 02/05/2012   Admitting diagnosis   . Permanent atrial fibrillation (HCC)   . Persistent atrial fibrillation (HCC) 11/11/2016  . PNA (pneumonia)   . Pneumonia   . 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 Sepsis (HCC) 11/11/2016  . Ulcer      stomach 60 years ago- no current problem  . UTI (urinary tract infection) 11/11/2016     Review of Systems: A complete ROS was negative except as per HPI.   Physical Exam: Blood pressure 112/64, pulse 100, temperature 98 F (36.7 C), temperature source Oral, resp. rate 20, height 6' (1.829 m), weight 86.2 kg, SpO2 100 %. Constitutional: well-appearing comfortably lying in bed, in no acute distress HENT: normocephalic atraumatic, mucous membranes moist Eyes: conjunctiva non-erythematous Neck: supple Cardiovascular: regular rate and rhythm, no m/r/g Pulmonary/Chest: normal work of breathing on room air, lungs clear to auscultation bilaterally Abdominal: soft, non-tender, non-distended MSK: normal bulk and tone Neurological: alert & oriented x 3, 5/5 strength in bilateral upper and lower extremities, normal gait   Labs: CBC    Component Value Date/Time   WBC 22.3 (H) 08/25/2020 1137   RBC 4.70 08/25/2020 1137   HGB 13.6 08/25/2020 1137   HCT 45.0 08/25/2020 1137   PLT 314 08/25/2020 1137   MCV 95.7 08/25/2020 1137   MCH 28.9 08/25/2020 1137   MCHC 30.2 08/25/2020 1137   RDW 14.4 08/25/2020 1137   LYMPHSABS 0.2 (L) 08/25/2020 1137   MONOABS 2.0 (H) 08/25/2020 1137   EOSABS 0.0 08/25/2020 1137   BASOSABS 0.0 08/25/2020 1137     CMP     Component Value Date/Time   NA 139 08/25/2020 1137   K 4.3 08/25/2020 1137   CL 101 08/25/2020 1137   CO2 23 08/25/2020 1137   GLUCOSE 113 (H) 08/25/2020 1137   BUN 48 (H) 08/25/2020 1137   CREATININE 1.67 (H) 08/25/2020 1137   CALCIUM 8.6 (L) 08/25/2020 1137   PROT 6.5 08/25/2020 1137   ALBUMIN 3.0 (L) 08/25/2020 1137   AST 24 08/25/2020  1137   ALT 27 08/25/2020 1137   ALKPHOS 118 08/25/2020 1137   BILITOT 1.5 (H) 08/25/2020 1137   GFRNONAA 35 (L) 08/25/2020 1137   GFRAA 42 (L) 08/20/2020 0449      Imaging: DG Chest Portable 1 View  Result Date: 08/25/2020 CLINICAL DATA:  Shortness of breath. EXAM: PORTABLE CHEST 1  VIEW COMPARISON:  August 18, 2020 FINDINGS: Patchy consolidation of bilateral lungs are identified. There is probably a small left pleural effusion. Heart size is enlarged. The mediastinal contour is normal. Bony structures are unchanged. IMPRESSION: Bilateral pneumonias. Electronically Signed   By: Sherian ReinWei-Chen  Lin M.D.   On: 08/25/2020 14:40    EKG: personally reviewed my interpretation is atrial fibrillation with RBBB and left anterior fascicular block  Assessment & Plan by Problem: Active Problems:   Pneumonia due to COVID-19 virus   Andrew Salas is a 86 year old man with past medical history of recent COVID-19 infection for which he was admitted to the [facility] 08/18/20-08/20/20 for treatment, CKD3, diastolic CHF (last EF 50-55% 11/2016), HTN, and persistent atrial fibrillation (on Eliquis) who [facility] evaluation of shortness of breath and admit for Covid 19 pneumonia with superimposed bacterial pneumonia.   Acute hypoxic respiratory failure secondary to COVID-19 pneumonia with Likely 2/2 Superimposed Bacterial Pneumonia  Today is day 11 of symptoms. Requiring 2 L [place] to maintain saturations >90%. Inflammatory markers are elevated on arrival to the ED, with a  procal of 3.34, slightly elevated temp of 100.5. His Xray appears to show worsening RLL involvement compared to previous. His likely etiology is superimposed bacterial pneumonia in association of his COVID vs aspiration pneumonia. He was started on vancomycin per pharmacy as well as cefepime, given his recent hospital admission making HCAP coverage necessary. - MRSA nasal swab - Continue Vancomycin per pharmacy - Continue Cefepime  - on day 1/5of remdesivir  - on day 1/10 of dexamethasone with covid 19 hyperglycemic control   - 6U Lantus, sensitive SSI - currently on 2 L [place]; wean as able - IS, flutter valve  - antitussives - self prone when able  - Trend inflammatory markers daily - Monitor  vitals  Guaiac-positive stools Patient with recent history of black stools. Reports that they have been ongoing for a week. While his Hgb is 13, this may be due to hemoconcentration. Will hold home anticoags at this time.  - Hold eliquis - SCDs  Persistent atrial fibrillation, chronic Patient in atrial fibrillation upon admission. Prescribed apixaban 2.5 mg twice daily for anticoagulation. - Continue Cardizem 240 mg daily   HTN On diltiazem 240 mg daily and Lasix 40 mg daily at home. - Continue home diltiazem 240 mg daily  Heart failure with preserved ejection fraction Patient has diastolic congestive heart failure with last EF of 50-55% in January 2018. Prescribed Lasix 40 mg daily. Euvolemic on exam. - Hold home Lasix 40 mg daily - Continue Cardizem  Diet: Carb/Renal VTE: SCDs IVF: None,None Code: Full  Prior to Admission Living Arrangement: home Anticipated Discharge Location: Home Barriers to Discharge: Continued medical workup  Dispo: Admit patient to Inpatient with expected length of stay greater than 2 midnights.  Signed: Dolan AmenSteven Keimani Laufer, MD [facility], PGY-2 Pager: [phone] 08/25/2020,9:24 PM

## 2020-08-25 NOTE — ED Notes (Signed)
Attempted to give report and was told nurse will call back 

## 2020-08-25 NOTE — ED Provider Notes (Signed)
[facility] Provider Note   CSN: [id number] Arrival date & time: 08/25/20  1108     History Chief Complaint  Patient presents with  . Respiratory Distress    Andrew Salas is a 86 y.o. male.  86 year old male with history of chronic kidney disease, cardiomyopathy (EF 45 to 50%), diastolic heart failure, permanent A. fib (on Eliquis), brought in by EMS from independent living facility for respiratory distress.  Patient was seen in this emergency room on October 3, tested positive for Covid and was admitted October 3-5.  Patient was treated with remdesivir and Decadron and discharged with improvement in room air O2 sats and condition.  On EMS arrival today, patient was found with O2 sat of 80% on room air, improved with nonrebreather.  Patient arrives tachypneic, febrile, states that he is feeling unwell Reports loose, dark stools for the past 2 days, no history of prior GI bleed.  Andrew Salas was evaluated in Emergency Department on 08/25/2020 for the symptoms described in the history of present illness. He was evaluated in the context of the global COVID-19 pandemic, which necessitated consideration that the patient might be at risk for infection with the SARS-CoV-2 virus that causes COVID-19. Institutional protocols and algorithms that pertain to the evaluation of patients at risk for COVID-19 are in a state of rapid change based on information released by regulatory bodies including the CDC and federal and state organizations. These policies and algorithms were followed during the patient's care in the ED.         Past Medical History:  Diagnosis Date  . Abnormal CXR   . Abscess in epidural space of lumbar spine 10/2016  . Arthritis    R knee  . Cardiomyopathy (HCC) 12/16/2016   Overview:  EF 45-50%  . Chronic kidney disease (CKD), stage III (moderate) (HCC)   . CKD (chronic kidney disease) 12/24/2016  . Diastolic congestive  heart failure (HCC) 12/24/2016  . Discitis   . Hematoma of right flank 11/28/2016  . Hyperlipidemia   . Hypertension    Dr Dulce [place]un[place] pt's cardiologist.  . Hypertensive heart disease with heart failure (HCC) 12/16/2016  . Meningitis 11/11/2016  . Near syncope 11/28/2016  . Obesity hypoventilation syndrome (HCC), presumed 11/11/2016  . Osteoarthritis of right knee 02/05/2012   Admitting diagnosis   . Permanent atrial fibrillation (HCC)   . Persistent atrial fibrillation (HCC) 11/11/2016  . PNA (pneumonia)   . Pneumonia   . 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 Sepsis (HCC) 11/11/2016  . Ulcer    stomach 60 years ago- no current problem  . UTI (urinary tract infection) 11/11/2016    Patient Active Problem List   Diagnosis Date Noted  . Pneumonia due to COVID-19 virus 08/25/2020  . COVID-19 08/18/2020  . CKD (chronic kidney disease) 12/24/2016  . Diastolic congestive heart failure (HCC) 12/24/2016  . Cardiomyopathy (HCC) 12/16/2016  . Hypertensive heart disease with heart failure (HCC) 12/16/2016  . 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 Near syncope 11/28/2016  . Hematoma of right flank 11/28/2016  . Hyperlipidemia 11/28/2016  . Abscess in epidural space of lumbar spine 11/17/2016  . Abnormal CXR   . Discitis   . PNA (pneumonia)   . Meningitis 11/11/2016  . Permanent atrial fibrillation (HCC) 11/11/2016  . Obesity hypoventilation syndrome (HCC), presumed 11/11/2016  . UTI (urinary tract infection)  11/11/2016  . Sepsis (HCC) 11/11/2016  . Osteoarthritis of right knee 02/05/2012    Class: End Stage    Past Surgical History:  Procedure Laterality Date  . CARDIAC CATHETERIZATION  6 yrs ago   [facility]  . EYE SURGERY  12/2010   Cataract bil with lens implant, please be careful if washing  eyes  . HARDWARE REMOVAL  12/25/2011   Procedure: HARDWARE REMOVAL;  Surgeon: Eldred Manges, MD;  Location: [facility] OR;  Service: Orthopedics;  Laterality: Right;   Hardware Removal Right Femur  . HERNIA REPAIR     per chest X- Ray  . KNEE ARTHROPLASTY  02/03/2012   Procedure: COMPUTER ASSISTED TOTAL KNEE ARTHROPLASTY;  Surgeon: Eldred Manges, MD;  Location: [facility] OR;  Service: Orthopedics;  Laterality: Right;  Right Total Knee Arthroplasty, Cemented  . KNEE ARTHROSCOPY     bilateral  . KNEE ARTHROSCOPY  1989   Right  . KNEE RECONSTRUCTION, MEDIAL PATELLAR FEMORAL LIGAMENT     "screws and pins inserted" from a fracture  . PROSTATE SURGERY     years ago "the Dr microwaved it"  . REPLACEMENT TOTAL KNEE         Family History  Problem Relation Age of Onset  . Heart failure Father   . Cancer Father   . Macular degeneration Mother   . Anesthesia problems Neg Hx     Social History   Tobacco Use  . Smoking status: Former Smoker    Years: 6.00  . Smokeless tobacco: Former Neurosurgeon    Quit date: 11/16/1950  Vaping Use  . Vaping Use: Never used  Substance Use Topics  . Alcohol use: Yes    Alcohol/week: 6.0 standard drinks    Types: 6 Glasses of wine per week  . Drug use: No    Home Medications Prior to Admission medications   Medication Sig Start Date End Date Taking? Authorizing Provider  acetaminophen (TYLENOL) 500 MG tablet Take 500 mg by mouth at bedtime.   Yes [provider]  apixaban (ELIQUIS) 5 MG TABS tablet Take 2.5 mg by mouth 2 (two) times daily.   Yes [provider]  carboxymethylcellulose (REFRESH PLUS) 0.5 % SOLN Place 1 drop into both eyes 3 (three) times daily.    Yes [provider]  dexamethasone (DECADRON) 6 MG tablet Take 1 tablet (6 mg total) by mouth daily for 8 doses. 08/20/20 08/28/20 Yes Roylene Reason, MD  diclofenac sodium (VOLTAREN) 1 % GEL Apply 2 g topically 2 (two) times daily as needed (pain).    Yes [provider]  diltiazem (CARDIZEM CD) 240 MG 24 hr capsule Take 1 capsule (240 mg total) by mouth  daily. 11/18/16  Yes Filbert Schilder, MD  dorzolamide-timolol (COSOPT) 22.3-6.8 MG/ML ophthalmic solution Place 1 drop into both eyes 2 (two) times daily.   Yes [provider]  feeding supplement, ENSURE ENLIVE, (ENSURE ENLIVE) LIQD Take 237 mLs by mouth 2 (two) times daily between meals. 08/20/20  Yes Roylene Reason, MD  ferrous sulfate (FERROUSUL) 325 (65 FE) MG tablet Take 1 tablet (325 mg total) by mouth 3 (three) times daily with meals. Patient taking differently: Take 650 mg by mouth See admin instructions. Take 2 tablets (650 mg) by mouth on even numbered days 12/01/16  Yes Randel Pigg, Dorma Russell, MD  fluticasone [facility]) 50 MCG/ACT nasal spray Place 1 spray into both nostrils at bedtime.   Yes [provider]  furosemide (LASIX) 40 MG tablet Take  1 tablet (40 mg total) by mouth 2 (two) times daily. Patient taking differently: Take 40 mg by mouth daily.  12/01/16  Yes Randel Pigg, Dorma Russell, MD  latanoprost (XALATAN) 0.005 % ophthalmic solution Place 1 drop into both eyes at bedtime.   Yes [provider]  loperamide (IMODIUM A-D) 2 MG tablet Take 2 mg by mouth daily as needed for diarrhea or loose stools.    Yes [provider]  Multiple Vitamins-Minerals (PRESERVISION AREDS 2 PO) Take 1 tablet by mouth 2 (two) times daily.   Yes [provider]  polyvinyl alcohol (LIQUIFILM TEARS) 1.4 % ophthalmic solution Place 1 drop into both eyes 3 (three) times daily as needed for dry eyes. 08/19/20  Yes Roylene Reason, MD  pravastatin (PRAVACHOL) 40 MG tablet Take 40 mg by mouth at bedtime.    Yes [provider]  apixaban (ELIQUIS) 2.5 MG TABS tablet Take 1 tablet (2.5 mg total) by mouth 2 (two) times daily. Patient not taking: Reported on 08/18/2020 06/28/17   Baldo Daub, MD    Allergies    Patient has no known allergies.  Review of Systems   Review of Systems  Unable to perform ROS: Acuity of condition  Constitutional: Positive for  fever.  Respiratory: Positive for cough and shortness of breath.   Gastrointestinal: Positive for blood in stool and diarrhea. Negative for abdominal pain.  Neurological: Positive for weakness.    Physical Exam Updated Vital Signs BP (!) 118/96   Pulse (!) 36   Temp (!) 100.5 F (38.1 C) (Rectal)   Resp 20   SpO2 93%   Physical Exam Vitals and nursing note reviewed.  Constitutional:      General: He is in acute distress.     Appearance: He is well-developed. He is ill-appearing. He is not diaphoretic.  HENT:     Head: Normocephalic and atraumatic.     Mouth/Throat:     Mouth: Mucous membranes are dry.  Eyes:     Conjunctiva/sclera: Conjunctivae normal.  Cardiovascular:     Rate and Rhythm: Tachycardia present. Rhythm irregular.     Pulses: Normal pulses.     Heart sounds: Normal heart sounds.  Pulmonary:     Effort: Pulmonary effort is normal.     Breath sounds: Normal breath sounds. No wheezing, rhonchi or rales.  Abdominal:     Palpations: Abdomen is soft.     Tenderness: There is no abdominal tenderness.  Musculoskeletal:     Right lower leg: No edema.     Left lower leg: No edema.  Skin:    General: Skin is warm and dry.     Findings: No erythema or rash.  Neurological:     Mental Status: He is alert and oriented to person, place, and time.     Sensory: No sensory deficit.     Motor: No weakness.  Psychiatric:        Behavior: Behavior normal.     ED Results / Procedures / Treatments   Labs (all labs ordered are listed, but only abnormal results are displayed) Labs Reviewed  COMPREHENSIVE METABOLIC PANEL - Abnormal; Notable for the following components:      Result Value   Glucose, Bld 113 (*)    BUN 48 (*)    Creatinine, Ser 1.67 (*)    Calcium 8.6 (*)    Albumin 3.0 (*)    Total Bilirubin 1.5 (*)    GFR, Estimated 35 (*)    All other components  within normal limits  LACTIC ACID, PLASMA - Abnormal; Notable for the following components:   Lactic  Acid, Venous 3.6 (*)    All other components within normal limits  CBC WITH DIFFERENTIAL/PLATELET - Abnormal; Notable for the following components:   WBC 22.3 (*)    Neutro Abs 19.9 (*)    Lymphs Abs 0.2 (*)    Monocytes Absolute 2.0 (*)    Abs Immature Granulocytes 0.21 (*)    All other components within normal limits  PROTIME-INR - Abnormal; Notable for the following components:   Prothrombin Time 15.8 (*)    INR 1.3 (*)    All other components within normal limits  D-DIMER, QUANTITATIVE (NOT AT [facility]) - Abnormal; Notable for the following components:   D-Dimer, Quant 1.79 (*)    All other components within normal limits  LACTATE DEHYDROGENASE - Abnormal; Notable for the following components:   LDH 267 (*)    All other components within normal limits  FERRITIN - Abnormal; Notable for the following components:   Ferritin 1,355 (*)    All other components within normal limits  FIBRINOGEN - Abnormal; Notable for the following components:   Fibrinogen 653 (*)    All other components within normal limits  C-REACTIVE PROTEIN - Abnormal; Notable for the following components:   CRP 5.8 (*)    All other components within normal limits  POC OCCULT BLOOD, ED - Abnormal; Notable for the following components:   Fecal Occult Bld POSITIVE (*)    All other components within normal limits  CULTURE, BLOOD (ROUTINE X 2)  CULTURE, BLOOD (ROUTINE X 2)  PROCALCITONIN  LACTIC ACID, PLASMA  URINALYSIS, ROUTINE W REFLEX MICROSCOPIC  TRIGLYCERIDES    EKG EKG Interpretation  Date/Time:  Sunday August 25 2020 11:45:39 EDT Ventricular Rate:  92 PR Interval:    QRS Duration: 144 QT Interval:  355 QTC Calculation: 440 R Axis:   -65 Text Interpretation: Atrial fibrillation RBBB and LAFB No significant change since last tracing Confirmed by Alvira Monday ([id number]) on 08/25/2020 12:46:27 PM   Radiology DG Chest Portable 1 View  Result Date: 08/25/2020 CLINICAL DATA:  Shortness of breath.  EXAM: PORTABLE CHEST 1 VIEW COMPARISON:  August 18, 2020 FINDINGS: Patchy consolidation of bilateral lungs are identified. There is probably a small left pleural effusion. Heart size is enlarged. The mediastinal contour is normal. Bony structures are unchanged. IMPRESSION: Bilateral pneumonias. Electronically Signed   By: Sherian Rein M.D.   On: 08/25/2020 14:40    Procedures .Critical Care Performed by: Jeannie Fend, PA-C Authorized by: Jeannie Fend, PA-C   Critical care provider statement:    Critical care time (minutes):  45   Critical care was time spent personally by me on the following activities:  Discussions with consultants, evaluation of patient's response to treatment, examination of patient, ordering and performing treatments and interventions, ordering and review of laboratory studies, ordering and review of radiographic studies, pulse oximetry, re-evaluation of patient's condition, obtaining history from patient or surrogate and review of old charts   (including critical care time)  Medications Ordered in ED Medications  vancomycin (VANCOREADY) IVPB 1500 mg/300 mL (1,500 mg Intravenous New Bag/Given 08/25/20 1402)  acetaminophen (TYLENOL) tablet 650 mg (has no administration in time range)  polyethylene glycol (MIRALAX / GLYCOLAX) packet 17 g (has no administration in time range)  vancomycin (VANCOREADY) IVPB 1250 mg/250 mL (has no administration in time range)  remdesivir 100 mg in sodium chloride 0.9 % 100 mL IVPB (  has no administration in time range)  ceFEPIme (MAXIPIME) 2 g in sodium chloride 0.9 % 100 mL IVPB (0 g Intravenous Stopped 08/25/20 1359)    ED Course  I have reviewed the triage vital signs and the nursing notes.  Pertinent labs & imaging results that were available during my care of the patient were reviewed by me and considered in my medical decision making (see chart for details).  Clinical Course as of Aug 25 1517  Wynelle Link Aug 25, 2020  1404 86 year  old male with recent COVID admission 10/3-10/5, discharged back to his independent living apartment on room air feeling better. Patient today with sat of 80% on room air on EMS arrival, tachypenic, febrile. On exam, appears ill, lung exam unremarkable although does not take deep breaths. Patient reports dark stools for the past 2 days, is anticoagulated, hemoccult positive. Hemoglobin not significantly changed from prior. CBC with elevated WBC to 22k, possibly due to steroids vs pna, elevated lactate at 3.6, consider infection vs respiratory state, will cover with antibiotics for possible hcap. IV fluids not given per sepsis orders due to history of CHF. CXR pending. Patient is feeling better after 2L [place], variable O2 readings with pulse ox on the ear lobe reading around low 90s.  Discussed with internal medicine service who will consult for admission.  Case discussed with Dr. Dalene Seltzer, ER attending who has seen the patient and agrees with plan of care.    [LM]  1410 Wendelin Bradt was evaluated in Emergency Department on 08/25/2020 for the symptoms described in the history of present illness. He was evaluated in the context of the global COVID-19 pandemic, which necessitated consideration that the patient might be at risk for infection with the SARS-CoV-2 virus that causes COVID-19. Institutional protocols and algorithms that pertain to the evaluation of patients at risk for COVID-19 are in a state of rapid change based on information released by regulatory bodies including the CDC and federal and state organizations. These policies and algorithms were followed during the patient's care in the ED.     [LM]    Clinical Course User Index [LM] Alden Hipp   MDM Rules/Calculators/A&P                          Final Clinical Impression(s) / ED Diagnoses Final diagnoses:  COVID-19  Gastrointestinal hemorrhage, unspecified gastrointestinal hemorrhage type  Hypoxemia  Sepsis, due to  unspecified organism, unspecified whether acute organ dysfunction present [facility])  Community acquired pneumonia, unspecified laterality    Rx / DC Orders ED Discharge Orders    None       Jeannie Fend, PA-C 08/25/20 1518    Alvira Monday, MD 08/27/20 1528

## 2020-08-25 NOTE — ED Triage Notes (Addendum)
Pt arrives to triage in respiratory distress via [facility] EMS from home/assisted living apartment.  Reports SOB.  Sats 80% on EMS arrival.  Up to 97% on NRB @ 15L.  Pt denies pain.  Speaking in short phrases with accessory muscle use.   Pt to ED 1 week ago with SOB after 3rd COVID shot and tested + for COVID.  Pt to treatment room.

## 2020-08-25 NOTE — ED Notes (Signed)
Placed pt on 2L 02 per [place]. 

## 2020-08-26 DIAGNOSIS — U071 COVID-19: Secondary | ICD-10-CM

## 2020-08-26 LAB — GLUCOSE, CAPILLARY
Glucose-Capillary: 96 mg/dL (ref 70–99)
Glucose-Capillary: 95 mg/dL (ref 70–99)
Glucose-Capillary: 100 mg/dL — ABNORMAL HIGH (ref 70–99)
Glucose-Capillary: 133 mg/dL — ABNORMAL HIGH (ref 70–99)
Glucose-Capillary: 212 mg/dL — ABNORMAL HIGH (ref 70–99)
Glucose-Capillary: 206 mg/dL — ABNORMAL HIGH (ref 70–99)

## 2020-08-26 LAB — CBC WITH DIFFERENTIAL/PLATELET
Abs Immature Granulocytes: 0.29 10*3/uL — ABNORMAL HIGH (ref 0.00–0.07)
Basophils Absolute: 0 10*3/uL (ref 0.0–0.1)
Basophils Relative: 0 %
Eosinophils Absolute: 0 10*3/uL (ref 0.0–0.5)
Eosinophils Relative: 0 %
HCT: 37.4 % — ABNORMAL LOW (ref 39.0–52.0)
Hemoglobin: 11.7 g/dL — ABNORMAL LOW (ref 13.0–17.0)
Immature Granulocytes: 1 %
Lymphocytes Relative: 1 %
Lymphs Abs: 0.2 10*3/uL — ABNORMAL LOW (ref 0.7–4.0)
MCH: 29.3 pg (ref 26.0–34.0)
MCHC: 31.3 g/dL (ref 30.0–36.0)
MCV: 93.5 fL (ref 80.0–100.0)
Monocytes Absolute: 1.3 10*3/uL — ABNORMAL HIGH (ref 0.1–1.0)
Monocytes Relative: 5 %
Neutro Abs: 24.9 10*3/uL — ABNORMAL HIGH (ref 1.7–7.7)
Neutrophils Relative %: 93 %
Platelets: 218 10*3/uL (ref 150–400)
RBC: 4 MIL/uL — ABNORMAL LOW (ref 4.22–5.81)
RDW: 14.6 % (ref 11.5–15.5)
WBC: 26.7 10*3/uL — ABNORMAL HIGH (ref 4.0–10.5)
nRBC: 0 % (ref 0.0–0.2)

## 2020-08-26 LAB — COMPREHENSIVE METABOLIC PANEL
ALT: 17 U/L (ref 0–44)
AST: 16 U/L (ref 15–41)
Albumin: 2.2 g/dL — ABNORMAL LOW (ref 3.5–5.0)
Alkaline Phosphatase: 99 U/L (ref 38–126)
Anion gap: 12 (ref 5–15)
BUN: 50 mg/dL — ABNORMAL HIGH (ref 8–23)
CO2: 22 mmol/L (ref 22–32)
Calcium: 8 mg/dL — ABNORMAL LOW (ref 8.9–10.3)
Chloride: 103 mmol/L (ref 98–111)
Creatinine, Ser: 1.53 mg/dL — ABNORMAL HIGH (ref 0.61–1.24)
GFR, Estimated: 39 mL/min — ABNORMAL LOW (ref >60)
Glucose, Bld: 102 mg/dL — ABNORMAL HIGH (ref 70–99)
Potassium: 3.9 mmol/L (ref 3.5–5.1)
Sodium: 137 mmol/L (ref 135–145)
Total Bilirubin: 1.5 mg/dL — ABNORMAL HIGH (ref 0.3–1.2)
Total Protein: 5.9 g/dL — ABNORMAL LOW (ref 6.5–8.1)

## 2020-08-26 LAB — PHOSPHORUS: Phosphorus: 3.5 mg/dL (ref 2.5–4.6)

## 2020-08-26 LAB — FERRITIN: Ferritin: 1287 ng/mL — ABNORMAL HIGH (ref 24–336)

## 2020-08-26 LAB — C-REACTIVE PROTEIN: CRP: 21.4 mg/dL — ABNORMAL HIGH (ref <1.0)

## 2020-08-26 LAB — MAGNESIUM: Magnesium: 2 mg/dL (ref 1.7–2.4)

## 2020-08-26 LAB — D-DIMER, QUANTITATIVE: D-Dimer, Quant: 1.16 ug/mL-FEU — ABNORMAL HIGH (ref 0.00–0.50)

## 2020-08-26 MED ORDER — APIXABAN 2.5 MG PO TABS
2.5000 mg | ORAL_TABLET | Freq: Two times a day (BID) | ORAL | Status: DC
  Administered 2020-08-26 – 2020-08-30 (×9): 2.5 mg via ORAL
  Filled 2020-08-26 (×9): qty 1

## 2020-08-26 MED ORDER — SODIUM CHLORIDE 0.9 % IV SOLN
3.0000 g | Freq: Three times a day (TID) | INTRAVENOUS | Status: DC
  Administered 2020-08-26 – 2020-08-30 (×12): 3 g via INTRAVENOUS
  Filled 2020-08-26 (×2): qty 8
  Filled 2020-08-26 (×2): qty 3
  Filled 2020-08-26 (×2): qty 8
  Filled 2020-08-26: qty 3
  Filled 2020-08-26: qty 8
  Filled 2020-08-26: qty 3
  Filled 2020-08-26: qty 8
  Filled 2020-08-26: qty 3
  Filled 2020-08-26 (×2): qty 8
  Filled 2020-08-26: qty 3
  Filled 2020-08-26: qty 8
  Filled 2020-08-26: qty 3

## 2020-08-26 MED ORDER — SODIUM CHLORIDE 0.9 % IV SOLN: 2.0000 g | Freq: Two times a day (BID) | INTRAVENOUS | Status: DC

## 2020-08-26 NOTE — Progress Notes (Signed)
Subjective:   [name] is a 86 year old man with past medical history of recent COVID-19 infection for which he was admitted to the IMTS 08/18/20-08/20/20 for treatment, CKD3, diastolic CHF (last EF 50-55% 11/2016), HTN, and persistent atrial fibrillation (on Eliquis) who presented to the [facility] for evaluation of shortness of breath and admitted for Covid 19 pneumonia with superimposed bacterial pneumonia  Overnight, no acute events.  This morning, he reports that he feels better than he has for the past few days. He endorses infrequent cough and states that he feels tired because he was just woken up. He denies dizziness, lightheadedness, abdominal pain, nausea, vomiting, fevers, chills.  Objective:  Vital signs in last 24 hours: Vitals:   08/25/20 1900 08/25/20 1958 08/26/20 0001 08/26/20 0350  BP:  112/64 101/72 96/73  Pulse:  100 99 99  Resp:  20 16 20   Temp:  98 F (36.7 C) 99.1 F (37.3 C) 98.1 F (36.7 C)  TempSrc:  Oral Axillary Axillary  SpO2: 100% 100% 100% 99%  Weight: 86.2 kg     Height: 6' (1.829 m)      SpO2: 99 % O2 Flow Rate (L/min): 2 L/min   Intake/Output Summary (Last 24 hours) at 08/26/2020 0612 Last data filed at 08/26/2020 0300 Gross per 24 hour  Intake 434.65 ml  Output --  Net 434.65 ml   Physical Exam Vitals reviewed.  Constitutional:      General: He is not in acute distress.    Appearance: Normal appearance.  HENT:     Head: Normocephalic and atraumatic.     Mouth/Throat:     Mouth: Mucous membranes are dry.     Pharynx: Oropharynx is clear.  Eyes:     Extraocular Movements: Extraocular movements intact.     Conjunctiva/sclera: Conjunctivae normal.  Cardiovascular:     Rate and Rhythm: Normal rate. Rhythm irregular.     Pulses: Normal pulses.     Heart sounds: Normal heart sounds.  Pulmonary:     Effort: Pulmonary effort is normal. No respiratory distress.     Breath sounds: Normal breath sounds.  Abdominal:      General: Abdomen is flat. Bowel sounds are normal.     Palpations: Abdomen is soft.     Tenderness: There is no abdominal tenderness.  Musculoskeletal:        General: Normal range of motion.     Cervical back: Normal range of motion and neck supple.     Right lower leg: No edema.     Left lower leg: No edema.  Skin:    General: Skin is warm and dry.     Capillary Refill: Capillary refill takes less than 2 seconds.  Neurological:     General: No focal deficit present.     Mental Status: He is alert. Mental status is at baseline.  Psychiatric:        Mood and Affect: Mood normal.        Behavior: Behavior normal.        Thought Content: Thought content normal.        Judgment: Judgment normal.     CBC Latest Ref Rng & Units 08/26/2020 08/25/2020 08/20/2020  WBC 4.0 - 10.5 K/uL 26.7(H) 22.3(H) 7.7  Hemoglobin 13.0 - 17.0 g/dL 11.7(L) 13.6 11.5(L)  Hematocrit 39 - 52 % 37.4(L) 45.0 37.0(L)  Platelets 150 - 400 K/uL 218 314 294   CMP Latest Ref Rng & Units 08/26/2020 08/25/2020 08/20/2020  Glucose  70 - 99 mg/dL 902(I) 097(D) 532(D)  BUN 8 - 23 mg/dL 92(E) 26(S) 34(H)  Creatinine 0.61 - 1.24 mg/dL 9.62(I) 2.97(L) 8.92(J)  Sodium 135 - 145 mmol/L 137 139 139  Potassium 3.5 - 5.1 mmol/L 3.9 4.3 4.5  Chloride 98 - 111 mmol/L 103 101 101  CO2 22 - 32 mmol/L 22 23 27   Calcium 8.9 - 10.3 mg/dL 8.0(L) 8.6(L) 8.8(L)  Total Protein 6.5 - 8.1 g/dL 5.9(L) 6.5 5.6(L)  Total Bilirubin 0.3 - 1.2 mg/dL ) 1.9(E) 0.9  Alkaline Phos 38 - 126 U/L 99 118 110  AST 15 - 41 U/L 16 24 18   ALT 0 - 44 U/L 17 27 20    Phos - 3.5 Mg - 2.0 Ferritin - 1,287 D-dimer - 1.16 CRP - 21.4 MRSA - negative POC occult blood - positive  Assessment/Plan:  Active Problems:   Pneumonia due to COVID-19 virus  Mr. Kaidyn Javid is a 86 year old man with past medical history of recent COVID-19 infection for which he was admitted to the IMTS 08/18/20-08/20/20 for treatment, CKD3, diastolic CHF (last EF  50-55% 99), HTN, and persistent atrial fibrillation (on Eliquis) who presented to the [facility] for evaluation of shortness of breath and admitted for Covid 19 pneumonia with superimposed bacterial pneumonia  Acute hypoxic respiratory failure secondary to COVID-19 pneumonia with Likely 2/2 Superimposed Bacterial Pneumonia  Patient tested positive for COVID-19 on 10/03. While admitted, he is saturating well on 2L O2 via [place]. Inflammatory markers are elevated on arrival to the ED, with a procal of 3.34, slightly elevated temp of 100.5. His chest radiograph reveals bilateral pneumonia. The likely etiology of his gradual worsening of his overall condition is superimposed bacterial pneumonia in association of his COVID vs aspiration pneumonia. He was started on vancomycin per pharmacy as well as cefepime, given his recent hospital admission making HCAP coverage necessary. As his MRSA swab was negative, vancomycin can be discontinued and cefepime can be transitioned to Unasyn. Although he has elevated inflammatory markers, we will not initiate baricitinib in the setting of superimposed bacterial pneumonia. -Discontinue Vancomycin per pharmacy -Discontinue Cefepime  -Start Unasyn -Continue remdesivir (day 2/5) -Continue dexamethasone (day 2/10)  -6U Lantus, sensitive SSI -currently on 2 L [place]; wean as able -IS, flutter valve  -antitussives -self prone when able  -Trend inflammatory markers daily -Monitor vitals  Guaiac-positive stools Patient with report of one large black stool following discharge from prior hospitalization. Fecal occult positive, however hemoglobin stable. Eliquis was held one day, however we will restart this medication in the setting of his atrial fibrillation and hypercoagulable state secondary to COVID infection.  -Restart Eliquis  Persistent atrial fibrillation, chronic Patient in atrial fibrillation upon admission. Prescribed apixaban 2.5 mg twice daily for  anticoagulation. -Continue Cardizem 240 mg daily  -Continue Eliquis 2.5mg  twice daily  HTN, chronic On diltiazem 240 mg daily and Lasix 40 mg daily at home. -Continue home diltiazem 240 mg daily  Heart failure with preserved ejection fraction, chronic Patient has diastolic congestive heart failure with last EF of 50-55% in January 2018. Prescribed Lasix 40 mg daily. Euvolemic on exam. -Hold home Lasix 40 mg daily -Continue Cardizem  Diet: Carb/Renal VTE: Eliquis IVF: None Code: Full  Prior to Admission Living Arrangement: Home, ALF Anticipated Discharge Location: Home, ALF Barriers to Discharge: Continued medical workup Dispo: Anticipated discharge in approximately 2 day(s).  12/03, MD 08/26/2020, 6:10 AM Pager: [phone] After 5pm on weekdays and 1pm on weekends: On Call pager [phone]

## 2020-08-26 NOTE — Plan of Care (Signed)
Patient is currently resting in bed. Denies pain. VSS. Remains on 2L [place]. HR elevated, Cardizem capsule given, HR less elevated. Patient forgetful, trouble seeing. Call bell within reach. Bed alarm on. Floor mats in place. Frequently rounded on.   Problem: Education: Goal: Knowledge of General Education information will improve Description: Including pain rating scale, medication(s)/side effects and non-pharmacologic comfort measures Outcome: Progressing   Problem: Health Behavior/Discharge Planning: Goal: Ability to manage health-related needs will improve Outcome: Progressing   Problem: Clinical Measurements: Goal: Ability to maintain clinical measurements within normal limits will improve Outcome: Progressing Goal: Will remain free from infection Outcome: Progressing Goal: Diagnostic test results will improve Outcome: Progressing Goal: Respiratory complications will improve Outcome: Progressing Goal: Cardiovascular complication will be avoided Outcome: Progressing   Problem: Activity: Goal: Risk for activity intolerance will decrease Outcome: Progressing   Problem: Nutrition: Goal: Adequate nutrition will be maintained Outcome: Progressing   Problem: Coping: Goal: Level of anxiety will decrease Outcome: Progressing   Problem: Elimination: Goal: Will not experience complications related to bowel motility Outcome: Progressing Goal: Will not experience complications related to urinary retention Outcome: Progressing   Problem: Pain Managment: Goal: General experience of comfort will improve Outcome: Progressing   Problem: Safety: Goal: Ability to remain free from injury will improve Outcome: Progressing   Problem: Skin Integrity: Goal: Risk for impaired skin integrity will decrease Outcome: Progressing

## 2020-08-26 NOTE — Progress Notes (Signed)
Patient arrived to unit a/o/v. Patient oriented to room, he denies pain at this time. Bed in low position, personal items within reach. Will continue to monitor.

## 2020-08-26 NOTE — Progress Notes (Signed)
Date: 08/26/2020  Patient name: Andrew Salas  Medical record number: [id number]  Date of birth: 1926-07-25   I have seen and evaluated Andrew Salas and discussed their care with the Residency Team.  In brief, patient is a 86 year old male with a past medical history of recent COVID-19 infection for which he was admitted to our service from October 3 to October 5, CKD stage IIIa, chronic diastolic heart failure, hypertension, persistent A. fib on anticoagulation who presented with worsening shortness of breath over the last 2 to 3 days.  Patient states that he initially felt well when he went home until approximately 2 days prior to admission when he began to have worsening shortness of breath and fevers at home.  He was requiring more oxygen at home and came to the ED for further evaluation.  Patient also complained of one episode of diarrhea 3 to 4 days ago which he stated was black in color.  He also complains of worsening cough which is productive of yellowish phlegm.  No headache, no nausea or vomiting, no chest pain, no palpitations, no diaphoresis, no lightheadedness, no syncope.  Today, patient states that he feels better and that shortness of breath is improved.  He still has some mild cough but no nausea or vomiting or diarrhea.  PMHx, Fam Hx, and/or Soc Hx : As per resident admit note  Vitals:   08/26/20 0350 08/26/20 0700  BP: 96/73 118/70  Pulse: 99 94  Resp: 20 18  Temp: 98.1 F (36.7 C) 98 F (36.7 C)  SpO2: 99% 99%   General: Awake, alert, x3, NAD CVs: Irregularly irregular Lungs: CTA bilaterally, no crackles or wheezes noted Abdomen: Soft, nontender, nondistended, normoactive bowel sounds Extremities: No edema noted, nontender to palpation Psych: Normal affect HEENT: Normocephalic, atraumatic Skin: Warm and dry  Assessment and Plan: I have seen and evaluated the patient as outlined above. I agree with the formulated Assessment and Plan as detailed in  the residents' note, with the following changes:   1.  Acute hypoxic respiratory failure likely secondary to superimposed bacterial pneumonia on underlying Covid infection: -Patient presented to the ED with worsening shortness of breath after being discharged from [facility] for recent diagnosis of COVID-19 infection.  However, patient was noted to have an elevated procalcitonin, associated fevers and worsening leukocytosis up to the 20s.  His chest x-ray showed bilateral pneumonia but appeared to have more consolidation in his right lower lobe.  I suspect that the patient has superimposed bacterial pneumonia now which is responsible for the acute worsening of his symptoms. -Patient's MRSA swab was negative.  Will DC vancomycin -We will transition the patient to Unasyn for now.  I suspect the patient may have aspirated given worsening consolidation of his right lower lobe on my interpretation of his x-ray -We will complete a 5-day course of remdesivir and continue dexamethasone to complete a 10-day course.  We will hold off on baricitinib given possible superimposed bacterial infection -We will continue to trend inflammatory markers daily -No further work-up at this time  2.  Guaiac positive stools: -Patient reported 1 episode of of a large black stool 3 to 4 days ago.  Patient was noted to be fecal occult positive however his hemoglobin is stable.  Given that he was recently diagnosed with COVID-19 which is a hypercoagulable state I would err on the side of restarting his Eliquis and monitoring his hemoglobin closely. -If patient has recurrent black stools and decreasing  hemoglobin would obtain GI consult for further evaluation but will hold off at this time  Earl Lagos, MD 10/11/202112:16 PM

## 2020-08-26 NOTE — Progress Notes (Signed)
Transferred pt to 5N-27 via bed. No complaints of pain or discomfort.  All pt clothes and belonging accompanied pt to his new location.  Pt was told prior to the transfer that he would he transferring out and he was in agreement..  Report called to the nurse. Oxygen on 25 liters, IV x2 intact SL.

## 2020-08-26 NOTE — Plan of Care (Signed)
  Problem: Education: Goal: Knowledge of General Education information will improve Description Including pain rating scale, medication(s)/side effects and non-pharmacologic comfort measures Outcome: Progressing   

## 2020-08-26 NOTE — Progress Notes (Signed)
   08/25/20 1958  Assess: MEWS Score  Temp 98 F (36.7 C)  BP 112/64  Pulse Rate 100  ECG Heart Rate (!) 112  Resp 20  Level of Consciousness Alert  SpO2 100 %  O2 Device Nasal Cannula  O2 Flow Rate (L/min) 2 L/min  Assess: MEWS Score  MEWS Temp 0  MEWS Systolic 0  MEWS Pulse 2  MEWS RR 0  MEWS LOC 0  MEWS Score 2  MEWS Score Color Yellow  Assess: if the MEWS score is Yellow or Red  Were vital signs taken at a resting state? Yes  Focused Assessment No change from prior assessment  Early Detection of Sepsis Score *See Row Information* High  MEWS guidelines implemented *See Row Information* No, vital signs rechecked  Treat  MEWS Interventions Escalated (See documentation below)  Pain Scale 0-10  Pain Score 0  Escalate  MEWS: Escalate Yellow: discuss with charge nurse/RN and consider discussing with provider and RRT  Notify: Charge Nurse/RN  Name of Charge Nurse/RN Notified Nikki, RN  Date Charge Nurse/RN Notified 08/25/20  Time Charge Nurse/RN Notified 2029  Notify: Provider  Provider Name/Title Imogene Burn (IM intern)  Date Provider Notified 08/25/20  Time Provider Notified 2029  Notification Type Page  Notification Reason Other (Comment) (HR elevated, hx afib- patient eliquis not ordered)  Response Other (Comment) (will put orders in as soon as possible)  Date of Provider Response 08/25/20  Time of Provider Response 2035  Document  Patient Outcome Stabilized after interventions  Progress note created (see row info) Yes

## 2020-08-26 NOTE — Progress Notes (Addendum)
Pharmacy Antibiotic Note  Andrew Salas is a 86 y.o. male admitted on 08/25/2020 with pneumonia and sepsis.  Pharmacy has been consulted for Vancomycin dosing. Pt also given Cefepime in ED. Pt with recent hospitalization (d/c 10/5) for COVID.  Remdesivir given 10/3>>10/4 during last hospitalization and now want to restart, will not reload - just 100mg  x 5 days 10/10>>10/14  Pt got a dose of cefepime in the ED. Plan to add Unasyn in addition to vanc for PNA. CXR showed small pleural effusion.   Scr down 1.53>>CrCl 34 ml/min MRSA PCR neg - vanc/cefepime changed to Unasyn  Plan: Unasyn 3g IV q8 Remdesivir 100mg  daily x 5 days   Height: 6' (182.9 cm) Weight: 86.2 kg (190 lb 0.6 oz) IBW/kg (Calculated) : 77.6  Temp (24hrs), Avg:98.6 F (37 C), Min:97.6 F (36.4 C), Max:100.5 F (38.1 C)  Recent Labs  Lab 08/20/20 0449 08/25/20 1137 08/26/20 0434  WBC 7.7 22.3* 26.7*  CREATININE 1.61* 1.67* 1.53*  LATICACIDVEN  --  3.6*  --     Estimated Creatinine Clearance: 33.8 mL/min (A) (by C-G formula based on SCr of 1.53 mg/dL (H)).    No Known Allergies  Antimicrobials this admission: 10/10 Cefepime x 1 10/10 Vanc >> 10/11 10/11 Unasyn>> Microbiology results: 10/10 BCx: ngtd  10/26/20, PharmD, BCIDP, AAHIVP, CPP Infectious Disease Pharmacist 08/26/2020 11:08 AM

## 2020-08-26 NOTE — TOC Initial Note (Signed)
Transition of Care [facility]) - Initial/Assessment Note    Patient Details  Name: Andrew Salas MRN: [id number] Date of Birth: [birth year]  Transition of Care [facility] CM/SW Contact:    Nance Pear, RN Phone Number: 08/26/2020, 1:00 PM  Clinical Narrative:                 Case manager called patient on phone due to COVID.  Patient receives his VA benefits through [facility].  Could not call VA today due to [place] day offices closed.  High risk assessment complete.  Patient receives his care and medications through the [place].  PCP Dr. Foye Deer.  Address verified.  Lives in apartment complex, very social, lots of friends to check on him.  Family lives in [place].  Prior to admission patient states was very independent, does have rolling walker, scooter for mobility.  Obtained from chart:  Home health through [facility] from [place] benefits, [place] [facility] [phone], [phone].    Expected Discharge Plan: Home w Home Health Services Barriers to Discharge: Continued Medical Work up   Patient Goals and CMS Choice Patient states their goals for this hospitalization and ongoing recovery are:: to go home      Expected Discharge Plan and Services Expected Discharge Plan: Home w Home Health Services   Discharge Planning Services: CM Consult   Living arrangements for the past 2 months: Apartment                                      Prior Living Arrangements/Services Living arrangements for the past 2 months: Apartment Lives with:: Self Patient language and need for interpreter reviewed:: Yes Do you feel safe going back to the place where you live?: Yes      Need for Family Participation in Patient Care: Yes (Comment) Care giver support system in place?: Yes (comment)   Criminal Activity/Legal Involvement Pertinent to Current Situation/Hospitalization: No - Comment as needed  Activities of Daily Living Home Assistive Devices/Equipment: None ADL Screening  (condition at time of admission) Patient's cognitive ability adequate to safely complete daily activities?: Yes Is the patient deaf or have difficulty hearing?: No Does the patient have difficulty seeing, even when wearing glasses/contacts?: No Does the patient have difficulty concentrating, remembering, or making decisions?: No Patient able to express need for assistance with ADLs?: Yes Does the patient have difficulty dressing or bathing?: No Independently performs ADLs?: Yes (appropriate for developmental age) Communication: Independent Dressing (OT): Independent Grooming: Independent Feeding: Independent Bathing: Appropriate for developmental age, Needs assistance Is this a change from baseline?: Pre-admission baseline Toileting: Independent, Needs assistance Is this a change from baseline?: Change from baseline, expected to last <3 days In/Out Bed: Needs assistance Is this a change from baseline?: Change from baseline, expected to last <3 days Walks in Home: Independent with device (comment) Is this a change from baseline?: Change from baseline, expected to last <3 days Does the patient have difficulty walking or climbing stairs?: Yes Weakness of Legs: Both Weakness of Arms/Hands: None  Permission Sought/Granted Permission sought to share information with : Case Manager                Emotional Assessment   Attitude/Demeanor/Rapport: Engaged Affect (typically observed): Appropriate Orientation: : Oriented to Self, Oriented to Place, Oriented to  Time, Oriented to Situation Alcohol / Substance Use: Not Applicable Psych Involvement: No (comment)  Admission diagnosis:  Hypoxemia [R09.02] Gastrointestinal hemorrhage, unspecified gastrointestinal hemorrhage type [K92.2] Community acquired pneumonia, unspecified laterality [J18.9] Sepsis, due to unspecified organism, unspecified whether acute organ dysfunction present (HCC) [A41.9] Pneumonia due to COVID-19 virus [U07.1,  J12.82] COVID-19 [U07.1] Patient Active Problem List   Diagnosis Date Noted  . Pneumonia due to COVID-19 virus 08/25/2020  . COVID-19 08/18/2020  . CKD (chronic kidney disease) 12/24/2016  . Diastolic congestive heart failure (HCC) 12/24/2016  . Cardiomyopathy (HCC) 12/16/2016  . Hypertensive heart disease with heart failure (HCC) 12/16/2016  . 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 Near syncope 11/28/2016  . Hematoma of right flank 11/28/2016  . Hyperlipidemia 11/28/2016  . Abscess in epidural space of lumbar spine 11/17/2016  . Abnormal CXR   . Discitis   . PNA (pneumonia)   . Meningitis 11/11/2016  . Permanent atrial fibrillation (HCC) 11/11/2016  . Obesity hypoventilation syndrome (HCC), presumed 11/11/2016  . UTI (urinary tract infection) 11/11/2016  . Sepsis (HCC) 11/11/2016  . Osteoarthritis of right knee 02/05/2012    Class: End Stage   PCP:  Paulina Fusi, MD Pharmacy:   [facility] [phone] - [place], [place] - [address] [facility] [place] [address] Phone: [phone] Fax: [phone]  [facility] [place], [place] - [address] [id number] [facility] [address] [place] [address] Phone: [phone] Fax: [phone]  [facility] [place], [place] - [address] [place] [address] Phone: [phone] Fax: [phone]     Social Determinants of Health (SDOH) Interventions    Readmission Risk Interventions Readmission Risk Prevention Plan 08/19/2020  Transportation Screening Complete  PCP or Specialist Appt within 5-7 Days Complete  Home Care Screening Complete  Medication Review (RN CM) Complete  Some recent data might be hidden

## 2020-08-27 DIAGNOSIS — U071 COVID-19: Secondary | ICD-10-CM | POA: Diagnosis not present

## 2020-08-27 LAB — CBC WITH DIFFERENTIAL/PLATELET
Abs Immature Granulocytes: 0.23 10*3/uL — ABNORMAL HIGH (ref 0.00–0.07)
Basophils Absolute: 0 10*3/uL (ref 0.0–0.1)
Basophils Relative: 0 %
Eosinophils Absolute: 0 10*3/uL (ref 0.0–0.5)
Eosinophils Relative: 0 %
HCT: 39 % (ref 39.0–52.0)
Hemoglobin: 12.1 g/dL — ABNORMAL LOW (ref 13.0–17.0)
Immature Granulocytes: 1 %
Lymphocytes Relative: 1 %
Lymphs Abs: 0.2 10*3/uL — ABNORMAL LOW (ref 0.7–4.0)
MCH: 29.2 pg (ref 26.0–34.0)
MCHC: 31 g/dL (ref 30.0–36.0)
MCV: 94 fL (ref 80.0–100.0)
Monocytes Absolute: 1.4 10*3/uL — ABNORMAL HIGH (ref 0.1–1.0)
Monocytes Relative: 6 %
Neutro Abs: 21.7 10*3/uL — ABNORMAL HIGH (ref 1.7–7.7)
Neutrophils Relative %: 92 %
Platelets: 211 10*3/uL (ref 150–400)
RBC: 4.15 MIL/uL — ABNORMAL LOW (ref 4.22–5.81)
RDW: 14.4 % (ref 11.5–15.5)
WBC: 23.5 10*3/uL — ABNORMAL HIGH (ref 4.0–10.5)
nRBC: 0 % (ref 0.0–0.2)

## 2020-08-27 LAB — COMPREHENSIVE METABOLIC PANEL
ALT: 19 U/L (ref 0–44)
AST: 15 U/L (ref 15–41)
Albumin: 2.1 g/dL — ABNORMAL LOW (ref 3.5–5.0)
Alkaline Phosphatase: 98 U/L (ref 38–126)
Anion gap: 14 (ref 5–15)
BUN: 56 mg/dL — ABNORMAL HIGH (ref 8–23)
CO2: 23 mmol/L (ref 22–32)
Calcium: 8.2 mg/dL — ABNORMAL LOW (ref 8.9–10.3)
Chloride: 102 mmol/L (ref 98–111)
Creatinine, Ser: 1.45 mg/dL — ABNORMAL HIGH (ref 0.61–1.24)
GFR, Estimated: 42 mL/min — ABNORMAL LOW (ref >60)
Glucose, Bld: 115 mg/dL — ABNORMAL HIGH (ref 70–99)
Potassium: 3.6 mmol/L (ref 3.5–5.1)
Sodium: 139 mmol/L (ref 135–145)
Total Bilirubin: 0.7 mg/dL (ref 0.3–1.2)
Total Protein: 5.8 g/dL — ABNORMAL LOW (ref 6.5–8.1)

## 2020-08-27 LAB — GLUCOSE, CAPILLARY
Glucose-Capillary: 113 mg/dL — ABNORMAL HIGH (ref 70–99)
Glucose-Capillary: 117 mg/dL — ABNORMAL HIGH (ref 70–99)
Glucose-Capillary: 120 mg/dL — ABNORMAL HIGH (ref 70–99)
Glucose-Capillary: 184 mg/dL — ABNORMAL HIGH (ref 70–99)
Glucose-Capillary: 208 mg/dL — ABNORMAL HIGH (ref 70–99)

## 2020-08-27 LAB — FERRITIN: Ferritin: 1463 ng/mL — ABNORMAL HIGH (ref 24–336)

## 2020-08-27 LAB — MAGNESIUM: Magnesium: 2.2 mg/dL (ref 1.7–2.4)

## 2020-08-27 LAB — C-REACTIVE PROTEIN: CRP: 18.2 mg/dL — ABNORMAL HIGH (ref <1.0)

## 2020-08-27 LAB — D-DIMER, QUANTITATIVE: D-Dimer, Quant: 1.71 ug/mL-FEU — ABNORMAL HIGH (ref 0.00–0.50)

## 2020-08-27 LAB — PHOSPHORUS: Phosphorus: 3.7 mg/dL (ref 2.5–4.6)

## 2020-08-27 MED ORDER — PRAVASTATIN SODIUM 40 MG PO TABS
40.0000 mg | ORAL_TABLET | Freq: Every day | ORAL | Status: DC
  Administered 2020-08-27 – 2020-08-29 (×3): 40 mg via ORAL
  Filled 2020-08-27 (×3): qty 1

## 2020-08-27 NOTE — Consult Note (Signed)
   [facility] CM Inpatient Consult   08/27/2020  Kamaal Cast 1926/02/14 [id number]   [facility] [[facility]]  Accountable Care Organization [ACO] Patient: [facility] [employer] [facility]  Screened for readmission less than 7 days and assess for disposition needs.  PCP: Foye Deer, MD  Plan: Continue to follow for progress and needs for community transition if appropriate.  For questions,  Charlesetta Shanks, RN BSN CCM [facility]  [phone] business mobile phone Toll free office [phone]  Fax number: [phone] [email or web address]

## 2020-08-27 NOTE — Progress Notes (Signed)
Patient o2 sat at 80% on 2Ls, o2 increased to 4Ls sat at 96%. MD made aware. Will continue to monitor.

## 2020-08-27 NOTE — Progress Notes (Signed)
Subjective:   Andrew Salas is a 86 year old man with past medical history of recent COVID-19 infection for which he was admitted to the IMT[facility] 08/18/20-08/20/20 for treatment, CKD3, diastolic CHF (last EF 50-55% 11/2016), HTN, and persistent atrial fibrillation (on Eliquis) who presented to the [facility] for evaluation of shortness of breath and admitted for Covid 19 pneumonia with superimposed bacterial pneumonia  Overnight, no acute events.  This morning, he reports that he feels well and denies any complaints. He states that his shortness of breath and cough are much improved and he no longer feels as weak. He denies nausea, vomiting or diarrhea and reports that he has been eating and drinking well.  Objective:  Vital signs in last 24 hours: Vitals:   08/27/20 0736 08/27/20 1422 08/27/20 1441 08/27/20 1443  BP: 121/81 114/74 109/74   Pulse: 84 95 91   Resp: 20 (!) 22 (!) 26 (!) 21  Temp: (!) 97.5 F (36.4 C) (!) 97.2 F (36.2 C)    TempSrc: Oral Oral    SpO2: 96%  93%   Weight:      Height:       SpO2: 93 % O2 Flow Rate (L/min): 2 L/min Filed Weights   08/25/20 1900  Weight: 86.2 kg    Intake/Output Summary (Last 24 hours) at 08/27/2020 1525 Last data filed at 08/27/2020 1100 Gross per 24 hour  Intake 480 ml  Output 1250 ml  Net -770 ml   Physical Exam Vitals reviewed.  Constitutional:      General: He is not in acute distress.    Appearance: Normal appearance.  HENT:     Head: Normocephalic and atraumatic.     Mouth/Throat:     Mouth: Mucous membranes are moist.     Pharynx: Oropharynx is clear.  Eyes:     Extraocular Movements: Extraocular movements intact.     Conjunctiva/sclera: Conjunctivae normal.  Cardiovascular:     Rate and Rhythm: Normal rate. Rhythm irregular.     Pulses: Normal pulses.     Heart sounds: Normal heart sounds.  Pulmonary:     Effort: Pulmonary effort is normal. No respiratory distress.     Breath sounds: Normal breath  sounds.  Abdominal:     General: Abdomen is flat. Bowel sounds are normal.     Palpations: Abdomen is soft.     Tenderness: There is no abdominal tenderness.  Musculoskeletal:        General: Normal range of motion.     Cervical back: Normal range of motion and neck supple.     Right lower leg: No edema.     Left lower leg: No edema.  Skin:    General: Skin is warm and dry.     Capillary Refill: Capillary refill takes less than 2 seconds.  Neurological:     General: No focal deficit present.     Mental Status: He is alert. Mental status is at baseline.  Psychiatric:        Mood and Affect: Mood normal.        Behavior: Behavior normal.        Thought Content: Thought content normal.        Judgment: Judgment normal.    CBC Latest Ref Rng & Units 08/27/2020 08/26/2020 08/25/2020  WBC 4.0 - 10.5 K/uL 23.5(H) 26.7(H) 22.3(H)  Hemoglobin 13.0 - 17.0 g/dL 12.1(L) 11.7(L) 13.6  Hematocrit 39 - 52 % 39.0 37.4(L) 45.0  Platelets 150 - 400 K/uL 211  218 314   CMP Latest Ref Rng & Units 08/27/2020 08/26/2020 08/25/2020  Glucose 70 - 99 mg/dL 361(W) 431(V) 400(Q)  BUN 8 - 23 mg/dL 67(Y) 19(J) 09(T)  Creatinine 0.61 - 1.24 mg/dL 2.67(T) 2.45(Y) 0.99(I)  Sodium 135 - 145 mmol/L 139 137 139  Potassium 3.5 - 5.1 mmol/L 3.6 3.9 4.3  Chloride 98 - 111 mmol/L 102 103 101  CO2 22 - 32 mmol/L 23 22 23   Calcium 8.9 - 10.3 mg/dL 8.2(L) 8.0(L) 8.6(L)  Total Protein 6.5 - 8.1 g/dL ) 5.9(L) 6.5  Total Bilirubin 0.3 - 1.2 mg/dL 0.7 3.3(A) 2.5(K)  Alkaline Phos 38 - 126 U/L 98 99 118  AST 15 - 41 U/L 15 16 24   ALT 0 - 44 U/L 19 17 27    Phos - 3.7 Mg - 2.2 Ferritin - 1,463 D-dimer - 1.71 CRP - 18.2 MRSA - negative POC occult blood - positive  No results found. Assessment/Plan:  Active Problems:   Pneumonia due to COVID-19 virus  Mr. Forney Kleinpeter is a 86 year old man with past medical history of recent COVID-19 infection for which he was admitted to the [facility]MT[facility] 08/18/20-08/20/20  for treatment, CKD3, diastolic CHF (last EF 50-55% 11/2016), HTN, and persistent atrial fibrillation (on Eliquis) who presented to the [facility] for evaluation of shortness of breath and admitted for Covid 19 pneumonia with superimposed bacterial pneumonia  Acute hypoxic respiratory failure likely secondary to superimposed bacterial pneumonia on underlying COVID infection Patient tested positive for COVID-19 on 10/03. While admitted, he is saturating well on 2L O2 via [place]. Inflammatory markers are elevated on arrival to the ED, with a procal of 3.34, slightly elevated temp of 100.5. His chest radiograph reveals bilateral pneumonia. The likely etiology of his gradual worsening of his overall condition is superimposed bacterial pneumonia in association of his COVID vs aspiration pneumonia. He was started on vancomycin per pharmacy as well as cefepime, given his recent hospital admission making HCAP coverage necessary. As his MRSA swab was negative, vancomycin can be discontinued and cefepime can be transitioned to Unasyn. Although he has elevated inflammatory markers, we will not initiate baricitinib in the setting of superimposed bacterial pneumonia. -Continue Unasyn -Continue remdesivir (day 3/5) -Continue dexamethasone (day 3/10)  -6U Lantus, sensitive SSI -currently on 2 L [place]; wean as able -IS, flutter valve  -self prone when able  -Trend inflammatory markers daily -Monitor vitals  Persistent atrial fibrillation, chronic Patient in atrial fibrillation upon admission. Prescribed apixaban 2.5 mg twice daily for anticoagulation. -Continue Cardizem 240 mg daily -Continue Eliquis 2.5mg  twice daily  Guaiac-positive stools Patient with report of one large black stool following discharge from prior hospitalization. Fecal occult positive, however hemoglobin stable. Eliquis was held one day, however this medication was restarted in the setting of his atrial fibrillation and hypercoagulable state secondary to  COVID infection. He denies any repeat incidents during hospitalization. -Continue Eliquis 2.5mg  twice daily -CBC daily  HTN, chronic On diltiazem 240 mg daily and Lasix 40 mg daily at home. -Continue home diltiazem 240 mg daily  Heart failure with preserved ejection fraction, chronic Patient has diastolic congestive heart failure with last EF of 50-55% in January 2018. Prescribed Lasix 40 mg daily. Euvolemic on exam. -Hold home Lasix 40 mg daily -Continue Cardizem  Hyperlipidemia, chronic Patient takes pravastatin 40mg  daily -Restarted home pravastatin 40mg  daily  Diet: Carb/Renal VTE: Eliquis IVF: None Code: Full Bowel Regimen: Miralax daily PRN  [facility], MD 08/27/2020, 3:25 PM Pager: [phone] After 5pm on  weekdays and 1pm on weekends: On Call pager [phone]

## 2020-08-27 NOTE — Progress Notes (Signed)
Patient has a black bag with him, he stated he have a lot of money in the bag. Nurse educated him to send funds to security for safe keeping patient refused stating " I will keep it here with me, I have a lot of money and am not worried about it" Nurse educated patient that [facility] is not responsible for items that are not sent for safe keeping. Patient verbalized understanding.

## 2020-08-27 NOTE — Progress Notes (Signed)
Nurse went in to change linens primo external catheter did not work properly.  Patient has a small black bag on bed with him.  He stated it contains important documents and large funds.  Nurse tried to encourage patient to have these items placed in security but patient refused.  He stated "I will keep it on the bed with me".  Patient has been educated that Andrew Salas is not be responsible for items not secured with security.  Patient stated he understands.

## 2020-08-28 DIAGNOSIS — U071 COVID-19: Secondary | ICD-10-CM | POA: Diagnosis not present

## 2020-08-28 LAB — COMPREHENSIVE METABOLIC PANEL
ALT: 40 U/L (ref 0–44)
AST: 32 U/L (ref 15–41)
Albumin: 2 g/dL — ABNORMAL LOW (ref 3.5–5.0)
Alkaline Phosphatase: 110 U/L (ref 38–126)
Anion gap: 11 (ref 5–15)
BUN: 58 mg/dL — ABNORMAL HIGH (ref 8–23)
CO2: 24 mmol/L (ref 22–32)
Calcium: 8.1 mg/dL — ABNORMAL LOW (ref 8.9–10.3)
Chloride: 101 mmol/L (ref 98–111)
Creatinine, Ser: 1.36 mg/dL — ABNORMAL HIGH (ref 0.61–1.24)
GFR, Estimated: 45 mL/min — ABNORMAL LOW (ref >60)
Glucose, Bld: 93 mg/dL (ref 70–99)
Potassium: 4.1 mmol/L (ref 3.5–5.1)
Sodium: 136 mmol/L (ref 135–145)
Total Bilirubin: 0.7 mg/dL (ref 0.3–1.2)
Total Protein: 5.5 g/dL — ABNORMAL LOW (ref 6.5–8.1)

## 2020-08-28 LAB — CBC WITH DIFFERENTIAL/PLATELET
Abs Immature Granulocytes: 0.17 10*3/uL — ABNORMAL HIGH (ref 0.00–0.07)
Basophils Absolute: 0 10*3/uL (ref 0.0–0.1)
Basophils Relative: 0 %
Eosinophils Absolute: 0 10*3/uL (ref 0.0–0.5)
Eosinophils Relative: 0 %
HCT: 35.7 % — ABNORMAL LOW (ref 39.0–52.0)
Hemoglobin: 11.2 g/dL — ABNORMAL LOW (ref 13.0–17.0)
Immature Granulocytes: 1 %
Lymphocytes Relative: 2 %
Lymphs Abs: 0.3 10*3/uL — ABNORMAL LOW (ref 0.7–4.0)
MCH: 29.2 pg (ref 26.0–34.0)
MCHC: 31.4 g/dL (ref 30.0–36.0)
MCV: 93.2 fL (ref 80.0–100.0)
Monocytes Absolute: 0.9 10*3/uL (ref 0.1–1.0)
Monocytes Relative: 5 %
Neutro Abs: 17.2 10*3/uL — ABNORMAL HIGH (ref 1.7–7.7)
Neutrophils Relative %: 92 %
Platelets: 200 10*3/uL (ref 150–400)
RBC: 3.83 MIL/uL — ABNORMAL LOW (ref 4.22–5.81)
RDW: 14.4 % (ref 11.5–15.5)
WBC: 18.5 10*3/uL — ABNORMAL HIGH (ref 4.0–10.5)
nRBC: 0 % (ref 0.0–0.2)

## 2020-08-28 LAB — MAGNESIUM: Magnesium: 2.2 mg/dL (ref 1.7–2.4)

## 2020-08-28 LAB — GLUCOSE, CAPILLARY
Glucose-Capillary: 108 mg/dL — ABNORMAL HIGH (ref 70–99)
Glucose-Capillary: 112 mg/dL — ABNORMAL HIGH (ref 70–99)
Glucose-Capillary: 148 mg/dL — ABNORMAL HIGH (ref 70–99)
Glucose-Capillary: 109 mg/dL — ABNORMAL HIGH (ref 70–99)

## 2020-08-28 LAB — PHOSPHORUS: Phosphorus: 3.1 mg/dL (ref 2.5–4.6)

## 2020-08-28 LAB — D-DIMER, QUANTITATIVE: D-Dimer, Quant: 1.62 ug/mL-FEU — ABNORMAL HIGH (ref 0.00–0.50)

## 2020-08-28 LAB — C-REACTIVE PROTEIN: CRP: 8.4 mg/dL — ABNORMAL HIGH (ref <1.0)

## 2020-08-28 LAB — FERRITIN: Ferritin: 1419 ng/mL — ABNORMAL HIGH (ref 24–336)

## 2020-08-28 MED ORDER — INSULIN DETEMIR 100 UNIT/ML ~~LOC~~ SOLN
0.0750 [IU]/kg | Freq: Every day | SUBCUTANEOUS | Status: DC
  Administered 2020-08-29 – 2020-08-30 (×2): 6 [IU] via SUBCUTANEOUS
  Filled 2020-08-28 (×2): qty 0.06

## 2020-08-28 NOTE — Plan of Care (Signed)
  Problem: Education: Goal: Knowledge of General Education information will improve Description: Including pain rating scale, medication(s)/side effects and non-pharmacologic comfort measures Outcome: Progressing   Problem: Clinical Measurements: Goal: Will remain free from infection Outcome: Progressing  Patient with productive cough but no fever this shift. Education provided regarding care plan, assisted patient to wash face and locate belongings.

## 2020-08-28 NOTE — Progress Notes (Signed)
Subjective:   [name] is a 86 year old man with past medical history of recent COVID-19 infection for which he was admitted to the [facility] 08/18/20-08/20/20 for treatment, CKD3, diastolic CHF (last EF 50-55% 11/2016), HTN, and persistent atrial fibrillation (on Eliquis) who presented to the [facility] for evaluation of shortness of breath and admitted for Covid 19 pneumonia with superimposed bacterial pneumonia  Overnight, no acute events.  This morning, he reports he is feeling good. He is off of supplemental oxygen and sating in the high 90's. He says he only feels mild shortness of breath. He would like to go home and we discussed continuing his treatment in hospital today. We will consider discharge tomorrow if continuing to improve.   Objective:  Vital signs in last 24 hours: Vitals:   08/28/20 0900 08/28/20 0958 08/28/20 1000 08/28/20 1100  BP:   131/90   Pulse: 83  90   Resp: 13  20   Temp:  (!) 97.4 F (36.3 C)    TempSrc:  Oral    SpO2: 99%  97% 100%  Weight:      Height:       On room air during our evaluation, saturating at 97%  [employer]   08/25/20 1900  Weight: 86.2 kg    Intake/Output Summary (Last 24 hours) at 08/28/2020 1302 Last data filed at 08/28/2020 0600 Gross per 24 hour  Intake 240 ml  Output 200 ml  Net 40 ml   Physical Exam Vitals reviewed.  Constitutional:      General: He is not in acute distress.    Appearance: Normal appearance.  HENT:     Head: Normocephalic and atraumatic.     Mouth/Throat:     Mouth: Mucous membranes are moist.     Pharynx: Oropharynx is clear.  Eyes:     Extraocular Movements: Extraocular movements intact.     Conjunctiva/sclera: Conjunctivae normal.  Cardiovascular:     Rate and Rhythm: Normal rate. Rhythm irregular.     Pulses: Normal pulses.     Heart sounds: Normal heart sounds.  Pulmonary:     Effort: Pulmonary effort is normal. No respiratory distress.     Breath sounds: Normal breath sounds.   Abdominal:     General: Abdomen is flat. Bowel sounds are normal.     Palpations: Abdomen is soft.     Tenderness: There is no abdominal tenderness.  Musculoskeletal:        General: Normal range of motion.     Cervical back: Normal range of motion and neck supple.     Right lower leg: No edema.     Left lower leg: No edema.  Skin:    General: Skin is warm and dry.     Capillary Refill: Capillary refill takes less than 2 seconds.  Neurological:     General: No focal deficit present.     Mental Status: He is alert. Mental status is at baseline.  Psychiatric:        Mood and Affect: Mood normal.        Behavior: Behavior normal.        Thought Content: Thought content normal.        Judgment: Judgment normal.    CBC Latest Ref Rng & Units 08/28/2020 08/27/2020 08/26/2020  WBC 4.0 - 10.5 K/uL 18.5(H) 23.5(H) 26.7(H)  Hemoglobin 13.0 - 17.0 g/dL 11.2(L) 12.1(L) 11.7(L)  Hematocrit 39 - 52 % 35.7(L) 39.0 37.4(L)  Platelets 150 - 400 K/uL  200 211 218   CMP Latest Ref Rng & Units 08/28/2020 08/27/2020 08/26/2020  Glucose 70 - 99 mg/dL 93 030(S) 923(R)  BUN 8 - 23 mg/dL 00(T) 62(U) 63(F)  Creatinine 0.61 - 1.24 mg/dL 3.54(T) 6.25(W) 3.89(H)  Sodium 135 - 145 mmol/L 136 139 137  Potassium 3.5 - 5.1 mmol/L 4.1 3.6 3.9  Chloride 98 - 111 mmol/L 101 102 103  CO2 22 - 32 mmol/L 24 23 22   Calcium 8.9 - 10.3 mg/dL 8.1(L) 8.2(L) 8.0(L)  Total Protein 6.5 - 8.1 g/dL ) 7.3(S) 5.9(L)  Total Bilirubin 0.3 - 1.2 mg/dL 0.7 0.7 2.8(J)  Alkaline Phos 38 - 126 U/L 110 98 99  AST 15 - 41 U/L 32 15 16  ALT 0 - 44 U/L 40 19 17   Phos - 3.1 Mg - 2.2 Ferritin - 1,419 down from 1,463 D-dimer - 1.62 down from 1.71 CRP - 8.4 down from 18.2   No results found. Assessment/Plan:  Active Problems:   Pneumonia due to COVID-19 virus  [name] is a 86 year old man with past medical history of recent COVID-19 infection for which he was admitted to the [facility] 08/18/20-08/20/20 for  treatment, CKD3, diastolic CHF (last EF 50-55% 11/2016), HTN, and persistent atrial fibrillation (on Eliquis) who presented to the [facility] for evaluation of shortness of breath and admitted for Covid 19 pneumonia with superimposed bacterial pneumonia  #Acute hypoxic respiratory failure likely secondary to superimposed bacterial pneumonia on underlying COVID infection Patient tested positive for COVID-19 on 10/03. Inflammatory markers were elevated on arrival to the ED, with a procal of 3.34, slightly elevated temp of 100.5. His chest radiograph reveals bilateral pneumonia. The likely etiology of his gradual worsening of his overall condition is superimposed bacterial pneumonia in association of his COVID. He was started on vancomycin per pharmacy as well as cefepime, given his recent hospital admission making HCAP coverage necessary. As his MRSA swab was negative, vancomycin was discontinued and cefepime transitioned to Unasyn. Although he has elevated inflammatory markers, we did not initiate baricitinib in the setting of superimposed bacterial pneumonia. He is continuing to improve and saturating well on room air to 4L. If patient continues to do well, can consider transitioning to oral antibiotic regimen of augmentin. -Continue Unasyn (day 3) -Continue remdesivir (day 4/5) -Continue dexamethasone (day 4/10)  -6U Lantus, sensitive SSI -currently on room air; however patient's oxygen requirement seems to be labile -IS, flutter valve  -self prone when able  -Trend inflammatory markers daily -Monitor vitals  Persistent atrial fibrillation, chronic Patient in atrial fibrillation upon admission. Prescribed apixaban 2.5 mg twice daily for anticoagulation. -Continue Cardizem 240 mg daily -Continue Eliquis 2.5mg  twice daily  Guaiac-positive stools Patient with report of one large black stool following discharge from prior hospitalization. Fecal occult positive, however hemoglobin stable. Eliquis was held  one day, however this medication was restarted in the setting of his atrial fibrillation and hypercoagulable state secondary to COVID infection. He denies any repeat incidents during hospitalization. -Continue Eliquis 2.5mg  twice daily -CBC daily  HTN, chronic On diltiazem 240 mg daily and Lasix 40 mg daily at home. -Continue home diltiazem 240 mg daily  Heart failure with preserved ejection fraction, chronic Patient has diastolic congestive heart failure with last EF of 50-55% in January 2018. Prescribed Lasix 40 mg daily. Euvolemic on exam. -Holding home Lasix 40 mg daily -Continue Cardizem  Hyperlipidemia, chronic Patient takes pravastatin 40mg  daily -Restarted home pravastatin 40mg  daily  Diet: Carb/Renal VTE: Eliquis IVF:  None Code: Full Bowel Regimen: Miralax daily PRN  Roylene Reason, MD 08/28/2020, 1:02 PM Pager: [phone] After 5pm on weekdays and 1pm on weekends: On Call pager [phone]

## 2020-08-28 NOTE — Plan of Care (Signed)
Patient is currently on room air with O2 sats at upper 90s to 100%. Patient does have afib but it is controlled and he is taking eliquis. Bed alarm on per fall and safety precautions due to COVID-19 and patient's age. Fluid restriction being enforced and monitoring closely. Patient states that he is partially blind. In no apparent distress and no needs voiced. Very pleasant. Will continue to monitor and continue current POC.

## 2020-08-29 DIAGNOSIS — U071 COVID-19: Secondary | ICD-10-CM | POA: Diagnosis not present

## 2020-08-29 LAB — D-DIMER, QUANTITATIVE: D-Dimer, Quant: 1.93 ug/mL-FEU — ABNORMAL HIGH (ref 0.00–0.50)

## 2020-08-29 LAB — CBC WITH DIFFERENTIAL/PLATELET
Abs Immature Granulocytes: 0.14 10*3/uL — ABNORMAL HIGH (ref 0.00–0.07)
Basophils Absolute: 0 10*3/uL (ref 0.0–0.1)
Basophils Relative: 0 %
Eosinophils Absolute: 0 10*3/uL (ref 0.0–0.5)
Eosinophils Relative: 0 %
HCT: 41.1 % (ref 39.0–52.0)
Hemoglobin: 12.7 g/dL — ABNORMAL LOW (ref 13.0–17.0)
Immature Granulocytes: 1 %
Lymphocytes Relative: 2 %
Lymphs Abs: 0.4 10*3/uL — ABNORMAL LOW (ref 0.7–4.0)
MCH: 28.7 pg (ref 26.0–34.0)
MCHC: 30.9 g/dL (ref 30.0–36.0)
MCV: 93 fL (ref 80.0–100.0)
Monocytes Absolute: 0.7 10*3/uL (ref 0.1–1.0)
Monocytes Relative: 5 %
Neutro Abs: 14.7 10*3/uL — ABNORMAL HIGH (ref 1.7–7.7)
Neutrophils Relative %: 92 %
Platelets: 236 10*3/uL (ref 150–400)
RBC: 4.42 MIL/uL (ref 4.22–5.81)
RDW: 14.3 % (ref 11.5–15.5)
WBC: 15.9 10*3/uL — ABNORMAL HIGH (ref 4.0–10.5)
nRBC: 0 % (ref 0.0–0.2)

## 2020-08-29 LAB — MAGNESIUM: Magnesium: 2.2 mg/dL (ref 1.7–2.4)

## 2020-08-29 LAB — COMPREHENSIVE METABOLIC PANEL
ALT: 40 U/L (ref 0–44)
AST: 22 U/L (ref 15–41)
Albumin: 2.3 g/dL — ABNORMAL LOW (ref 3.5–5.0)
Alkaline Phosphatase: 98 U/L (ref 38–126)
Anion gap: 12 (ref 5–15)
BUN: 49 mg/dL — ABNORMAL HIGH (ref 8–23)
CO2: 25 mmol/L (ref 22–32)
Calcium: 8.3 mg/dL — ABNORMAL LOW (ref 8.9–10.3)
Chloride: 101 mmol/L (ref 98–111)
Creatinine, Ser: 1.2 mg/dL (ref 0.61–1.24)
GFR, Estimated: 52 mL/min — ABNORMAL LOW (ref >60)
Glucose, Bld: 112 mg/dL — ABNORMAL HIGH (ref 70–99)
Potassium: 3.9 mmol/L (ref 3.5–5.1)
Sodium: 138 mmol/L (ref 135–145)
Total Bilirubin: 0.6 mg/dL (ref 0.3–1.2)
Total Protein: 6.2 g/dL — ABNORMAL LOW (ref 6.5–8.1)

## 2020-08-29 LAB — C-REACTIVE PROTEIN: CRP: 5.6 mg/dL — ABNORMAL HIGH (ref <1.0)

## 2020-08-29 LAB — GLUCOSE, CAPILLARY
Glucose-Capillary: 103 mg/dL — ABNORMAL HIGH (ref 70–99)
Glucose-Capillary: 139 mg/dL — ABNORMAL HIGH (ref 70–99)
Glucose-Capillary: 197 mg/dL — ABNORMAL HIGH (ref 70–99)
Glucose-Capillary: 212 mg/dL — ABNORMAL HIGH (ref 70–99)

## 2020-08-29 LAB — FERRITIN: Ferritin: 1160 ng/mL — ABNORMAL HIGH (ref 24–336)

## 2020-08-29 LAB — PHOSPHORUS: Phosphorus: 3.3 mg/dL (ref 2.5–4.6)

## 2020-08-29 NOTE — Plan of Care (Signed)
No acute events or changes since last night when I took care of this patient. Still on room air with sats WNL in upper 90s to 100%. Patient on unasyn for BL pneumonia and is tolerating well. Son, Reita Cliche called and gave him updates to the best of my knowledge. Will continue to monitor and continue current POC.

## 2020-08-29 NOTE — Progress Notes (Signed)
Subjective:   [name] is a 86 year old man with past medical history of recent COVID-19 infection for which he was admitted to the [facility] 08/18/20-08/20/20 for treatment, CKD3, diastolic CHF (last EF 50-55% 11/2016), HTN, and persistent atrial fibrillation (on Eliquis) who presented to the [facility] for evaluation of shortness of breath and admitted for Covid 19 pneumonia with superimposed bacterial pneumonia  Overnight, no acute events.  This morning, [name] reports that he continues to feel better daily. He is interested in going home with support from his home health aides. He has no complaints today.  Objective:  Vital signs in last 24 hours: Vitals:   08/28/20 1900 08/29/20 0300 08/29/20 0700 08/29/20 0900  BP: (!) 142/81 135/78  136/85  Pulse:  82    Resp:  (!) 23    Temp: 97.7 F (36.5 C) 97.6 F (36.4 C) (!) 97.4 F (36.3 C)   TempSrc: Oral Oral Oral   SpO2: 100%     Weight:      Height:       On room air during our evaluation.  Filed Weights   08/25/20 1900  Weight: 86.2 kg    Intake/Output Summary (Last 24 hours) at 08/29/2020 1022 Last data filed at 08/29/2020 1018 Gross per 24 hour  Intake 240 ml  Output 901 ml  Net -661 ml   Physical Exam Vitals reviewed.  Constitutional:      General: He is not in acute distress.    Appearance: Normal appearance.  HENT:     Head: Normocephalic and atraumatic.     Mouth/Throat:     Mouth: Mucous membranes are moist.     Pharynx: Oropharynx is clear.  Eyes:     Extraocular Movements: Extraocular movements intact.     Conjunctiva/sclera: Conjunctivae normal.  Cardiovascular:     Rate and Rhythm: Normal rate. Rhythm irregular.     Pulses: Normal pulses.     Heart sounds: Normal heart sounds.  Pulmonary:     Effort: Pulmonary effort is normal. No respiratory distress.     Breath sounds: Normal breath sounds.  Abdominal:     General: Abdomen is flat. Bowel sounds are normal.     Palpations: Abdomen  is soft.     Tenderness: There is no abdominal tenderness.  Musculoskeletal:        General: Normal range of motion.     Cervical back: Normal range of motion and neck supple.     Right lower leg: No edema.     Left lower leg: No edema.  Skin:    General: Skin is warm and dry.     Capillary Refill: Capillary refill takes less than 2 seconds.  Neurological:     General: No focal deficit present.     Mental Status: He is alert. Mental status is at baseline.  Psychiatric:        Mood and Affect: Mood normal.        Behavior: Behavior normal.        Thought Content: Thought content normal.        Judgment: Judgment normal.    CBC Latest Ref Rng & Units 08/29/2020 08/28/2020 08/27/2020  WBC 4.0 - 10.5 K/uL 15.9(H) 18.5(H) 23.5(H)  Hemoglobin 13.0 - 17.0 g/dL 12.7(L) 11.2(L) 12.1(L)  Hematocrit 39 - 52 % 41.1 35.7(L) 39.0  Platelets 150 - 400 K/uL 236 200 211   CMP Latest Ref Rng & Units 08/29/2020 08/28/2020 08/27/2020  Glucose 70 - 99  mg/dL 505(L) 93 976(B)  BUN 8 - 23 mg/dL 34(L) 93(X) 90(W)  Creatinine 0.61 - 1.24 mg/dL 4.09 7.35(H) 2.99(M)  Sodium 135 - 145 mmol/L 138 136 139  Potassium 3.5 - 5.1 mmol/L 3.9 4.1 3.6  Chloride 98 - 111 mmol/L 101 101 102  CO2 22 - 32 mmol/L 25 24 23   Calcium 8.9 - 10.3 mg/dL 8.3(L) 8.1(L) 8.2(L)  Total Protein 6.5 - 8.1 g/dL 6.2(L) 5.5(L) 5.8(L)  Total Bilirubin 0.3 - 1.2 mg/dL 0.6 0.7 0.7  Alkaline Phos 38 - 126 U/L 98 110 98  AST 15 - 41 U/L 22 32 15  ALT 0 - 44 U/L 40 40 19   Phos - 3.3 Mg - 2.2 Ferritin - 1,160 down from 1,419 D-dimer - 1.93 up from 1.62 CRP - 5.6 down from 8.4  IMAGING: No results found.   Assessment/Plan:  Active Problems:   Pneumonia due to COVID-19 virus  [name] is a 86 year old man with past medical history of recent COVID-19 infection for which he was admitted to the [facility] 08/18/20-08/20/20 for treatment, CKD3, diastolic CHF (last EF 50-55% 11/2016), HTN, and persistent atrial fibrillation  (on Eliquis) who presented to the [facility] for evaluation of shortness of breath and admitted for Covid 19 pneumonia with superimposed bacterial pneumonia  #Acute hypoxic respiratory failure likely secondary to superimposed bacterial pneumonia on underlying COVID infection Patient tested positive for COVID-19 on 10/03. Inflammatory markers were elevated on arrival to the ED, with a procal of 3.34, slightly elevated temp of 100.5. His chest radiograph reveals bilateral pneumonia. The likely etiology of his gradual worsening of his overall condition is superimposed bacterial pneumonia in association of his COVID. He was started on vancomycin per pharmacy as well as cefepime, given his recent hospital admission making HCAP coverage necessary. As his MRSA swab was negative, vancomycin was discontinued and cefepime transitioned to Unasyn. Although he has elevated inflammatory markers, we did not initiate baricitinib in the setting of superimposed bacterial pneumonia. He is continuing to improve and saturating well on room air. As patient is doing significantly well, patient can be transitioned to oral antibiotic regimen of augmentin upon discharge to assisted living facility. Patient will have home health aide pick him up tomorrow morning between 9-10am. -Continue Unasyn (day 4) -Complete remdesivir today -Continue dexamethasone (day 5/10)  -6U Lantus, sensitive SSI -currently on room air -IS, flutter valve  -self prone when able  -Trend inflammatory markers daily -Monitor vitals -Physical and occupational therapy recommending home health PT and OT  #Persistent atrial fibrillation, chronic Patient in atrial fibrillation upon admission. Prescribed apixaban 2.5 mg twice daily for anticoagulation. -Continue Cardizem 240 mg daily -Continue Eliquis 2.5mg  twice daily  #HTN, chronic On diltiazem 240 mg daily and Lasix 40 mg daily at home. -Continue home diltiazem 240 mg daily  #Heart failure with preserved  ejection fraction, chronic Patient has diastolic congestive heart failure with last EF of 50-55% in January 2018. Prescribed Lasix 40 mg daily. Euvolemic on exam. -Holding home Lasix 40 mg daily -Continue Cardizem  #Hyperlipidemia, chronic Patient takes pravastatin 40mg  daily -Restarted home pravastatin 40mg  daily  Diet: Carb/Renal VTE: Eliquis IVF: None Code: Full Bowel Regimen: Miralax daily PRN  February 2018, MD 08/29/2020, 10:22 AM Pager: [phone] After 5pm on weekdays and 1pm on weekends: On Call pager [phone]

## 2020-08-29 NOTE — Discharge Summary (Signed)
Name: Andrew Salas MRN: [id number] DOB: 1926/11/06 86 y.o. PCP: Andrew Fusi, MD  Date of Admission: 08/25/2020 11:19 AM Date of Discharge: 08/30/2020 Attending Physician: Dr. Earl Lagos, MD   Discharge Diagnosis: 1. Active Problems:   Pneumonia due to COVID-19 virus  Discharge Medications: Allergies as of 08/30/2020   No Known Allergies      Medication List     STOP taking these medications    dexamethasone 6 MG tablet Commonly known as: DECADRON       TAKE these medications    acetaminophen 500 MG tablet Commonly known as: TYLENOL Take 500 mg by mouth at bedtime.   amoxicillin-clavulanate 875-125 MG tablet Commonly known as: Augmentin Take 1 tablet by mouth 2 (two) times daily for 3 days.   carboxymethylcellulose 0.5 % Soln Commonly known as: REFRESH PLUS Place 1 drop into both eyes 3 (three) times daily.   diclofenac sodium 1 % Gel Commonly known as: VOLTAREN Apply 2 g topically 2 (two) times daily as needed (pain).   diltiazem 240 MG 24 hr capsule Commonly known as: CARDIZEM CD Take 1 capsule (240 mg total) by mouth daily.   dorzolamide-timolol 22.3-6.8 MG/ML ophthalmic solution Commonly known as: COSOPT Place 1 drop into both eyes 2 (two) times daily.   Eliquis 5 MG Tabs tablet Generic drug: apixaban Take 2.5 mg by mouth 2 (two) times daily.   feeding supplement Liqd Take 237 mLs by mouth 2 (two) times daily between meals.   ferrous sulfate 325 (65 FE) MG tablet Commonly known as: FerrouSul Take 1 tablet (325 mg total) by mouth 3 (three) times daily with meals. What changed:   how much to take  when to take this  additional instructions   fluticasone 50 MCG/ACT nasal spray Commonly known as: FLONASE Place 1 spray into both nostrils at bedtime.   furosemide 40 MG tablet Commonly known as: LASIX Take 1 tablet (40 mg total) by mouth 2 (two) times daily. What changed: when to take this   Imodium A-D 2 MG  tablet Generic drug: loperamide Take 2 mg by mouth daily as needed for diarrhea or loose stools.   latanoprost 0.005 % ophthalmic solution Commonly known as: XALATAN Place 1 drop into both eyes at bedtime.   polyvinyl alcohol 1.4 % ophthalmic solution Commonly known as: LIQUIFILM TEARS Place 1 drop into both eyes 3 (three) times daily as needed for dry eyes.   pravastatin 40 MG tablet Commonly known as: PRAVACHOL Take 40 mg by mouth at bedtime.   PRESERVISION AREDS 2 PO Take 1 tablet by mouth 2 (two) times daily.       Disposition and follow-up:   Mr.Andrew Salas was discharged from [facility] in Stable condition.  At the hospital follow up visit please address:  1.  Patient's recovery from COVID with superimposed bacterial pneumonia. Patient's adherence to prescribed Augmentin regimen. Patient's progress with home health PT and OT.  2.  Labs / imaging needed at time of follow-up: none  3.  Pending labs/ test needing follow-up: none  Follow-up Appointments:  Follow-up Information     Andrew Fusi, MD. Schedule an appointment as soon as possible for a visit.   Specialty: Internal Medicine Contact information: [address] [place] [address] [phone]         [facility] Follow up.   Why: [employer] will continue to provide you with home health aide services at  your home through the [place]. Contact information: [phone]        [facility] Follow up.   Specialty: Home Health Services Why: [facility] will call you 24-48 hours after your discharge home to provide you with physical therapy and occupational therapy services at your home. Contact information: [address] [place] [address] [phone]                 Hospital Course by problem list: #Acute hypoxic respiratory failure likely secondary to superimposed bacterial pneumonia on underlying COVID  infection Patient tested positive for COVID-19 on 10/03 and was admitted to the [facility] from 10/03 to 10/05 and discharged home in stable condition following three days of remdesivir, decadron and supplemental oxygen. On 10/10 patient presented back to the emergency department due to worsening shortness of breath, cough and fevers. Inflammatory markers were elevated on arrival to the ED, with a procal of 3.34, slightly elevated temp of 100.5. His chest radiograph revealed bilateral pneumonia. Patient was diagnosed with COVID-19 infection complicated by superimposed bacterial pneumonia. He was started on vancomycin per pharmacy as well as cefepime, given his recent hospital admission making HCAP coverage necessary. As his MRSA swab was negative, vancomycin was discontinued and cefepime transitioned to Unasyn. Although he had elevated inflammatory markers, we did not initiate baricitinib in the setting of superimposed bacterial pneumonia. He continued to improve and saturated well on room air with ambulation. As patient was doing significantly well, patient was transitioned to oral antibiotic regimen of augmentin for three days upon discharge to assisted living facility. Patient was picked up on 10/15 and discharged to assisted living facility with home health aides and home health PT and home health OT.  #Persistent atrial fibrillation, chronic #Guaiac positive stool Patient remained in atrial fibrillation throughout entirety of hospitalization. Patient's home apixaban was held for day one of his hospitalization due to a report of one dark stool prior to arrival to the emergency department. Due to concern of a GI bleed, fecal occult blood test obtained which was positive. Patient's hemoglobin stable throughout hospitalization with no recurrence of bleeding in further bowel movements. Apixaban was restarted on day 2 of hospitalization and cardizem was continued throughout.  Discharge Vitals:   BP (!) 131/96 (BP  Location: Right Arm)   Pulse 84   Temp 97.7 F (36.5 C) (Oral)   Resp (!) 23   Ht 6' (1.829 m)   Wt 190 lb 0.6 oz (86.2 kg)   SpO2 100%   BMI 25.77 kg/m   Pertinent Labs, Studies, and Procedures:  CBC Latest Ref Rng & Units 08/30/2020 08/29/2020 08/28/2020  WBC 4.0 - 10.5 K/uL 13.8(H) 15.9(H) 18.5(H)  Hemoglobin 13.0 - 17.0 g/dL 11.5(L) 12.7(L) 11.2(L)  Hematocrit 39 - 52 % 36.1(L) 41.1 35.7(L)  Platelets 150 - 400 K/uL 197 236 200   CMP Latest Ref Rng & Units 08/30/2020 08/29/2020 08/28/2020  Glucose 70 - 99 mg/dL 578(I) 696(E) 93  BUN 8 - 23 mg/dL 95(M) 84(X) 32(G)  Creatinine 0.61 - 1.24 mg/dL 4.01(U) 2.72 5.36(U)  Sodium 135 - 145 mmol/L 134(L) 138 136  Potassium 3.5 - 5.1 mmol/L 4.2 3.9 4.1  Chloride 98 - 111 mmol/L 103 101 101  CO2 22 - 32 mmol/L 24 25 24   Calcium 8.9 - 10.3 mg/dL 7.8(L) 8.3(L) 8.1(L)  Total Protein 6.5 - 8.1 g/dL 5.3(L) 6.2(L) 5.5(L)  Total Bilirubin 0.3 - 1.2 mg/dL 0.8 0.6 0.7  Alkaline Phos 38 - 126 U/L 81 98  110  AST 15 - 41 U/L 17 22 32  ALT 0 - 44 U/L 35 40 40   Discharge Instructions: Discharge Instructions     Diet - low sodium heart healthy   Complete by: As directed    Increase activity slowly   Complete by: As directed        Signed: Roylene Reason, MD 08/30/2020, 12:37 PM   Pager: [phone]

## 2020-08-29 NOTE — Progress Notes (Signed)
Pharmacy Antibiotic Note  Andrew Salas is a 86 y.o. male admitted on 08/25/2020 with superimposed bacterial pneumonia in the setting of COVID.  Pharmacy has been consulted for ampicillin/sulbactam dosing.    Renal function stable. Possible discharge today. Team will change to amoxicillin/clavulaunate to complete course at discharge.   Plan: Unasyn 3g IV q8 F/u duration     Height: 6' (182.9 cm) Weight: 86.2 kg (190 lb 0.6 oz) IBW/kg (Calculated) : 77.6  Temp (24hrs), Avg:97.6 F (36.4 C), Min:97.4 F (36.3 C), Max:97.7 F (36.5 C)  Recent Labs  Lab 08/25/20 1137 08/26/20 0434 08/27/20 0314 08/28/20 0826 08/29/20 0819  WBC 22.3* 26.7* 23.5* 18.5* 15.9*  CREATININE 1.67* 1.53* 1.45* 1.36* 1.20  LATICACIDVEN 3.6*  --   --   --   --     Estimated Creatinine Clearance: 43.1 mL/min (by C-G formula based on SCr of 1.2 mg/dL).    No Known Allergies  Antimicrobials this admission: 10/10 Cefepime x 1 10/10 Vanc >> 10/11 10/11 Unasyn>>  Microbiology results: 10/10 BCx: ngtd  Alphia Moh, PharmD, BCPS, BCCP Clinical Pharmacist  Please check AMION for all [facility] Pharmacy phone numbers After 10:00 PM, call Main Pharmacy [phone]

## 2020-08-29 NOTE — Evaluation (Signed)
Physical Therapy Evaluation Patient Details Name: Andrew Salas MRN: [id number] DOB: 1926/04/27 Today's Date: 08/29/2020   History of Present Illness  Andrew Salas is a 86 year old man with past medical history of recent COVID-19 infection for which he was admitted to the IMTS 08/18/20-08/20/20 for treatment, CKD3, diastolic CHF (last EF 50-55% 11/2016), HTN, and persistent atrial fibrillation (on Eliquis) who presented to the [facility] for evaluation of shortness of breath and admitted for Covid 19 pneumonia with superimposed bacterial pneumonia  Clinical Impression  Pt admitted with above diagnosis. Pt presenting with mild deficits in strength, mobility, endurance, safety, and balance compared to his baseline.  Pt lives at ALF with caregivers 9-12 hr/day.  Pt has all necessary DME including electric scooter that will maneuver in home. Pt was on RA during evaluation with O2 sats 98% rest and 93% ambulation.   Pt currently with functional limitations due to the deficits listed below (see PT Problem List). Pt will benefit from skilled PT to increase their independence and safety with mobility to allow discharge to the venue listed below.       Follow Up Recommendations Home health PT;Supervision - Intermittent;Other (comment) (return to his ALF with his personal care attendants)    Equipment Recommendations  None recommended by PT    Recommendations for Other Services       Precautions / Restrictions Precautions Precautions: Fall Restrictions Weight Bearing Restrictions: No      Mobility  Bed Mobility Overal bed mobility: Needs Assistance Bed Mobility: Supine to Sit     Supine to sit: Min assist;HOB elevated     General bed mobility comments: in chair at arrival  Transfers Overall transfer level: Needs assistance Equipment used: Rolling walker (2 wheeled) Transfers: Sit to/from Stand Sit to Stand: Min guard Stand pivot transfers: Min guard       General  transfer comment: min guard for safety, cues for hand placement and increased time/effort to power up  Ambulation/Gait Ambulation/Gait assistance: Min guard Gait Distance (Feet): 36 Feet Assistive device: Rolling walker (2 wheeled) Gait Pattern/deviations: Step-to pattern;Decreased stride length;Shuffle;Trunk flexed Gait velocity: decreased   General Gait Details: Pt continues to move slow but safe.  He did fatigue easily.  Reports he is used to a rollator and this RW was hard to roll.  Stairs            Wheelchair Mobility    Modified Rankin (Stroke Patients Only)       Balance Overall balance assessment: Needs assistance Sitting-balance support: No upper extremity supported;Feet supported Sitting balance-Leahy Scale: Good     Standing balance support: Bilateral upper extremity supported;During functional activity;Single extremity supported Standing balance-Leahy Scale: Poor Standing balance comment: reliant on at least one UE support                             Pertinent Vitals/Pain Pain Assessment: No/denies pain    Home Living Family/patient expects to be discharged to:: Assisted living Living Arrangements: Other (Comment) (ALF) Available Help at Discharge: Available PRN/intermittently;Personal care attendant Type of Home: Assisted living Home Access: Elevator     Home Layout: One level Home Equipment: Insurance underwriter - 2 wheels;Walker - 4 wheels;Shower seat;Bedside commode;Grab bars - toilet;Grab bars - tub/shower Additional Comments: Has an aide that comes 9-12 each day and does cooking, cleaning.     Prior Function Level of Independence: Needs assistance   Gait / Transfers Assistance Needed: walks with  rollator indoors; uses scooter when he goes out (he drives into his [employer] and caregiver drives him).   ADL's / Homemaking Assistance Needed: reports modified independent with basic ADLs; Has an aide that comes 9-12 each day and does  cooking, cleaning.         Hand Dominance   Dominant Hand: Right    Extremity/Trunk Assessment   Upper Extremity Assessment Upper Extremity Assessment: Defer to OT evaluation    Lower Extremity Assessment Lower Extremity Assessment: LLE deficits/detail;RLE deficits/detail RLE Deficits / Details: ROM WFL; MMT 5/5 LLE Deficits / Details: ROM WFL; MMT 5/5    Cervical / Trunk Assessment Cervical / Trunk Assessment: Kyphotic  Communication   Communication: HOH  Cognition Arousal/Alertness: Awake/alert Behavior During Therapy: WFL for tasks assessed/performed Overall Cognitive Status: Within Functional Limits for tasks assessed                       Memory: Decreased short-term memory         General Comments: Very pleasant gentleman who was oriented and able to follow all commands.  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  Reports several injuries and surgeries to bil LE due to injuries from WWII.  Reports he doesn 158 exercises daily      General Comments General comments (skin integrity, edema, etc.): Pt on RA with O2 sats 98% rest and 93% with ambulation.  Pt with mild DOE.    Exercises     Assessment/Plan    PT Assessment Patient needs continued PT services  PT Problem List Decreased strength;Decreased balance;Decreased mobility;Decreased coordination;Decreased activity tolerance;Cardiopulmonary status limiting activity;Decreased knowledge of use of DME       PT Treatment Interventions Gait training;Functional mobility training;Therapeutic activities;Therapeutic exercise;Patient/family education;DME instruction;Balance training    PT Goals (Current goals can be found in the Care Plan section)  Acute Rehab PT Goals Patient Stated Goal: go home when able PT Goal Formulation: With patient Time For Goal Achievement: 09/12/20 Potential to Achieve Goals: Good    Frequency Min 3X/week   Barriers to discharge         Co-evaluation               AM-PAC PT "6 Clicks" Mobility  Outcome Measure Help needed turning from your back to your side while in a flat bed without using bedrails?: None Help needed moving from lying on your back to sitting on the side of a flat bed without using bedrails?: A Little Help needed moving to and from a bed to a chair (including a wheelchair)?: A Little Help needed standing up from a chair using your arms (e.g., wheelchair or bedside chair)?: A Little Help needed to walk in hospital room?: A Little Help needed climbing 3-5 steps with a railing? : A Lot 6 Click Score: 18    End of Session   Activity Tolerance: Patient tolerated treatment well Patient left: in chair;with call bell/phone within reach;with chair alarm set Nurse Communication: Mobility status PT Visit Diagnosis: Muscle weakness (generalized) (M62.81);Difficulty in walking, not elsewhere classified (R26.2)    Time: 1018-1040 PT Time Calculation (min) (ACUTE ONLY): 22 min   Charges:   PT Evaluation $PT Eval Moderate Complexity: 1 Andi Hence, PT Acute Rehab Services Pager [phone] [facility] [phone]    Rayetta Humphrey 08/29/2020, 10:50 AM

## 2020-08-29 NOTE — Evaluation (Signed)
Occupational Therapy Evaluation Patient Details Name: Andrew Salas MRN: [id number] DOB: 10/16/1926 Today's Date: 08/29/2020    History of Present Illness Andrew Salas is a 86 year old man with past medical history of recent COVID-19 infection for which he was admitted to the IMTS 08/18/20-08/20/20 for treatment, CKD3, diastolic CHF (last EF 09-98% 11/2016), HTN, and persistent atrial fibrillation (on Eliquis) who presented to the [facility] for evaluation of shortness of breath and admitted for Covid 19 pneumonia with superimposed bacterial pneumonia   Clinical Impression   PTA, pt lives at ALF and reports Modified Independent with ADLs and mobility using Rollator inside and electric scooter in the community. Pt has a caregiver that assists daily with IADLs. Pt presents now with diagnoses above and deficits in strength, endurance, dynamic standing balance, and cardiopulmonary tolerance. Pt requires min guard for transfers and mobility using RW, reliant on at least one UE support. Pt requires up to [place] for UB ADLs and Mod A for LB ADLs secondary to deficits. SpO2 >90% on RA throughout session though pt noted with mild SOB and increasing fatigue with activity. Recommend HHOT follow-up to maximize independence in ADLs and increase activity tolerance.     Follow Up Recommendations  Home health OT;Other (comment) (return to ALF)    Equipment Recommendations  None recommended by OT (has all needed DME)    Recommendations for Other Services       Precautions / Restrictions Precautions Precautions: Fall Restrictions Weight Bearing Restrictions: No      Mobility Bed Mobility Overal bed mobility: Needs Assistance Bed Mobility: Supine to Sit     Supine to sit: Min assist;HOB elevated     General bed mobility comments: Min A to advance trunk to EOB using rail and light therapist assist  Transfers Overall transfer level: Needs assistance Equipment used: Rolling walker (2  wheeled) Transfers: Sit to/from [facility] Sit to Stand: Min guard Stand pivot transfers: Min guard       General transfer comment: min guard for safety, cues for hand placement and increased time/effort to power up    Balance Overall balance assessment: Needs assistance Sitting-balance support: No upper extremity supported;Feet supported Sitting balance-Leahy Scale: Fair     Standing balance support: Single extremity supported;During functional activity Standing balance-Leahy Scale: Poor Standing balance comment: reliant on at least one UE support                           ADL either performed or assessed with clinical judgement   ADL Overall ADL's : Needs assistance/impaired Eating/Feeding: Set up;Sitting   Grooming: Supervision/safety;Standing   Upper Body Bathing: Supervision/ safety;Sitting   Lower Body Bathing: Minimal assistance;Sit to/from stand;Sitting/lateral leans   Upper Body Dressing : Supervision/safety;Standing Upper Body Dressing Details (indicate cue type and reason): Able to don gown in standing alternating UE support Lower Body Dressing: Sitting/lateral leans;Sit to/from stand;Moderate assistance   Toilet Transfer: Min Statistician Details (indicate cue type and reason): min guard for safety with pivot to [facility] reaching for BSC armrest, used RW to get to recliner min guard Toileting- Clothing Manipulation and Hygiene: Moderate assistance;Sit to/from stand;Sitting/lateral lean Toileting - Clothing Manipulation Details (indicate cue type and reason): Pt attempting to complete posterior hygiene leaning side to side but with hygiene concerns, so OT assisted for peri care in standing      Functional mobility during ADLs: Min guard;Rolling walker General ADL Comments: Pt with decreased endurance  impacting ability to complete ADLs at baseline     Vision Baseline Vision/History: Wears glasses;Legally  blind;Macular Degeneration Wears Glasses: At all times Patient Visual Report: No change from baseline Vision Assessment?: Vision impaired- to be further tested in functional context Additional Comments: mac degeneration, able to see long distance but not far away      Perception     Praxis      Pertinent Vitals/Pain Pain Assessment: No/denies pain     Hand Dominance Right   Extremity/Trunk Assessment Upper Extremity Assessment Upper Extremity Assessment: Overall WFL for tasks assessed   Lower Extremity Assessment Lower Extremity Assessment: Defer to PT evaluation   Cervical / Trunk Assessment Cervical / Trunk Assessment: Kyphotic   Communication Communication Communication: HOH   Cognition Arousal/Alertness: Awake/alert Behavior During Therapy: WFL for tasks assessed/performed Overall Cognitive Status: Within Functional Limits for tasks assessed                       Memory: Decreased short-term memory         General Comments: appears to be at baseline, cognition not formally assessed.   General Comments  Pt received on RA, SpO2 WFL throughout session though pt with mild exertional dyspnea and increasing fatigue    Exercises     Shoulder Instructions      Home Living Family/patient expects to be discharged to:: Assisted living                             Home Equipment: Youth worker - 2 wheels;Walker - 4 wheels;Shower seat;Bedside commode;Grab bars - toilet;Grab bars - tub/shower   Additional Comments: Has an aide that comes 9-12 each day and does cooking, cleaning.       Prior Functioning/Environment Level of Independence: Needs assistance  Gait / Transfers Assistance Needed: walks with rollator indoors; uses scooter when he goes out (he drives into his [employer]i and caregiver drives him).  ADL's / Homemaking Assistance Needed: reports modified independent with basic ADLs; Has an aide that comes 9-12 each day and does cooking,  cleaning.  Communication / Swallowing Assistance Needed: HOH          OT Problem List: Decreased activity tolerance;Cardiopulmonary status limiting activity;Decreased strength;Impaired balance (sitting and/or standing)      OT Treatment/Interventions: Therapeutic exercise;Self-care/ADL training;Energy conservation;DME and/or AE instruction;Therapeutic activities;Patient/family education    OT Goals(Current goals can be found in the care plan section) Acute Rehab OT Goals Patient Stated Goal: go home when able OT Goal Formulation: With patient Time For Goal Achievement: 09/12/20 Potential to Achieve Goals: Good ADL Goals Pt Will Perform Grooming: with modified independence;standing Pt Will Perform Lower Body Bathing: with modified independence;sit to/from stand Pt Will Perform Lower Body Dressing: with modified independence;sit to/from stand;sitting/lateral leans Pt Will Perform Toileting - Clothing Manipulation and hygiene: with modified independence;sit to/from stand;sitting/lateral leans Additional ADL Goal #1: Pt to verbalize at least 2 energy conservation strategies to implement during ADLs  OT Frequency: Min 2X/week   Barriers to D/C:            Co-evaluation              AM-PAC OT "6 Clicks" Daily Activity     Outcome Measure Help from another person eating meals?: A Little Help from another person taking care of personal grooming?: A Little Help from another person toileting, which includes using toliet, bedpan, or urinal?: A Lot Help from another person  bathing (including washing, rinsing, drying)?: A Little Help from another person to put on and taking off regular upper body clothing?: A Little Help from another person to put on and taking off regular lower body clothing?: A Lot 6 Click Score: 16   End of Session Equipment Utilized During Treatment: Rolling walker Nurse Communication: Mobility status;Other (comment) (sacral bandage removed for peri  care)  Activity Tolerance: Patient tolerated treatment well Patient left: Other (comment) (walking with PT)  OT Visit Diagnosis: Other abnormalities of gait and mobility (R26.89);Unsteadiness on feet (R26.81);Muscle weakness (generalized) (M62.81);Other (comment) (decreased cardiopulmonary tolerance)                Time: 6578-4696 OT Time Calculation (min): 32 min Charges:  OT General Charges $OT Visit: 1 Visit OT Evaluation $OT Eval Moderate Complexity: 1 Mod OT Treatments $Self Care/Home Management : 8-22 mins  Layla Maw, OTR/L  Layla Maw 08/29/2020, 10:39 AM

## 2020-08-29 NOTE — TOC Initial Note (Signed)
Transition of Care [facility]) - Initial/Assessment Note    Patient Details  Name: Andrew Salas MRN: [id number] Date of Birth: 1926-04-23  Transition of [facility] CM/SW Contact:    Janae Bridgeman, RN Phone Number: 08/29/2020, 1:49 PM  Clinical Narrative:                 Case management spoke with the patient on the phone regarding transitions of care to home.  The patient lives at home alone but has assistance through home health aides provided through [facility] [employer] on [place], [place], Thursday and Friday from 9 am til 12 noon.  I called and spoke with [employer] and they are able to provide Andrew Salas, aide tomorrow for discharge transportation home and care starting tomorrow - Andrew Salas [phone].  She will pick the patient up in the morning on 10/15 to take him home around 9 am to 10 am tomorrow.  I called and spoke with the [place] - they were notified of the patient's admission when I called them on 08/27/2020.  I talked with Fredderick Phenix, MSW on the phone today and she is aware that patient will need Home Health PT/OT and prefers [facility].  I called and spoke with Kandee Keen at [facility] and made them aware.  I faxed clinicals to the [place] at fax # [phone] for home health orders and PT/OT eval.  I will continue to follow the patient for discharge needs.  Expected Discharge Plan: Home w Home Health Services Barriers to Discharge: Family Issues (Patient lives alone - Will discharge home with [facility] aide on 08/30/2020)   Patient Goals and CMS Choice Patient states their goals for this hospitalization and ongoing recovery are:: Patient plans to discharge home with home health tomorrow. CMS Medicare.gov Compare Post Acute Care list provided to:: Patient Choice offered to / list presented to : Patient  Expected Discharge Plan and Services Expected Discharge Plan: Home w Home Health Services   Discharge Planning Services: CM Consult Post Acute Care Choice:  Home Health Living arrangements for the past 2 months: Apartment                           HH Arranged: PT, OT HH Agency: [facility] Date [facility] Agency Contacted: 08/29/20 Time HH Agency Contacted: 1346 Representative spoke with at [facility] Agency: Mallie Snooks, CSW with [place] [phone]  Prior Living Arrangements/Services Living arrangements for the past 2 months: Apartment Lives with:: Self Patient language and need for interpreter reviewed:: Yes Do you feel safe going back to the place where you live?: Yes      Need for Family Participation in Patient Care: Yes (Comment) Care giver support system in place?: Yes (comment) Current home services: Home OT, Home PT (currently has aide through [place] - [employer] [phone].) Criminal Activity/Legal Involvement Pertinent to Current Situation/Hospitalization: No - Comment as needed  Activities of Daily Living Home Assistive Devices/Equipment: None ADL Screening (condition at time of admission) Patient's cognitive ability adequate to safely complete daily activities?: Yes Is the patient deaf or have difficulty hearing?: No Does the patient have difficulty seeing, even when wearing glasses/contacts?: No Does the patient have difficulty concentrating, remembering, or making decisions?: No Patient able to express need for assistance with ADLs?: Yes Does the patient have difficulty dressing or bathing?: No Independently performs ADLs?: Yes (appropriate for developmental age) Communication: Independent  Dressing (OT): Independent Grooming: Independent Feeding: Independent Bathing: Appropriate for developmental age, Needs assistance Is this a change from baseline?: Pre-admission baseline Toileting: Independent, Needs assistance Is this a change from baseline?: Change from baseline, expected to last <3 days In/Out Bed: Needs assistance Is this a change from baseline?: Change from baseline, expected to last <3  days Walks in Home: Independent with device (comment) Is this a change from baseline?: Change from baseline, expected to last <3 days Does the patient have difficulty walking or climbing stairs?: Yes Weakness of Legs: Both Weakness of Arms/Hands: None  Permission Sought/Granted Permission sought to share information with : Case Manager Permission granted to share information with : Yes, Verbal Permission Granted     Permission granted to share info w [facility]ENC[facility]: [facility] [place] [place] [phone], Andrew Seashore, CNA - [phone]  Permission granted to share info w Relationship: family contacts in [place]     Emotional Assessment   Attitude/Demeanor/Rapport: Engaged Affect (typically observed): Accepting Orientation: : Oriented to Self, Oriented to Place, Oriented to  Time, Oriented to Situation Alcohol / Substance Use: Not Applicable Psych Involvement: No (comment)  Admission diagnosis:  Hypoxemia [R09.02] Gastrointestinal hemorrhage, unspecified gastrointestinal hemorrhage type [K92.2] Community acquired pneumonia, unspecified laterality [J18.9] Sepsis, due to unspecified organism, unspecified whether acute organ dysfunction present (HCC) [A41.9] Pneumonia due to COVID-19 virus [U07.1, J12.82] COVID-19 [U07.1] Patient Active Problem List   Diagnosis Date Noted  . Pneumonia due to COVID-19 virus 08/25/2020  . COVID-19 08/18/2020  . CKD (chronic kidney disease) 12/24/2016  . Diastolic congestive heart failure (HCC) 12/24/2016  . Cardiomyopathy (HCC) 12/16/2016  . Hypertensive heart disease with heart failure (HCC) 12/16/2016  . 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 Near syncope 11/28/2016  . Hematoma of right flank 11/28/2016  . Hyperlipidemia 11/28/2016  . Abscess in epidural space of lumbar spine 11/17/2016  . Abnormal CXR   . Discitis   . PNA (pneumonia)   . Meningitis 11/11/2016  . Permanent atrial fibrillation (HCC) 11/11/2016  .  Obesity hypoventilation syndrome (HCC), presumed 11/11/2016  . UTI (urinary tract infection) 11/11/2016  . Sepsis (HCC) 11/11/2016  . Osteoarthritis of right knee 02/05/2012    Class: End Stage   PCP:  Paulina Fusi, MD Pharmacy:   [facility] [phone] - [place] - [address] [facility] [place] [address] Phone: [phone] Fax: [phone]  [facility] - [place], [place] - [address] [phone] [facility] [place] [address] [address] Phone: [phone] Fax: [phone]  [facility] [place], [place] - [address] [place] [address] Phone: [phone] Fax: [phone]     Social Determinants of Health (SDOH) Interventions    Readmission Risk Interventions Readmission Risk Prevention Plan 08/29/2020 08/26/2020 08/19/2020  Transportation Screening Complete Complete Complete  PCP or Specialist Appt within 5-7 Days Complete - Complete  PCP or Specialist Appt within 3-5 Days - Complete -  Home Care Screening Complete - Complete  Medication Review (RN CM) Complete - Complete  HRI or Home Care Consult - Complete -  Social Work Consult for Recovery Care Planning/Counseling - Complete -  Palliative Care Screening - Not Applicable -  Medication Review (RN Care Manager) - Complete -  Some recent data might be hidden

## 2020-08-30 DIAGNOSIS — U071 COVID-19: Secondary | ICD-10-CM | POA: Diagnosis not present

## 2020-08-30 LAB — COMPREHENSIVE METABOLIC PANEL
ALT: 35 U/L (ref 0–44)
AST: 17 U/L (ref 15–41)
Albumin: 2 g/dL — ABNORMAL LOW (ref 3.5–5.0)
Alkaline Phosphatase: 81 U/L (ref 38–126)
Anion gap: 7 (ref 5–15)
BUN: 50 mg/dL — ABNORMAL HIGH (ref 8–23)
CO2: 24 mmol/L (ref 22–32)
Calcium: 7.8 mg/dL — ABNORMAL LOW (ref 8.9–10.3)
Chloride: 103 mmol/L (ref 98–111)
Creatinine, Ser: 1.29 mg/dL — ABNORMAL HIGH (ref 0.61–1.24)
GFR, Estimated: 48 mL/min — ABNORMAL LOW (ref >60)
Glucose, Bld: 137 mg/dL — ABNORMAL HIGH (ref 70–99)
Potassium: 4.2 mmol/L (ref 3.5–5.1)
Sodium: 134 mmol/L — ABNORMAL LOW (ref 135–145)
Total Bilirubin: 0.8 mg/dL (ref 0.3–1.2)
Total Protein: 5.3 g/dL — ABNORMAL LOW (ref 6.5–8.1)

## 2020-08-30 LAB — CULTURE, BLOOD (ROUTINE X 2)
Culture: NO GROWTH
Culture: NO GROWTH
Special Requests: ADEQUATE
Special Requests: ADEQUATE

## 2020-08-30 LAB — FERRITIN: Ferritin: 955 ng/mL — ABNORMAL HIGH (ref 24–336)

## 2020-08-30 LAB — CBC
HCT: 36.1 % — ABNORMAL LOW (ref 39.0–52.0)
Hemoglobin: 11.5 g/dL — ABNORMAL LOW (ref 13.0–17.0)
MCH: 29.7 pg (ref 26.0–34.0)
MCHC: 31.9 g/dL (ref 30.0–36.0)
MCV: 93.3 fL (ref 80.0–100.0)
Platelets: 197 10*3/uL (ref 150–400)
RBC: 3.87 MIL/uL — ABNORMAL LOW (ref 4.22–5.81)
RDW: 14.5 % (ref 11.5–15.5)
WBC: 13.8 10*3/uL — ABNORMAL HIGH (ref 4.0–10.5)
nRBC: 0 % (ref 0.0–0.2)

## 2020-08-30 LAB — D-DIMER, QUANTITATIVE: D-Dimer, Quant: 2.09 ug/mL-FEU — ABNORMAL HIGH (ref 0.00–0.50)

## 2020-08-30 LAB — C-REACTIVE PROTEIN: CRP: 3.1 mg/dL — ABNORMAL HIGH (ref <1.0)

## 2020-08-30 LAB — MAGNESIUM: Magnesium: 2.2 mg/dL (ref 1.7–2.4)

## 2020-08-30 LAB — GLUCOSE, CAPILLARY: Glucose-Capillary: 122 mg/dL — ABNORMAL HIGH (ref 70–99)

## 2020-08-30 LAB — PHOSPHORUS: Phosphorus: 3 mg/dL (ref 2.5–4.6)

## 2020-08-30 MED ORDER — AMOXICILLIN-POT CLAVULANATE 875-125 MG PO TABS: 1.0000 | ORAL_TABLET | Freq: Two times a day (BID) | ORAL | 0 refills | Status: AC

## 2020-08-30 NOTE — Plan of Care (Signed)
No acute events or changes since the previous night that I took care of this patient. Still on room air with O2 sats in the upper 90s to 100%. Patient is excited to be going home. No apparent distress or needs voiced. Will continue to monitor and continue current POC.

## 2020-08-30 NOTE — Progress Notes (Signed)
Subjective:   [name] is a 86 year old man with past medical history of recent COVID-19 infection for which he was admitted to the [facility] 08/18/20-08/20/20 for treatment, CKD3, diastolic CHF (last EF 50-55% 11/2016), HTN, and persistent atrial fibrillation (on Eliquis) who presented to the [facility] for evaluation of shortness of breath and admitted for Covid 19 pneumonia with superimposed bacterial pneumonia  Overnight, no acute events.  This morning, he reports that he feels great and is looking forward to going home. He denies chest pain, shortness of breath, cough, fevers, chills. He states that he has an appetite and denies nausea, vomiting, abdominal pain.  Objective:  Vital signs in last 24 hours: Vitals:   08/29/20 0700 08/29/20 0900 08/29/20 2035 08/30/20 0533  BP:  136/85 111/77 (!) 131/96  Pulse:   62 84  Resp:      Temp: (!) 97.4 F (36.3 C)  (!) 97.5 F (36.4 C) 97.7 F (36.5 C)  TempSrc: Oral  Oral Oral  SpO2:   97% 100%  Weight:      Height:       On room air during our evaluation.  Filed Weights   08/25/20 1900  Weight: 86.2 kg    Intake/Output Summary (Last 24 hours) at 08/30/2020 0656 Last data filed at 08/30/2020 0537 Gross per 24 hour  Intake 342 ml  Output 1001 ml  Net -659 ml   Physical Exam Vitals reviewed.  Constitutional:      General: He is not in acute distress.    Appearance: Normal appearance.  HENT:     Head: Normocephalic and atraumatic.     Mouth/Throat:     Mouth: Mucous membranes are moist.     Pharynx: Oropharynx is clear.  Eyes:     Extraocular Movements: Extraocular movements intact.     Conjunctiva/sclera: Conjunctivae normal.  Cardiovascular:     Rate and Rhythm: Normal rate. Rhythm irregular.     Pulses: Normal pulses.     Heart sounds: Normal heart sounds.  Pulmonary:     Effort: Pulmonary effort is normal. No respiratory distress.     Breath sounds: Normal breath sounds.  Abdominal:     General: Abdomen  is flat. Bowel sounds are normal.     Palpations: Abdomen is soft.     Tenderness: There is no abdominal tenderness.  Musculoskeletal:        General: Normal range of motion.     Cervical back: Normal range of motion and neck supple.     Right lower leg: No edema.     Left lower leg: No edema.  Skin:    General: Skin is warm and dry.     Capillary Refill: Capillary refill takes less than 2 seconds.  Neurological:     General: No focal deficit present.     Mental Status: He is alert. Mental status is at baseline.  Psychiatric:        Mood and Affect: Mood normal.        Behavior: Behavior normal.        Thought Content: Thought content normal.        Judgment: Judgment normal.    CBC Latest Ref Rng & Units 08/30/2020 08/29/2020 08/28/2020  WBC 4.0 - 10.5 K/uL 13.8(H) 15.9(H) 18.5(H)  Hemoglobin 13.0 - 17.0 g/dL 11.5(L) 12.7(L) 11.2(L)  Hematocrit 39 - 52 % 36.1(L) 41.1 35.7(L)  Platelets 150 - 400 K/uL 197 236 200   CMP Latest Ref Rng & Units  08/30/2020 08/29/2020 08/28/2020  Glucose 70 - 99 mg/dL 409(B) 353(G) 93  BUN 8 - 23 mg/dL 99(M) 42(A) 83(M)  Creatinine 0.61 - 1.24 mg/dL 1.96(Q) 2.29 7.98(X)  Sodium 135 - 145 mmol/L 134(L) 138 136  Potassium 3.5 - 5.1 mmol/L 4.2 3.9 4.1  Chloride 98 - 111 mmol/L 103 101 101  CO2 22 - 32 mmol/L 24 25 24   Calcium 8.9 - 10.3 mg/dL 7.8(L) 8.3(L) 8.1(L)  Total Protein 6.5 - 8.1 g/dL 5.3(L) 6.2(L) 5.5(L)  Total Bilirubin 0.3 - 1.2 mg/dL 0.8 0.6 0.7  Alkaline Phos 38 - 126 U/L 81 98 110  AST 15 - 41 U/L 17 22 32  ALT 0 - 44 U/L 35 40 40   Phos - 3.0 Mg - 2.2 Ferritin - 955 down from 1,160; 1,419 D-dimer - 2.09 up from 1.93; 1.62 CRP - 3.1 down from 5.6; 8.4  IMAGING: No results found.   Assessment/Plan:  Active Problems:   Pneumonia due to COVID-19 virus  [name] is a 86 year old man with past medical history of recent COVID-19 infection for which he was admitted to the [facility] 08/18/20-08/20/20 for treatment, CKD3,  diastolic CHF (last EF 50-55% 11/2016), HTN, and persistent atrial fibrillation (on Eliquis) who presented to the [facility] for evaluation of shortness of breath and admitted for Covid 19 pneumonia with superimposed bacterial pneumonia  #Acute hypoxic respiratory failure likely secondary to superimposed bacterial pneumonia on underlying COVID infection Patient tested positive for COVID-19 on 10/03. Inflammatory markers were elevated on arrival to the ED, with a procal of 3.34, slightly elevated temp of 100.5. His chest radiograph reveals bilateral pneumonia. The likely etiology of his gradual worsening of his overall condition is superimposed bacterial pneumonia in association of his COVID. He was started on vancomycin per pharmacy as well as cefepime, given his recent hospital admission making HCAP coverage necessary. As his MRSA swab was negative, vancomycin was discontinued and cefepime transitioned to Unasyn. Although he has elevated inflammatory markers, we did not initiate baricitinib in the setting of superimposed bacterial pneumonia. He continued to improve and saturated well on room air with ambulation. As patient is doing significantly well, patient can be transitioned to oral antibiotic regimen of augmentin for three days upon discharge to assisted living facility. Patient will have home health aide pick him up. -Discontinue Unasyn, transition to Augmentin for three days -Completed remdesivir -Completed dexamethasone -currently on room air -IS, flutter valve  -self prone when able  -Trend inflammatory markers daily -Monitor vitals -Physical and occupational therapy recommending home health PT and OT  #Persistent atrial fibrillation, chronic Patient in atrial fibrillation upon admission. Prescribed apixaban 2.5 mg twice daily for anticoagulation. -Continue Cardizem 240 mg daily -Continue Eliquis 2.5mg  twice daily  #HTN, chronic On diltiazem 240 mg daily and Lasix 40 mg daily at  home. -Continue home diltiazem 240 mg daily  #Heart failure with preserved ejection fraction, chronic Patient has diastolic congestive heart failure with last EF of 50-55% in January 2018. Prescribed Lasix 40 mg daily. Euvolemic on exam. -Holding home Lasix 40 mg daily -Continue Cardizem  #Hyperlipidemia, chronic Patient takes pravastatin 40mg  daily -Restarted home pravastatin 40mg  daily  Diet: Carb/Renal VTE: Eliquis IVF: None Code: Full Bowel Regimen: Miralax daily PRN  February 2018, MD 08/30/2020, 6:56 AM Pager: [phone] After 5pm on weekdays and 1pm on weekends: On Call pager [phone]

## 2020-08-30 NOTE — Discharge Instructions (Signed)
[name],  It was a pleasure meeting you during your recent hospitalization. You were hospitalized for COVID-19 infection with superimposed bacterial pneumonia requiring antibiotics, steroids, and antiviral medication. You improved gradually over the few days you were here and you are stable for discharge back to your assisted living facility with home health physical and occupational therapy services. We will discharge you with three additional days of antibiotics which you need to take twice a day, starting this evening, until the medication runs out. Please, follow-up closely with your primary care physician for hospital follow-up. We are glad that you are doing well!  Sincerely, Dr. Jasmine December, MD   10 Things You Can Do to Manage Your COVID-19 Symptoms at Home If you have possible or confirmed COVID-19: 1. Stay home from work and school. And stay away from other public places. If you must go out, avoid using any kind of public transportation, ridesharing, or taxis. 2. Monitor your symptoms carefully. If your symptoms get worse, call your healthcare provider immediately. 3. Get rest and stay hydrated. 4. If you have a medical appointment, call the healthcare provider ahead of time and tell them that you have or may have COVID-19. 5. For medical emergencies, call 911 and notify the dispatch personnel that you have or may have COVID-19. 6. Cover your cough and sneezes with a tissue or use the inside of your elbow. 7. Wash your hands often with soap and water for at least 20 seconds or clean your hands with an alcohol-based hand sanitizer that contains at least 60% alcohol. 8. As much as possible, stay in a specific room and away from other people in your home. Also, you should use a separate bathroom, if available. If you need to be around other people in or outside of the home, wear a mask. 9. Avoid sharing personal items with other people in your household, like dishes, towels, and  bedding. 10. Clean all surfaces that are touched often, like counters, tabletops, and doorknobs. Use household cleaning sprays or wipes according to the label instructions. [email or web address] 05/17/2019 This information is not intended to replace advice given to you by your health care provider. Make sure you discuss any questions you have with your health care provider. Document Revised: 10/19/2019 Document Reviewed: 10/19/2019 Elsevier Patient Education  2020 [employer].

## 2020-08-30 NOTE — TOC Transition Note (Signed)
Transition of Care [facility]) - CM/SW Discharge Note   Patient Details  Name: Thijs Brunton MRN: [id number] Date of Birth: [birth year]  [facility]) CM/SW Contact:  Janae Bridgeman, RN Phone Number: 08/30/2020, 11:00 AM   Clinical Narrative:    Case management spoke with the son this morning to notify him that the patient was being discharged home today with provided care through the [facility].  She nursing assistant, Tamera Punt, is providing transportation for him to his home and will make sure the patient has groceries and essentials for care for himself over the weekend.  [facility] will be starting with the patient to provide home health PT/OT and clinicals were sent to the [place] regarding home health orders and PT/OT evaluations.  Patient discharging with the nursing assistant home today.  I called the son, Tobby on the phone and he is aware of the patient's discharge to home and possible start of home health on Monday.   Final next level of care: Home w Home Health Services Barriers to Discharge: Family Issues (Patient lives alone - Will discharge home with [facility] aide on 08/30/2020)   Patient Goals and CMS Choice Patient states their goals for this hospitalization and ongoing recovery are:: Patient plans to discharge home with home health tomorrow. CMS Medicare.gov Compare Post Acute Care list provided to:: Patient Choice offered to / list presented to : Patient  Discharge Placement                       Discharge Plan and Services   Discharge Planning Services: CM Consult Post Acute Care Choice: Home Health                    HH Arranged: PT, OT [facility] Agency: [facility] Date [facility] Agency Contacted: 08/29/20 Time HH Agency Contacted: 1346 Representative spoke with at [facility] Agency: Mallie Snooks, CSW with [place] - [phone]  Social Determinants of Health (SDOH) Interventions     Readmission Risk  Interventions Readmission Risk Prevention Plan 08/29/2020 08/26/2020 08/19/2020  Transportation Screening Complete Complete Complete  PCP or Specialist Appt within 5-7 Days Complete - Complete  PCP or Specialist Appt within 3-5 Days - Complete -  Home Care Screening Complete - Complete  Medication Review (RN CM) Complete - Complete  HRI or Home Care Consult - Complete -  Social Work Consult for Recovery Care Planning/Counseling - Complete -  Palliative Care Screening - Not Applicable -  Medication Review Oceanographer) - Complete -  Some recent data might be hidden

## 2020-08-30 NOTE — Progress Notes (Signed)
Pt is ready for discharge. Discharge instructions and AVS given to patient. Pt vocalizes understanding of discharge education. Pt was removed from telemetry, IVs removed, pt care giver contacted for transport home. Vitals are stable and the patient has no complaints or further questions at this time.

## 2020-09-23 DIAGNOSIS — I10 Essential (primary) hypertension: Secondary | ICD-10-CM | POA: Diagnosis not present

## 2020-09-23 DIAGNOSIS — I4891 Unspecified atrial fibrillation: Secondary | ICD-10-CM | POA: Diagnosis not present

## 2020-09-23 DIAGNOSIS — J80 Acute respiratory distress syndrome: Secondary | ICD-10-CM | POA: Diagnosis not present

## 2020-09-23 DIAGNOSIS — Z87440 Personal history of urinary (tract) infections: Secondary | ICD-10-CM | POA: Diagnosis not present

## 2020-09-23 DIAGNOSIS — R0603 Acute respiratory distress: Secondary | ICD-10-CM | POA: Diagnosis not present

## 2020-09-23 DIAGNOSIS — R0600 Dyspnea, unspecified: Secondary | ICD-10-CM | POA: Diagnosis not present

## 2020-09-23 DIAGNOSIS — M7989 Other specified soft tissue disorders: Secondary | ICD-10-CM | POA: Diagnosis not present

## 2020-09-23 DIAGNOSIS — I517 Cardiomegaly: Secondary | ICD-10-CM | POA: Diagnosis not present

## 2020-09-23 DIAGNOSIS — R069 Unspecified abnormalities of breathing: Secondary | ICD-10-CM | POA: Diagnosis not present

## 2020-09-23 DIAGNOSIS — N179 Acute kidney failure, unspecified: Secondary | ICD-10-CM | POA: Diagnosis not present

## 2020-09-23 DIAGNOSIS — E7800 Pure hypercholesterolemia, unspecified: Secondary | ICD-10-CM | POA: Diagnosis not present

## 2020-09-23 DIAGNOSIS — E785 Hyperlipidemia, unspecified: Secondary | ICD-10-CM | POA: Diagnosis not present

## 2020-09-23 DIAGNOSIS — K449 Diaphragmatic hernia without obstruction or gangrene: Secondary | ICD-10-CM | POA: Diagnosis not present

## 2020-09-23 DIAGNOSIS — Z87828 Personal history of other (healed) physical injury and trauma: Secondary | ICD-10-CM | POA: Diagnosis not present

## 2020-09-23 DIAGNOSIS — E872 Acidosis: Secondary | ICD-10-CM | POA: Diagnosis not present

## 2020-09-23 DIAGNOSIS — L03119 Cellulitis of unspecified part of limb: Secondary | ICD-10-CM | POA: Diagnosis not present

## 2020-09-23 DIAGNOSIS — B9689 Other specified bacterial agents as the cause of diseases classified elsewhere: Secondary | ICD-10-CM | POA: Diagnosis not present

## 2020-09-23 DIAGNOSIS — Z87891 Personal history of nicotine dependence: Secondary | ICD-10-CM | POA: Diagnosis not present

## 2020-09-23 DIAGNOSIS — J9811 Atelectasis: Secondary | ICD-10-CM | POA: Diagnosis not present

## 2020-09-23 DIAGNOSIS — Z66 Do not resuscitate: Secondary | ICD-10-CM | POA: Diagnosis not present

## 2020-09-23 DIAGNOSIS — R6520 Severe sepsis without septic shock: Secondary | ICD-10-CM | POA: Diagnosis not present

## 2020-09-23 DIAGNOSIS — A419 Sepsis, unspecified organism: Secondary | ICD-10-CM | POA: Diagnosis not present

## 2020-09-23 DIAGNOSIS — N390 Urinary tract infection, site not specified: Secondary | ICD-10-CM | POA: Diagnosis not present

## 2020-09-23 DIAGNOSIS — G9341 Metabolic encephalopathy: Secondary | ICD-10-CM | POA: Diagnosis not present

## 2020-09-23 DIAGNOSIS — Z743 Need for continuous supervision: Secondary | ICD-10-CM | POA: Diagnosis not present

## 2020-09-23 DIAGNOSIS — I499 Cardiac arrhythmia, unspecified: Secondary | ICD-10-CM | POA: Diagnosis not present

## 2020-09-23 DIAGNOSIS — M7122 Synovial cyst of popliteal space [Baker], left knee: Secondary | ICD-10-CM | POA: Diagnosis not present

## 2020-09-23 DIAGNOSIS — L03116 Cellulitis of left lower limb: Secondary | ICD-10-CM | POA: Diagnosis not present

## 2020-09-23 DIAGNOSIS — Z79899 Other long term (current) drug therapy: Secondary | ICD-10-CM | POA: Diagnosis not present

## 2020-09-23 DIAGNOSIS — N3000 Acute cystitis without hematuria: Secondary | ICD-10-CM | POA: Diagnosis not present

## 2020-09-23 DIAGNOSIS — R600 Localized edema: Secondary | ICD-10-CM | POA: Diagnosis not present

## 2020-09-23 DIAGNOSIS — L03115 Cellulitis of right lower limb: Secondary | ICD-10-CM | POA: Diagnosis not present

## 2020-09-23 DIAGNOSIS — L98499 Non-pressure chronic ulcer of skin of other sites with unspecified severity: Secondary | ICD-10-CM | POA: Diagnosis not present

## 2020-09-24 DIAGNOSIS — A419 Sepsis, unspecified organism: Secondary | ICD-10-CM | POA: Diagnosis not present

## 2020-09-24 DIAGNOSIS — I517 Cardiomegaly: Secondary | ICD-10-CM | POA: Diagnosis not present

## 2020-09-24 DIAGNOSIS — R0600 Dyspnea, unspecified: Secondary | ICD-10-CM | POA: Diagnosis not present

## 2020-09-24 DIAGNOSIS — K449 Diaphragmatic hernia without obstruction or gangrene: Secondary | ICD-10-CM | POA: Diagnosis not present

## 2020-09-24 DIAGNOSIS — N390 Urinary tract infection, site not specified: Secondary | ICD-10-CM | POA: Diagnosis not present

## 2020-09-24 DIAGNOSIS — L03119 Cellulitis of unspecified part of limb: Secondary | ICD-10-CM | POA: Diagnosis not present

## 2020-09-24 DIAGNOSIS — G9341 Metabolic encephalopathy: Secondary | ICD-10-CM | POA: Diagnosis not present

## 2020-09-24 DIAGNOSIS — J9811 Atelectasis: Secondary | ICD-10-CM | POA: Diagnosis not present

## 2020-10-16 DEATH — deceased
